# Patient Record
Sex: Female | Born: 1970 | Race: Black or African American | Hispanic: No | Marital: Married | Smoking: Never smoker
Health system: Southern US, Community
[De-identification: ages and names within clinical notes are randomized; demographics above are authoritative.]

## PROBLEM LIST (undated history)

## (undated) ENCOUNTER — Ambulatory Visit: Admission: EM | Payer: BC Managed Care – PPO | Source: Home / Self Care

## (undated) DIAGNOSIS — N7011 Chronic salpingitis: Secondary | ICD-10-CM

## (undated) DIAGNOSIS — Z8619 Personal history of other infectious and parasitic diseases: Secondary | ICD-10-CM

## (undated) DIAGNOSIS — D649 Anemia, unspecified: Secondary | ICD-10-CM

## (undated) DIAGNOSIS — R7303 Prediabetes: Secondary | ICD-10-CM

## (undated) DIAGNOSIS — Z87442 Personal history of urinary calculi: Secondary | ICD-10-CM

## (undated) DIAGNOSIS — F419 Anxiety disorder, unspecified: Secondary | ICD-10-CM

## (undated) DIAGNOSIS — N809 Endometriosis, unspecified: Secondary | ICD-10-CM

## (undated) DIAGNOSIS — E119 Type 2 diabetes mellitus without complications: Secondary | ICD-10-CM

## (undated) DIAGNOSIS — D219 Benign neoplasm of connective and other soft tissue, unspecified: Secondary | ICD-10-CM

## (undated) DIAGNOSIS — I10 Essential (primary) hypertension: Secondary | ICD-10-CM

## (undated) DIAGNOSIS — J45909 Unspecified asthma, uncomplicated: Secondary | ICD-10-CM

## (undated) DIAGNOSIS — F418 Other specified anxiety disorders: Secondary | ICD-10-CM

## (undated) DIAGNOSIS — T7840XA Allergy, unspecified, initial encounter: Secondary | ICD-10-CM

## (undated) HISTORY — DX: Anemia, unspecified: D64.9

## (undated) HISTORY — DX: Unspecified asthma, uncomplicated: J45.909

## (undated) HISTORY — PX: TONSILLECTOMY: SUR1361

## (undated) HISTORY — DX: Personal history of other infectious and parasitic diseases: Z86.19

## (undated) HISTORY — DX: Benign neoplasm of connective and other soft tissue, unspecified: D21.9

## (undated) HISTORY — DX: Essential (primary) hypertension: I10

## (undated) HISTORY — DX: Chronic salpingitis: N70.11

## (undated) HISTORY — DX: Type 2 diabetes mellitus without complications: E11.9

## (undated) HISTORY — DX: Endometriosis, unspecified: N80.9

## (undated) HISTORY — PX: POLYPECTOMY: SHX149

## (undated) HISTORY — PX: ABDOMINAL HYSTERECTOMY: SHX81

## (undated) HISTORY — DX: Anxiety disorder, unspecified: F41.9

## (undated) HISTORY — PX: TUBAL LIGATION: SHX77

## (undated) HISTORY — PX: DENTAL SURGERY: SHX609

## (undated) HISTORY — DX: Allergy, unspecified, initial encounter: T78.40XA

## (undated) HISTORY — PX: NO PAST SURGERIES: SHX2092

---

## 2006-03-17 ENCOUNTER — Ambulatory Visit: Payer: Self-pay | Admitting: Family Medicine

## 2006-04-07 ENCOUNTER — Ambulatory Visit: Payer: Self-pay | Admitting: Obstetrics and Gynecology

## 2012-11-29 ENCOUNTER — Ambulatory Visit: Payer: Self-pay | Admitting: Family Medicine

## 2012-11-30 ENCOUNTER — Emergency Department: Payer: Self-pay | Admitting: Emergency Medicine

## 2012-11-30 LAB — COMPREHENSIVE METABOLIC PANEL
Albumin: 3.4 g/dL (ref 3.4–5.0)
Alkaline Phosphatase: 100 U/L (ref 50–136)
Bilirubin,Total: 0.2 mg/dL (ref 0.2–1.0)
Calcium, Total: 8.4 mg/dL — ABNORMAL LOW (ref 8.5–10.1)
Co2: 28 mmol/L (ref 21–32)
Creatinine: 0.67 mg/dL (ref 0.60–1.30)
EGFR (Non-African Amer.): >60
Glucose: 76 mg/dL (ref 65–99)
Potassium: 3.8 mmol/L (ref 3.5–5.1)
SGOT(AST): 21 U/L (ref 15–37)
Total Protein: 7.4 g/dL (ref 6.4–8.2)

## 2012-11-30 LAB — URINALYSIS, COMPLETE
Bilirubin,UR: NEGATIVE
Blood: NEGATIVE
Nitrite: NEGATIVE
Ph: 6 (ref 4.5–8.0)
Protein: NEGATIVE
RBC,UR: 2 /HPF (ref 0–5)
Specific Gravity: 1.023 (ref 1.003–1.030)
Squamous Epithelial: 5

## 2012-11-30 LAB — CBC
HCT: 36.6 % (ref 35.0–47.0)
MCHC: 33.4 g/dL (ref 32.0–36.0)
MCV: 82 fL (ref 80–100)
Platelet: 183 10*3/uL (ref 150–440)
RDW: 14.5 % (ref 11.5–14.5)

## 2012-11-30 LAB — LIPASE, BLOOD: Lipase: 114 U/L (ref 73–393)

## 2014-01-06 ENCOUNTER — Emergency Department: Payer: Self-pay | Admitting: Emergency Medicine

## 2014-02-07 ENCOUNTER — Ambulatory Visit: Payer: Self-pay | Admitting: Family Medicine

## 2014-07-20 ENCOUNTER — Ambulatory Visit: Payer: Self-pay | Admitting: Family Medicine

## 2014-07-25 ENCOUNTER — Ambulatory Visit: Payer: Self-pay | Admitting: Family Medicine

## 2014-10-23 ENCOUNTER — Emergency Department (HOSPITAL_COMMUNITY)
Admission: EM | Admit: 2014-10-23 | Discharge: 2014-10-23 | Disposition: A | Discharge status: Home / Self Care | Payer: 59 | Source: Home / Self Care | Attending: Family Medicine | Admitting: Family Medicine

## 2014-10-23 ENCOUNTER — Encounter (HOSPITAL_COMMUNITY): Payer: Self-pay | Admitting: Emergency Medicine

## 2014-10-23 DIAGNOSIS — J301 Allergic rhinitis due to pollen: Secondary | ICD-10-CM | POA: Diagnosis not present

## 2014-10-23 LAB — POCT RAPID STREP A: Streptococcus, Group A Screen (Direct): NEGATIVE

## 2014-10-23 MED ORDER — IPRATROPIUM BROMIDE 0.06 % NA SOLN: 2.0000 | Freq: Four times a day (QID) | NASAL | Status: DC

## 2014-10-23 NOTE — Discharge Instructions (Signed)
Allergic Rhinitis Flonase nasal spray for at least 1 month Atrovent nasal spray for runny nose and sniffles Use Allegra or Zyrtec or Claritin for allergy symptoms and drainage Robitussin-DM as needed for cough Ibuprofen every 6 hours as needed for discomfort Drink plenty of fluids and stay well-hydrated Frequent use of saline nasal spray as needed. Allergic rhinitis is when the mucous membranes in the nose respond to allergens. Allergens are particles in the air that cause your body to have an allergic reaction. This causes you to release allergic antibodies. Through a chain of events, these eventually cause you to release histamine into the blood stream. Although meant to protect the body, it is this release of histamine that causes your discomfort, such as frequent sneezing, congestion, and an itchy, runny nose.  CAUSES  Seasonal allergic rhinitis (hay fever) is caused by pollen allergens that may come from grasses, trees, and weeds. Year-round allergic rhinitis (perennial allergic rhinitis) is caused by allergens such as house dust mites, pet dander, and mold spores.  SYMPTOMS   Nasal stuffiness (congestion).  Itchy, runny nose with sneezing and tearing of the eyes. DIAGNOSIS  Your health care provider can help you determine the allergen or allergens that trigger your symptoms. If you and your health care provider are unable to determine the allergen, skin or blood testing may be used. TREATMENT  Allergic rhinitis does not have a cure, but it can be controlled by:  Medicines and allergy shots (immunotherapy).  Avoiding the allergen. Hay fever may often be treated with antihistamines in pill or nasal spray forms. Antihistamines block the effects of histamine. There are over-the-counter medicines that may help with nasal congestion and swelling around the eyes. Check with your health care provider before taking or giving this medicine.  If avoiding the allergen or the medicine prescribed do  not work, there are many new medicines your health care provider can prescribe. Stronger medicine may be used if initial measures are ineffective. Desensitizing injections can be used if medicine and avoidance does not work. Desensitization is when a patient is given ongoing shots until the body becomes less sensitive to the allergen. Make sure you follow up with your health care provider if problems continue. HOME CARE INSTRUCTIONS It is not possible to completely avoid allergens, but you can reduce your symptoms by taking steps to limit your exposure to them. It helps to know exactly what you are allergic to so that you can avoid your specific triggers. SEEK MEDICAL CARE IF:   You have a fever.  You develop a cough that does not stop easily (persistent).  You have shortness of breath.  You start wheezing.  Symptoms interfere with normal daily activities. Document Released: 02/18/2001 Document Revised: 05/31/2013 Document Reviewed: 01/31/2013 [facility] Patient Information 2015 [place], [place]. This information is not intended to replace advice given to you by your health care provider. Make sure you discuss any questions you have with your health care provider.

## 2014-10-23 NOTE — ED Notes (Signed)
C/o congestion since Friday  States she has a sore throat, coughing, diarrhea x4 today, sneezing and fatigue Aleve used as tx

## 2014-10-23 NOTE — ED Provider Notes (Signed)
CSN: [id number]     Arrival date & time 10/23/14  1132 History   First MD Initiated Contact with Patient 10/23/14 1404     No chief complaint on file.  (Consider location/radiation/quality/duration/timing/severity/associated sxs/prior Treatment) HPI Comments: 44 year old female complaining of a three-day history of sore throat, PND, cough, sneezing, feeling tired and PND. She has had 4% as of diarrhea in the past 24 hours. Denies fevers.   No past medical history on file. No past surgical history on file. No family history on file. History  Substance Use Topics  . Smoking status: Not on file  . Smokeless tobacco: Not on file  . Alcohol Use: Not on file   OB History    No data available     Review of Systems  Constitutional: Positive for activity change and fatigue. Negative for fever, chills and appetite change.  HENT: Positive for congestion, postnasal drip, rhinorrhea, sneezing and sore throat. Negative for ear discharge and facial swelling.   Eyes: Negative.   Respiratory: Positive for cough. Negative for shortness of breath and wheezing.   Cardiovascular: Negative.   Gastrointestinal: Positive for diarrhea. Negative for abdominal pain.  Genitourinary: Negative.   Musculoskeletal: Negative for neck pain and neck stiffness.  Skin: Negative for rash.  Neurological: Negative.     Allergies  Review of patient's allergies indicates not on file.  Home Medications   Prior to Admission medications   Medication Sig Start Date End Date Taking? Authorizing Provider  ipratropium (ATROVENT) 0.06 % nasal spray Place 2 sprays into both nostrils 4 (four) times daily. 10/23/14   Janne Napoleon, NP   BP 128/80 mmHg  Pulse 71  Temp(Src) 98 F (36.7 C) (Oral)  Resp 16  SpO2 100% Physical Exam  Constitutional: She is oriented to person, place, and time. She appears well-developed and well-nourished. No distress.  HENT:  Bilateral TMs are normal Oropharynx with minor cobblestoning and  faint clear PND. Minimal erythema. No swelling or exudates.  Eyes: Conjunctivae and EOM are normal.  Neck: Normal range of motion. Neck supple.  Cardiovascular: Normal rate, regular rhythm and normal heart sounds.   Pulmonary/Chest: Effort normal and breath sounds normal. No respiratory distress. She has no wheezes. She has no rales.  Musculoskeletal: Normal range of motion. She exhibits no edema.  Lymphadenopathy:    She has no cervical adenopathy.  Neurological: She is alert and oriented to person, place, and time.  Skin: Skin is warm and dry. No rash noted.  Psychiatric: She has a normal mood and affect.  Nursing note and vitals reviewed.   ED Course  Procedures (including critical care time) Labs Review Labs Reviewed - No data to display  Imaging Review No results found.   MDM   1. Allergic rhinitis due to pollen    Allergic Rhinitis Flonase nasal spray for at least 1 month Atrovent nasal spray for runny nose and sniffles Use Allegra or Zyrtec or Claritin for allergy symptoms and drainage Robitussin-DM as needed for cough Ibuprofen every 6 hours as needed for discomfort Drink plenty of fluids and stay well-hydrated Frequent use of saline nasal spray as needed.      Janne Napoleon, NP 10/23/14 1428

## 2014-10-25 LAB — CULTURE, GROUP A STREP: Strep A Culture: NEGATIVE

## 2014-11-17 ENCOUNTER — Ambulatory Visit (INDEPENDENT_AMBULATORY_CARE_PROVIDER_SITE_OTHER): Payer: 59 | Admitting: Family Medicine

## 2014-11-17 ENCOUNTER — Encounter: Payer: Self-pay | Admitting: Family Medicine

## 2014-11-17 VITALS — BP 118/78 | HR 88 | Temp 98.1°F | Resp 16 | Ht 65.0 in | Wt 216.3 lb

## 2014-11-17 DIAGNOSIS — M25552 Pain in left hip: Secondary | ICD-10-CM

## 2014-11-17 DIAGNOSIS — R7309 Other abnormal glucose: Secondary | ICD-10-CM

## 2014-11-17 DIAGNOSIS — L209 Atopic dermatitis, unspecified: Secondary | ICD-10-CM | POA: Insufficient documentation

## 2014-11-17 DIAGNOSIS — F418 Other specified anxiety disorders: Secondary | ICD-10-CM | POA: Diagnosis not present

## 2014-11-17 DIAGNOSIS — R7303 Prediabetes: Secondary | ICD-10-CM | POA: Insufficient documentation

## 2014-11-17 DIAGNOSIS — M25551 Pain in right hip: Secondary | ICD-10-CM | POA: Insufficient documentation

## 2014-11-17 DIAGNOSIS — R197 Diarrhea, unspecified: Secondary | ICD-10-CM | POA: Diagnosis not present

## 2014-11-17 DIAGNOSIS — J4530 Mild persistent asthma, uncomplicated: Secondary | ICD-10-CM | POA: Diagnosis not present

## 2014-11-17 DIAGNOSIS — R7989 Other specified abnormal findings of blood chemistry: Secondary | ICD-10-CM | POA: Insufficient documentation

## 2014-11-17 MED ORDER — LOPERAMIDE HCL 2 MG PO TABS: 2.0000 mg | ORAL_TABLET | Freq: Four times a day (QID) | ORAL | Status: DC | PRN

## 2014-11-17 MED ORDER — FLUTICASONE-SALMETEROL 250-50 MCG/DOSE IN AEPB: 1.0000 | INHALATION_SPRAY | Freq: Two times a day (BID) | RESPIRATORY_TRACT | Status: DC

## 2014-11-17 MED ORDER — ALPRAZOLAM 0.5 MG PO TABS: 0.2500 mg | ORAL_TABLET | Freq: Two times a day (BID) | ORAL | Status: DC | PRN

## 2014-11-17 NOTE — Patient Instructions (Signed)

## 2014-11-17 NOTE — Progress Notes (Signed)
Name: Alisha Kim   MRN: [id number]    DOB: March 14, 1971   Date:11/17/2014       Progress Note  Subjective  Chief Complaint  Chief Complaint  Patient presents with  . Anxiety    symptoms has improved and she has doing the suggested strategies  . Diabetes    patient is pre-diabetic. patient had an episode last Thursday where her blood sugar dropped.  . Diarrhea    patient has had diarrhea for 2 weeks. Stools are really loose. Is wondering if it has something to do with her congestion/ allergies.    HPI  Gastroenteritis: Patient complains of diarrhea 3 times per day for a few weeks.  There is no report of blood in stool, dark urine, fever, melena and nausea. Patient's oral intake has been normal.  Patient's urine output has been adequate.  Other contacts with similar symptoms include no one.  Patient denies recent travel history. Patient denies recent ingestion of possible contaminated food, toxic plants, inappropriate medications/poisons.   Anxiety: Patient complains of anxiety disorder.  She has the following symptoms: none. Onset of symptoms was approximately several months ago, gradually improving since that time. She denies current suicidal and homicidal ideation. Current medications working well.  Asthma Follow-up: She has previously been evaluated here for asthma and presents for an asthma follow-up; she is not currently in exacerbation. Symptoms currently include None and occur continuously. Observed precipitants include infection and upper respiratory infection.  Current limitations in activity from asthma: none.  Number of days of school or work missed in the last month: 0. Number of Emergency Department visits in the previous month: none. Frequency of use of quick-relief meds: rarely. The patient reports adherence to this regimen    Patient Active Problem List   Diagnosis Date Noted  . Bilateral hip pain 11/17/2014  . Atopic dermatitis 11/17/2014  . Low serum vitamin D  11/17/2014    History  Substance Use Topics  . Smoking status: Never Smoker   . Smokeless tobacco: Not on file  . Alcohol Use: 0.0 oz/week    0 Standard drinks or equivalent per week     Comment: seldom use     Current outpatient prescriptions:  .  albuterol (PROAIR HFA) 108 (90 BASE) MCG/ACT inhaler, Inhale into the lungs., Disp: , Rfl:  .  escitalopram (LEXAPRO) 10 MG tablet, Take by mouth., Disp: , Rfl:  .  fluticasone-salmeterol (ADVAIR HFA) 115-21 MCG/ACT inhaler, Inhale 2 puffs into the lungs 2 (two) times daily., Disp: , Rfl:  .  HYDROcodone-homatropine (HYCODAN) 5-1.5 MG/5ML syrup, Take by mouth., Disp: , Rfl:  .  LISINOPRIL PO, Take by mouth., Disp: , Rfl:  .  lisinopril-hydrochlorothiazide (PRINZIDE,ZESTORETIC) 20-12.5 MG per tablet, Take by mouth., Disp: , Rfl:  .  Montelukast Sodium (SINGULAIR PO), Take by mouth., Disp: , Rfl:  .  fluticasone (FLONASE) 50 MCG/ACT nasal spray, INHALE 2 SPRAY BY INTRANASAL ROUTE EVERY DAY IN EACH NOSTRIL, Disp: , Rfl: 0 .  ipratropium (ATROVENT) 0.06 % nasal spray, Place 2 sprays into both nostrils 4 (four) times daily. (Patient not taking: Reported on 11/17/2014), Disp: 15 mL, Rfl: 12  History reviewed. No pertinent past surgical history.  Family History  Problem Relation Age of Onset  . Alcohol abuse Mother   . Arthritis Mother   . Asthma Mother   . Depression Mother   . Drug abuse Mother   . Hypertension Mother   . Mental illness Mother   . Alcohol abuse Father   .  Drug abuse Father   . Hypertension Brother   . Hearing loss Maternal Aunt   . Hypertension Maternal Aunt   . Stroke Maternal Aunt   . Cancer Maternal Grandmother   . Heart disease Maternal Grandmother   . Hypertension Maternal Grandmother     Allergies  Allergen Reactions  . Psudatabs [Pseudoephedrine Hcl] Swelling  . Zithromax [Azithromycin] Swelling    Tongue   . Shellfish Allergy Rash     Review of Systems  Ten systems reviewed and is negative except  as mentioned in HPI.    Objective  BP 118/78 mmHg  Pulse 88  Temp(Src) 98.1 F (36.7 C) (Oral)  Resp 16  Ht 5\' 5"  (1.651 m)  Wt 216 lb 4.8 oz (98.113 kg)  BMI 35.99 kg/m2  SpO2 97%  LMP 11/13/2014 (Approximate)  Physical Exam  Constitutional: Patient appears obese and well-nourished. In no distress.  HEENT:  - Head: Normocephalic and atraumatic.  - Ears: Bilateral TMs gray, no erythema or effusion - Nose: Nasal mucosa moist - Mouth/Throat: Oropharynx is clear and moist. No tonsillar hypertrophy or erythema. Yes post nasal drainage.  - Eyes: Conjunctivae clear, EOM movements normal. PERRLA. No scleral icterus.  Neck: Normal range of motion. Neck supple. No JVD present. No thyromegaly present.  Cardiovascular: Normal rate, regular rhythm and normal heart sounds.  No murmur heard.  Pulmonary/Chest: Effort normal and breath sounds normal. No respiratory distress. Musculoskeletal: Normal range of motion bilateral UE and LE, no joint effusions. Peripheral vascular: Bilateral LE no edema. Abdomen: Soft, NT/ND, Positive increased BS general. Skin: Skin is warm and dry. No rash noted. No erythema.  Psychiatric: Patient has a normal mood and affect. Behavior is normal in office today. Judgment and thought content normal in office today.   Recent Results (from the past 2160 hour(s))  POCT rapid strep A [facility])     Status: None   Collection Time: 10/23/14  2:21 PM  Result Value Ref Range   Streptococcus, Group A Screen (Direct) NEGATIVE NEGATIVE  Culture, Group A Strep     Status: None   Collection Time: 10/23/14  2:22 PM  Result Value Ref Range   Strep A Culture Negative     Comment: (NOTE) Performed At: [facility] [address] [place], [place] [id number] Lindon Romp MD MP:[id number]      Assessment & Plan  1. Depression with anxiety Continue current medications. Did not establish with psychologist but did get to seek counseling through  church.  - ALPRAZolam (XANAX) 0.5 MG tablet; Take 0.5 tablets (0.25 mg total) by mouth 2 (two) times daily as needed for anxiety.  Dispense: 60 tablet; Refill: 2  2. Diarrhea Likely viral in etiology. If persists will get stool studies.  - loperamide (IMODIUM A-D) 2 MG tablet; Take 1 tablet (2 mg total) by mouth 4 (four) times daily as needed for diarrhea or loose stools.  Dispense: 30 tablet; Refill: 0  3. Pre-diabetes Eat at regular intervals.  4.) Asthma, Mild Persistent Refilled Advair

## 2015-01-21 ENCOUNTER — Other Ambulatory Visit: Payer: Self-pay | Admitting: Family Medicine

## 2015-01-21 DIAGNOSIS — J309 Allergic rhinitis, unspecified: Secondary | ICD-10-CM

## 2015-01-21 DIAGNOSIS — R0982 Postnasal drip: Principal | ICD-10-CM

## 2015-02-16 ENCOUNTER — Other Ambulatory Visit: Payer: Self-pay | Admitting: Family Medicine

## 2015-02-16 DIAGNOSIS — R0982 Postnasal drip: Principal | ICD-10-CM

## 2015-02-16 DIAGNOSIS — J309 Allergic rhinitis, unspecified: Secondary | ICD-10-CM

## 2015-02-16 DIAGNOSIS — I10 Essential (primary) hypertension: Secondary | ICD-10-CM

## 2015-02-16 NOTE — Telephone Encounter (Signed)
Pt needs refill on Singulair and Lisinopril. Pt also wants to know if we can get her prescriptions transferred from [address] To [facility]. She states her [facility] insurance denied her meds due to her not using our pharmacy.Pt is going to try and get them transferred on her own but was not sure if she will be able to.

## 2015-02-16 NOTE — Telephone Encounter (Signed)
Refill request was sent to Dr. Ashany Sundaram for approval and submission.  

## 2015-02-17 MED ORDER — MONTELUKAST SODIUM 10 MG PO TABS
10.0000 mg | ORAL_TABLET | Freq: Every day | ORAL | Status: DC
Start: 2015-02-17 — End: 2015-07-24

## 2015-02-17 MED ORDER — LISINOPRIL-HYDROCHLOROTHIAZIDE 20-12.5 MG PO TABS: 1.0000 | ORAL_TABLET | Freq: Every day | ORAL | Status: DC

## 2015-03-15 ENCOUNTER — Encounter: Payer: Self-pay | Admitting: Physician Assistant

## 2015-03-15 ENCOUNTER — Ambulatory Visit: Payer: Self-pay | Admitting: Physician Assistant

## 2015-03-15 VITALS — BP 120/76 | HR 96 | Temp 98.6°F | Wt 226.0 lb

## 2015-03-15 DIAGNOSIS — J209 Acute bronchitis, unspecified: Secondary | ICD-10-CM

## 2015-03-15 MED ORDER — IPRATROPIUM-ALBUTEROL 0.5-2.5 (3) MG/3ML IN SOLN: 3.0000 mL | Freq: Once | RESPIRATORY_TRACT | Status: DC

## 2015-03-15 MED ORDER — CEFDINIR 300 MG PO CAPS: 300.0000 mg | ORAL_CAPSULE | Freq: Two times a day (BID) | ORAL | Status: DC

## 2015-03-15 MED ORDER — METHYLPREDNISOLONE 4 MG PO TBPK: ORAL_TABLET | ORAL | Status: DC

## 2015-03-15 MED ORDER — FLUCONAZOLE 150 MG PO TABS: 150.0000 mg | ORAL_TABLET | Freq: Once | ORAL | Status: DC

## 2015-03-15 NOTE — Progress Notes (Signed)
S: C/o cough and congestion with wheezing and chest pain/soreness;  chest is sore from coughing, had low grade fever, chills, and bodyaches, cough is dry and hacking; keeping pt awake at night;  denies cardiac type chest pain or sob, v/d, abd pain Remainder ros neg  O: vitals wnl, nad, tms clear, throat injected, neck supple no lymph, lungs with decreased bs b/l, cv rrr, neuro intact; svn duoneb , lungs c t a, cough is gone, pt states she feels much better  A:  Acute bronchitis   P:  rx medication: omnicef 300mg  bid x 10d, medrol dose pack, diflucan 150mg ; tussionex 143ml 63ml q 12h prn cough, nr ; use otc meds, tylenol or motrin as needed for fever/chills, return if not better in 3 -5 days, return earlier if worsening or go to acute care/ER; work note given for patient to work from home until has not had a fever for 24-48h and cough has calmed

## 2015-03-21 ENCOUNTER — Encounter: Payer: Self-pay | Admitting: Physician Assistant

## 2015-05-07 ENCOUNTER — Ambulatory Visit: Payer: Self-pay | Admitting: Physician Assistant

## 2015-05-07 ENCOUNTER — Encounter: Payer: Self-pay | Admitting: Physician Assistant

## 2015-05-07 VITALS — BP 120/72 | HR 76 | Temp 98.4°F

## 2015-05-07 DIAGNOSIS — B349 Viral infection, unspecified: Secondary | ICD-10-CM

## 2015-05-07 DIAGNOSIS — R197 Diarrhea, unspecified: Secondary | ICD-10-CM

## 2015-05-07 NOTE — Progress Notes (Signed)
S:  Pt c/o coughing and diarrhea, some bodyaches and sneezing; cold sx for 4 day, diarrhea last night and today, approx 5 times today;  no fever/chills, no abd pain except for cramping with diarrhea; denies cp/sob, denies camping, bad food, recent antibiotics, or exposure to bad water Remainder ros neg  O:  Vitals wnl, nad, ENT wnl, neck supple no lymph, lungs c t a, cv rrr, abd soft nontender bs normal all 4 quads, neuro intact  A:  Viral gastroenteritis  P:  Reassurance, fluids, brat diet, immodium ad for diarrhea if needed, return if not better in 3 days, return earlier if worsening

## 2015-06-11 DIAGNOSIS — H5213 Myopia, bilateral: Secondary | ICD-10-CM | POA: Diagnosis not present

## 2015-07-09 ENCOUNTER — Other Ambulatory Visit: Payer: Self-pay | Admitting: Family Medicine

## 2015-07-11 MED FILL — ALPRAZolam 0.5 MG TABS: 0.5 | 60 days supply | Qty: 60 | Fill #0 | Status: TO

## 2015-07-24 ENCOUNTER — Encounter: Payer: Self-pay | Admitting: Family Medicine

## 2015-07-24 ENCOUNTER — Ambulatory Visit (INDEPENDENT_AMBULATORY_CARE_PROVIDER_SITE_OTHER): Payer: 59 | Admitting: Family Medicine

## 2015-07-24 VITALS — BP 118/64 | HR 108 | Temp 98.0°F | Resp 14 | Wt 236.7 lb

## 2015-07-24 DIAGNOSIS — Z136 Encounter for screening for cardiovascular disorders: Secondary | ICD-10-CM | POA: Diagnosis not present

## 2015-07-24 DIAGNOSIS — I10 Essential (primary) hypertension: Secondary | ICD-10-CM | POA: Insufficient documentation

## 2015-07-24 DIAGNOSIS — J4530 Mild persistent asthma, uncomplicated: Secondary | ICD-10-CM

## 2015-07-24 DIAGNOSIS — R05 Cough: Secondary | ICD-10-CM

## 2015-07-24 DIAGNOSIS — R7303 Prediabetes: Secondary | ICD-10-CM | POA: Diagnosis not present

## 2015-07-24 DIAGNOSIS — Z0000 Encounter for general adult medical examination without abnormal findings: Secondary | ICD-10-CM | POA: Diagnosis not present

## 2015-07-24 DIAGNOSIS — IMO0001 Reserved for inherently not codable concepts without codable children: Secondary | ICD-10-CM | POA: Insufficient documentation

## 2015-07-24 DIAGNOSIS — Z1231 Encounter for screening mammogram for malignant neoplasm of breast: Secondary | ICD-10-CM | POA: Diagnosis not present

## 2015-07-24 DIAGNOSIS — Z13220 Encounter for screening for lipoid disorders: Secondary | ICD-10-CM

## 2015-07-24 DIAGNOSIS — Z124 Encounter for screening for malignant neoplasm of cervix: Secondary | ICD-10-CM | POA: Diagnosis not present

## 2015-07-24 DIAGNOSIS — Z113 Encounter for screening for infections with a predominantly sexual mode of transmission: Secondary | ICD-10-CM | POA: Insufficient documentation

## 2015-07-24 DIAGNOSIS — R059 Cough, unspecified: Secondary | ICD-10-CM | POA: Insufficient documentation

## 2015-07-24 MED ORDER — FLUTICASONE-SALMETEROL 250-50 MCG/DOSE IN AEPB: 1.0000 | INHALATION_SPRAY | Freq: Two times a day (BID) | RESPIRATORY_TRACT | Status: DC

## 2015-07-24 MED ORDER — ALBUTEROL SULFATE HFA 108 (90 BASE) MCG/ACT IN AERS: 1.0000 | INHALATION_SPRAY | Freq: Four times a day (QID) | RESPIRATORY_TRACT | Status: DC | PRN

## 2015-07-24 MED ORDER — MONTELUKAST SODIUM 10 MG PO TABS: 10.0000 mg | ORAL_TABLET | Freq: Every day | ORAL | Status: DC

## 2015-07-24 MED ORDER — HYDROCOD POLST-CPM POLST ER 10-8 MG/5ML PO SUER: 5.0000 mL | Freq: Two times a day (BID) | ORAL | Status: DC | PRN

## 2015-07-24 NOTE — Progress Notes (Signed)
Name: Alisha Kim   MRN: [id number]    DOB: [birth year]   Date:07/24/2015       Progress Note  Subjective  Chief Complaint  Chief Complaint  Patient presents with  . Annual Exam  . Asthma    Cough, wheezing, SOB  . Hypertension    HPI  Patient is here today for a Complete Female Physical Exam:  The patient reports having some on/off swelling in bilateral lower extremities and right hand making her ring tight (not today). Overall feels health needs are stable. Diet is well balanced, large portions, not enough water. In general does not exercise regularly. Sees dentist regularly and addresses vision concerns with ophthalmologist if applicable. In regards to sexual activity the patient is not currently sexually active. Currently is not concerned about exposure to any STDs.   Asthma Follow-up: She has previously been evaluated here for asthma and presents for an asthma follow-up; she is not currently in exacerbation but does report a nagging cough that keeps her up at night. Associated with scant white sputum production. Has run out of her Advair recently.. Observed precipitants include infection and upper respiratory infection. Current limitations in activity from asthma: none. Number of days of school or work missed in the last month: 0. Number of Emergency Department visits in the previous month: none. Frequency of use of quick-relief meds: rarely. The patient reports adherence to this regimen.   Patient is here for routine follow up of Hypertension. First diagnosed with hypertension several years ago. Current anti-hypertension medication regimen includes dietary modification, weight management and Lisinopril-HCTZ 20-12.5 mg one a day.  Patient is following physician recommended management. Not checking blood pressure outside of physician office. Associated symptoms do not include headache, dizziness, nausea, lower extremity swelling, worsening shortness of breath, chest pain,  numbness.  Past Medical History  Diagnosis Date  . Allergy   . Anemia     history of  . Anxiety   . Asthma   . Diabetes mellitus without complication ([place])     was told she was pre-diabetic  . Hypertension     No past surgical history on file.  Family History  Problem Relation Age of Onset  . Alcohol abuse Mother   . Arthritis Mother   . Asthma Mother   . Depression Mother   . Drug abuse Mother   . Hypertension Mother   . Mental illness Mother   . Alcohol abuse Father   . Drug abuse Father   . Hypertension Brother   . Hearing loss Maternal Aunt   . Hypertension Maternal Aunt   . Stroke Maternal Aunt   . Cancer Maternal Grandmother   . Heart disease Maternal Grandmother   . Hypertension Maternal Grandmother     Social History   Social History  . Marital Status: Married    Spouse Name: N/A  . Number of Children: N/A  . Years of Education: N/A   Occupational History  . Not on file.   Social History Main Topics  . Smoking status: Never Smoker   . Smokeless tobacco: Not on file  . Alcohol Use: 0.0 oz/week    0 Standard drinks or equivalent per week     Comment: seldom use  . Drug Use: No  . Sexual Activity:    Partners: Male   Other Topics Concern  . Not on file   Social History Narrative     Current outpatient prescriptions:  .  albuterol (PROAIR HFA) 108 (90 BASE) MCG/ACT inhaler,  Inhale into the lungs., Disp: , Rfl:  .  ALPRAZolam (XANAX) 0.5 MG tablet, TAKE 1/2 TABLET BY MOUTH TWICE DAILY AS NEEDED FOR ANXIETY, Disp: 60 tablet, Rfl: 3 .  Fluticasone-Salmeterol (ADVAIR DISKUS) 250-50 MCG/DOSE AEPB, Inhale 1 puff into the lungs 2 (two) times daily., Disp: 60 each, Rfl: 2 .  lisinopril-hydrochlorothiazide (PRINZIDE,ZESTORETIC) 20-12.5 MG per tablet, Take 1 tablet by mouth daily., Disp: 90 tablet, Rfl: 3 .  montelukast (SINGULAIR) 10 MG tablet, Take 1 tablet (10 mg total) by mouth daily., Disp: 90 tablet, Rfl: 3  Current facility-administered  medications:  .  ipratropium-albuterol (DUONEB) 0.5-2.5 (3) MG/3ML nebulizer solution 3 mL, 3 mL, Nebulization, Once, Versie Starks, PA-C  Allergies  Allergen Reactions  . Zithromax [Azithromycin] Swelling    Tongue   . Pseudoephedrine-Guaifenesin Swelling    Of tongue  . Shellfish Allergy Rash    ROS  CONSTITUTIONAL: No significant weight changes, fever, chills, weakness or fatigue.  HEENT:  - Eyes: No visual changes.  - Ears: No auditory changes. No pain.  - Nose: No sneezing, congestion, runny nose. - Throat: No sore throat. No changes in swallowing. SKIN: No rash or itching.  CARDIOVASCULAR: No chest pain, chest pressure or chest discomfort. No palpitations or edema.  RESPIRATORY: No shortness of breath. Yes cough GASTROINTESTINAL: No anorexia, nausea, vomiting. No changes in bowel habits. No abdominal pain or blood.  GENITOURINARY: No dysuria. No frequency. No discharge.  NEUROLOGICAL: No headache, dizziness, syncope, paralysis, ataxia, numbness or tingling in the extremities. No memory changes. No change in bowel or bladder control.  MUSCULOSKELETAL: No joint pain. No muscle pain. HEMATOLOGIC: No anemia, bleeding or bruising.  LYMPHATICS: No enlarged lymph nodes.  PSYCHIATRIC: No change in mood. No change in sleep pattern.  ENDOCRINOLOGIC: No reports of sweating, cold or heat intolerance. No polyuria or polydipsia.   Objective  Filed Vitals:   07/24/15 1402  BP: 118/64  Pulse: 108  Temp: 98 F (36.7 C)  TempSrc: Oral  Resp: 14  Weight: 236 lb 11.2 oz (107.366 kg)  SpO2: 95%   Body mass index is 39.39 kg/(m^2).  No exam data present  No results found for this or any previous visit (from the past 2160 hour(s)).  Physical Exam  Constitutional: Patient is obese and well-nourished. In no distress.  HEENT:  - Head: Normocephalic and atraumatic.  - Ears: Bilateral TMs gray, no erythema or effusion - Nose: Nasal mucosa moist - Mouth/Throat: Oropharynx is clear  and moist. No tonsillar hypertrophy or erythema. No post nasal drainage.  - Eyes: Conjunctivae clear, EOM movements normal. PERRLA. No scleral icterus.  Neck: Normal range of motion. Neck supple. No JVD present. No thyromegaly present.  Cardiovascular: Normal rate, regular rhythm and normal heart sounds.  No murmur heard.  Pulmonary/Chest: Effort normal and breath sounds normal. No respiratory distress. Abdominal: Soft. Bowel sounds are normal, no distension. There is no tenderness. no masses BREAST: Bilateral breast exam normal with no masses, skin changes or nipple discharge FEMALE GENITALIA:  External genitalia normal External urethra normal Vaginal vault normal without discharge or lesions Cervix normal without discharge or lesions Bimanual exam normal without masses RECTAL: no rectal masses or hemorrhoids Musculoskeletal: Normal range of motion bilateral UE and LE, no joint effusions. Peripheral vascular: Bilateral LE no edema. Neurological: CN II-XII grossly intact with no focal deficits. Alert and oriented to person, place, and time. Coordination, balance, strength, speech and gait are normal.  Skin: Skin is warm and dry. No rash noted. No  erythema.  Psychiatric: Patient has a normal mood and affect. Behavior is normal in office today. Judgment and thought content normal in office today.   Assessment & Plan  1. Annual physical exam Discussed in detail all recommended preventative measures appropriate for age and gender now and in the future.  2. Hypertension, goal below 140/90 Well controled  - CBC with Differential/Platelet - Comprehensive metabolic panel - Lipid panel - TSH  3. Pre-diabetes  - Hemoglobin A1c - Lipid panel - TSH  4. Encounter for screening mammogram for malignant neoplasm of breast  - MM Digital Screening; Future  5. Encounter for screening for malignant neoplasm of cervix Difficult obtaining specimen as speculum's we have did not open longer  vaginal vault enough to reach cervix adequately. Near blind specimen obtained.  - Pap IG w/ reflex to HPV when ASC-U  6. Mild persistent asthma with allergic rhinitis without complication No exacerbation today, needs to get back on Advair.  - Fluticasone-Salmeterol (ADVAIR DISKUS) 250-50 MCG/DOSE AEPB; Inhale 1 puff into the lungs 2 (two) times daily.  Dispense: 60 each; Refill: 5 - albuterol (PROAIR HFA) 108 (90 Base) MCG/ACT inhaler; Inhale 1-2 puffs into the lungs every 6 (six) hours as needed for wheezing or shortness of breath.  Dispense: 18 g; Refill: 5 - montelukast (SINGULAIR) 10 MG tablet; Take 1 tablet (10 mg total) by mouth daily.  Dispense: 90 tablet; Refill: 2  7. Cough Likely due to URI, treat symptomatically.  - chlorpheniramine-HYDROcodone (TUSSIONEX PENNKINETIC ER) 10-8 MG/5ML SUER; Take 5 mLs by mouth every 12 (twelve) hours as needed.  Dispense: 115 mL; Refill: 0  8. Screening for STD (sexually transmitted disease)  - HIV antibody  9. Encounter for cholesteral screening for cardiovascular disease  - Lipid panel

## 2015-07-25 DIAGNOSIS — Z124 Encounter for screening for malignant neoplasm of cervix: Secondary | ICD-10-CM | POA: Diagnosis not present

## 2015-07-28 LAB — PAP IG W/ RFLX HPV ASCU: PAP Smear Comment: 0

## 2015-09-08 DIAGNOSIS — N800 Endometriosis of uterus: Secondary | ICD-10-CM

## 2015-09-08 DIAGNOSIS — N8003 Adenomyosis of the uterus: Secondary | ICD-10-CM

## 2015-09-08 HISTORY — DX: Endometriosis of uterus: N80.0

## 2015-09-08 HISTORY — DX: Adenomyosis of the uterus: N80.03

## 2015-10-21 ENCOUNTER — Emergency Department
Admission: EM | Admit: 2015-10-21 | Discharge: 2015-10-21 | Disposition: A | Discharge status: Home / Self Care | Payer: 59 | Attending: Emergency Medicine | Admitting: Emergency Medicine

## 2015-10-21 ENCOUNTER — Emergency Department: Payer: 59

## 2015-10-21 ENCOUNTER — Encounter: Payer: Self-pay | Admitting: Urgent Care

## 2015-10-21 DIAGNOSIS — F329 Major depressive disorder, single episode, unspecified: Secondary | ICD-10-CM | POA: Insufficient documentation

## 2015-10-21 DIAGNOSIS — M5431 Sciatica, right side: Secondary | ICD-10-CM

## 2015-10-21 DIAGNOSIS — M545 Low back pain: Secondary | ICD-10-CM | POA: Diagnosis present

## 2015-10-21 DIAGNOSIS — M79604 Pain in right leg: Secondary | ICD-10-CM | POA: Diagnosis not present

## 2015-10-21 DIAGNOSIS — J45909 Unspecified asthma, uncomplicated: Secondary | ICD-10-CM | POA: Diagnosis not present

## 2015-10-21 DIAGNOSIS — M5441 Lumbago with sciatica, right side: Secondary | ICD-10-CM | POA: Diagnosis not present

## 2015-10-21 DIAGNOSIS — I10 Essential (primary) hypertension: Secondary | ICD-10-CM | POA: Diagnosis not present

## 2015-10-21 DIAGNOSIS — E119 Type 2 diabetes mellitus without complications: Secondary | ICD-10-CM | POA: Diagnosis not present

## 2015-10-21 DIAGNOSIS — Z79899 Other long term (current) drug therapy: Secondary | ICD-10-CM | POA: Diagnosis not present

## 2015-10-21 DIAGNOSIS — Z91013 Allergy to seafood: Secondary | ICD-10-CM | POA: Insufficient documentation

## 2015-10-21 MED ORDER — TRAMADOL HCL 50 MG PO TABS: 50.0000 mg | ORAL_TABLET | Freq: Four times a day (QID) | ORAL | Status: DC | PRN

## 2015-10-21 MED ORDER — HYDROCODONE-ACETAMINOPHEN 5-325 MG PO TABS
2.0000 | ORAL_TABLET | Freq: Once | ORAL | Status: AC
  Administered 2015-10-21: 2 via ORAL
  Filled 2015-10-21: qty 2

## 2015-10-21 MED ORDER — PREDNISONE 20 MG PO TABS
40.0000 mg | ORAL_TABLET | Freq: Once | ORAL | Status: AC
  Administered 2015-10-21: 40 mg via ORAL

## 2015-10-21 MED ORDER — TRAMADOL HCL 50 MG PO TABS
100.0000 mg | ORAL_TABLET | Freq: Once | ORAL | Status: AC
  Administered 2015-10-21: 100 mg via ORAL
  Filled 2015-10-21: qty 2

## 2015-10-21 MED ORDER — PREDNISONE 20 MG PO TABS
ORAL_TABLET | ORAL | Status: AC
Start: 2015-10-21 — End: 2015-10-21
  Administered 2015-10-21: 40 mg via ORAL
  Filled 2015-10-21: qty 2

## 2015-10-21 MED ORDER — PREDNISONE 20 MG PO TABS: 40.0000 mg | ORAL_TABLET | Freq: Every day | ORAL | Status: DC

## 2015-10-21 NOTE — ED Notes (Signed)
Patient transported to Ultrasound 

## 2015-10-21 NOTE — ED Notes (Signed)
MD Paduchowski at bedside  

## 2015-10-21 NOTE — ED Provider Notes (Signed)
[facility] Emergency Department Provider Note  Time seen: 1:18 AM  I have reviewed the triage vital signs and the nursing notes.   HISTORY  Chief Complaint Back Pain    HPI Alisha Kim is a 45 y.o. female with a past medical history of anxiety, asthma, diabetes, hypertension presents the emergency department with lower back pain. According to the patient she was wearing high heels several days ago when she miss stepped and felt the pain go down her right leg. Since that time she states progressively worsening pain in the right lower back going down the right leg. Currently describes her pain as an 8/10 and describes as a sharp shooting sensation. With a dull pain in the lower back. Patient states a tingling sensation at times in the leg, denies any numbness or tingling currently. Denies any weakness. She states she feels like the right leg is swollen.     Past Medical History  Diagnosis Date  . Allergy   . Anemia     history of  . Anxiety   . Asthma   . Diabetes mellitus without complication ([place])     was told she was pre-diabetic  . Hypertension     Patient Active Problem List   Diagnosis Date Noted  . Annual physical exam 07/24/2015  . Hypertension, goal below 140/90 07/24/2015  . Encounter for screening mammogram for malignant neoplasm of breast 07/24/2015  . Encounter for screening for malignant neoplasm of cervix 07/24/2015  . Cough 07/24/2015  . Screening for STD (sexually transmitted disease) 07/24/2015  . Encounter for cholesteral screening for cardiovascular disease 07/24/2015  . Bilateral hip pain 11/17/2014  . Atopic dermatitis 11/17/2014  . Low serum vitamin D 11/17/2014  . Pre-diabetes 11/17/2014  . Depression with anxiety 11/17/2014  . Mild persistent asthma with allergic rhinitis without complication [id number]    History reviewed. No pertinent past surgical history.  Current Outpatient Rx  Name  Route  Sig  Dispense   Refill  . albuterol (PROAIR HFA) 108 (90 Base) MCG/ACT inhaler   Inhalation   Inhale 1-2 puffs into the lungs every 6 (six) hours as needed for wheezing or shortness of breath.   18 g   5   . ALPRAZolam (XANAX) 0.5 MG tablet      TAKE 1/2 TABLET BY MOUTH TWICE DAILY AS NEEDED FOR ANXIETY   60 tablet   3     Printed 07/11/15   . chlorpheniramine-HYDROcodone (TUSSIONEX PENNKINETIC ER) 10-8 MG/5ML SUER   Oral   Take 5 mLs by mouth every 12 (twelve) hours as needed.   115 mL   0   . Fluticasone-Salmeterol (ADVAIR DISKUS) 250-50 MCG/DOSE AEPB   Inhalation   Inhale 1 puff into the lungs 2 (two) times daily.   60 each   5   . lisinopril-hydrochlorothiazide (PRINZIDE,ZESTORETIC) 20-12.5 MG per tablet   Oral   Take 1 tablet by mouth daily.   90 tablet   3   . montelukast (SINGULAIR) 10 MG tablet   Oral   Take 1 tablet (10 mg total) by mouth daily.   90 tablet   2     Allergies Zithromax; Pseudoephedrine-guaifenesin; and Shellfish allergy  Family History  Problem Relation Age of Onset  . Alcohol abuse Mother   . Arthritis Mother   . Asthma Mother   . Depression Mother   . Drug abuse Mother   . Hypertension Mother   . Mental illness Mother   . Alcohol  abuse Father   . Drug abuse Father   . Hypertension Brother   . Hearing loss Maternal Aunt   . Hypertension Maternal Aunt   . Stroke Maternal Aunt   . Cancer Maternal Grandmother   . Heart disease Maternal Grandmother   . Hypertension Maternal Grandmother     Social History Social History  Substance Use Topics  . Smoking status: Never Smoker   . Smokeless tobacco: None  . Alcohol Use: 0.0 oz/week    0 Standard drinks or equivalent per week     Comment: seldom use    Review of Systems Constitutional: Negative for fever Cardiovascular: Negative for chest pain. Respiratory: Negative for shortness of breath. Gastrointestinal: Negative for abdominal pain Musculoskeletal: Right lower extremity discomfort and  swelling. Positive for right lower back pain. Neurological: Negative for headache 10-point ROS otherwise negative.  ____________________________________________   PHYSICAL EXAM:  VITAL SIGNS: ED Triage Vitals  Enc Vitals Group     BP 10/21/15 0100 141/86 mmHg     Pulse Rate 10/21/15 0100 83     Resp 10/21/15 0100 20     Temp 10/21/15 0100 97.9 F (36.6 C)     Temp Source 10/21/15 0100 Oral     SpO2 10/21/15 0100 100 %     Weight 10/21/15 0100 230 lb (104.327 kg)     Height 10/21/15 0100 5\' 6"  (1.676 m)     Head Cir --      Peak Flow --      Pain Score 10/21/15 0101 8     Pain Loc --      Pain Edu? --      Excl. in [place]? --     Constitutional: Alert and oriented. Well appearing and in no distress. Eyes: Normal exam ENT   Head: Normocephalic and atraumatic.   Mouth/Throat: Mucous membranes are moist. Cardiovascular: Normal rate, regular rhythm. No murmur Respiratory: Normal respiratory effort without tachypnea nor retractions. Breath sounds are clear  Gastrointestinal: Soft and nontender. No distention.   Musculoskeletal: Positive for lower back tenderness to palpation especially in the right paraspinal area. No appreciable right lower extremity swelling. Neurologic:  Normal speech and language. No gross focal neurologic deficits. Neurovascularly intact. 2+ DP pulse in right lower extremity. Sensation is equal in bilateral lower extremities. Skin:  Skin is warm, dry and intact.  Psychiatric: Mood and affect are normal.   ____________________________________________     RADIOLOGY  Ultrasound negative for DVT.  ____________________________________________    INITIAL IMPRESSION / ASSESSMENT AND PLAN / ED COURSE  Pertinent labs & imaging results that were available during my care of the patient were reviewed by me and considered in my medical decision making (see chart for details).  Patient presents the emergency department with symptoms most consistent with  sciatica affecting her right side. We will dose Ultram. Patient states her main concern is she believes the right lower extremity is swollen and wants to make sure there is no blood clot. There is no appreciable swelling on exam although the patient does have mild Tenderness to palpation on the right. We will proceed with an ultrasound of the right lower extremity to exclude DVT. We will dose Ultram for pain control. If DVT is normal patient will be discharged with pain medication as well as a burst course of steroids.  Ultrasound negative for DVT. Patient states her pain is decreased to a 4/10, does not wish for anything stronger. We'll discharge with Ultram and steroids for likely sciatica. We  will provide orthopedic follow-up information if the pain is not improved in 5-7 days. I discussed return precautions with the patient which she is agreeable.  ____________________________________________   FINAL CLINICAL IMPRESSION(S) / ED DIAGNOSES  Lower back pain Right-sided sciatica   Harvest Dark, MD 10/21/15 [phone]

## 2015-10-21 NOTE — Discharge Instructions (Signed)

## 2015-10-21 NOTE — ED Notes (Signed)
  Reviewed d/c instructions, follow-up care, and prescriptions with pt. Pt verbalized understanding 

## 2015-10-21 NOTE — ED Notes (Signed)
Pt c/o of lower right back pain radiating down leg to knee. Pt reports wearing high heels on Wednesday, stepping down off the porch step and feeling something pop in her knee. Pt reports pain has been getting progressively worse since. Pt rates pain at 8 out of 10 described as shooting. Pt reports numbness in right foot earlier today, denies any current numbness/tingling.

## 2016-01-07 DIAGNOSIS — H8309 Labyrinthitis, unspecified ear: Secondary | ICD-10-CM | POA: Diagnosis not present

## 2016-03-20 DIAGNOSIS — J101 Influenza due to other identified influenza virus with other respiratory manifestations: Secondary | ICD-10-CM | POA: Diagnosis not present

## 2016-03-20 DIAGNOSIS — J069 Acute upper respiratory infection, unspecified: Secondary | ICD-10-CM | POA: Diagnosis not present

## 2016-03-20 DIAGNOSIS — R197 Diarrhea, unspecified: Secondary | ICD-10-CM | POA: Diagnosis not present

## 2016-03-24 ENCOUNTER — Other Ambulatory Visit: Payer: Self-pay

## 2016-03-24 DIAGNOSIS — I10 Essential (primary) hypertension: Secondary | ICD-10-CM

## 2016-03-24 MED ORDER — AMLODIPINE BESYLATE 5 MG PO TABS: 5.0000 mg | ORAL_TABLET | Freq: Every day | ORAL | 0 refills | Status: DC

## 2016-03-24 NOTE — Telephone Encounter (Signed)
Xanax is denied Patient needs an appt to discuss any controlled substances In regards to the BP medicine, I don't see that she had the labs done that were ordered in February 2017; there are no lab results for electrolytes or kidney function going back to November 08, 2014 Her electrolyte panel done June 2014 was abnormal I'm not comfortable prescribing the ACE-I/thiazide I'll send in Rx for a medicine that doesn't request lab testing until she can be seen for an appointment and lab work Please have her use the new amlodipine for blood pressure instead of her old BP medicine and we will see her soon Thank you

## 2016-03-25 NOTE — Telephone Encounter (Signed)
Left detailed voicemail

## 2016-04-04 ENCOUNTER — Encounter: Payer: Self-pay | Admitting: Family Medicine

## 2016-04-04 ENCOUNTER — Ambulatory Visit (INDEPENDENT_AMBULATORY_CARE_PROVIDER_SITE_OTHER): Payer: 59 | Admitting: Family Medicine

## 2016-04-04 ENCOUNTER — Ambulatory Visit
Admission: RE | Admit: 2016-04-04 | Discharge: 2016-04-04 | Disposition: A | Discharge status: Home / Self Care | Payer: 59 | Source: Ambulatory Visit | Attending: Family Medicine | Admitting: Family Medicine

## 2016-04-04 DIAGNOSIS — I10 Essential (primary) hypertension: Secondary | ICD-10-CM | POA: Diagnosis not present

## 2016-04-04 DIAGNOSIS — M79672 Pain in left foot: Secondary | ICD-10-CM | POA: Insufficient documentation

## 2016-04-04 DIAGNOSIS — Z6836 Body mass index (BMI) 36.0-36.9, adult: Secondary | ICD-10-CM | POA: Diagnosis not present

## 2016-04-04 DIAGNOSIS — M79671 Pain in right foot: Secondary | ICD-10-CM | POA: Diagnosis not present

## 2016-04-04 DIAGNOSIS — R7303 Prediabetes: Secondary | ICD-10-CM | POA: Diagnosis not present

## 2016-04-04 DIAGNOSIS — M19071 Primary osteoarthritis, right ankle and foot: Secondary | ICD-10-CM | POA: Diagnosis not present

## 2016-04-04 DIAGNOSIS — E6609 Other obesity due to excess calories: Secondary | ICD-10-CM | POA: Diagnosis not present

## 2016-04-04 DIAGNOSIS — M7732 Calcaneal spur, left foot: Secondary | ICD-10-CM | POA: Diagnosis not present

## 2016-04-04 DIAGNOSIS — F419 Anxiety disorder, unspecified: Secondary | ICD-10-CM | POA: Diagnosis not present

## 2016-04-04 DIAGNOSIS — E669 Obesity, unspecified: Secondary | ICD-10-CM | POA: Insufficient documentation

## 2016-04-04 LAB — COMPLETE METABOLIC PANEL WITH GFR
ALT: 8 U/L (ref 6–29)
AST: 11 U/L (ref 10–30)
Albumin: 3.7 g/dL (ref 3.6–5.1)
Alkaline Phosphatase: 104 U/L (ref 33–115)
BUN: 8 mg/dL (ref 7–25)
CALCIUM: 8.8 mg/dL (ref 8.6–10.2)
CHLORIDE: 103 mmol/L (ref 98–110)
CO2: 27 mmol/L (ref 20–31)
CREATININE: 0.62 mg/dL (ref 0.50–1.10)
GFR, Est African American: >89 mL/min (ref >=60)
GFR, Est Non African American: >89 mL/min (ref >=60)
GLUCOSE: 96 mg/dL (ref 65–99)
Potassium: 4.1 mmol/L (ref 3.5–5.3)
SODIUM: 138 mmol/L (ref 135–146)
Total Bilirubin: 0.4 mg/dL (ref 0.2–1.2)
Total Protein: 7.3 g/dL (ref 6.1–8.1)

## 2016-04-04 LAB — LIPID PANEL
CHOL/HDL RATIO: 4.7 ratio (ref <=5.0)
Cholesterol: 168 mg/dL (ref 125–200)
HDL: 36 mg/dL — ABNORMAL LOW (ref >=46)
LDL CALC: 125 mg/dL (ref <130)
Triglycerides: 37 mg/dL (ref <150)
VLDL: 7 mg/dL (ref <30)

## 2016-04-04 LAB — HEMOGLOBIN A1C
HEMOGLOBIN A1C: 5.7 % — AB (ref <5.7)
Mean Plasma Glucose: 117 mg/dL

## 2016-04-04 LAB — TSH: TSH: 0.53 mIU/L

## 2016-04-04 MED ORDER — AMLODIPINE BESYLATE 5 MG PO TABS: 5.0000 mg | ORAL_TABLET | Freq: Every day | ORAL | 5 refills | Status: DC

## 2016-04-04 MED ORDER — TRIAMTERENE-HCTZ 37.5-25 MG PO TABS: 0.5000 | ORAL_TABLET | Freq: Every day | ORAL | 0 refills | Status: DC

## 2016-04-04 NOTE — Patient Instructions (Addendum)
Your goal blood pressure is less than 140 mmHg on top. Try to follow the DASH guidelines (DASH stands for Dietary Approaches to Stop Hypertension) Try to limit the sodium in your diet.  Ideally, consume less than 1.5 grams (less than 1,500mg ) per day. Do not add salt when cooking or at the table.  Check the sodium amount on labels when shopping, and choose items lower in sodium when given a choice. Avoid or limit foods that already contain a lot of sodium. Eat a diet rich in fruits and vegetables and whole grains.  We'll get labs and xrays today Return in 4 weeks for recheck of your weight and blood pressure  Check out the information at familydoctor.org entitled "Nutrition for Weight Loss: What You Need to Know about Fad Diets" Try to lose between 1-2 pounds per week by taking in fewer calories and burning off more calories You can succeed by limiting portions, limiting foods dense in calories and fat, becoming more active, and drinking 8 glasses of water a day (64 ounces) Don't skip meals, especially breakfast, as skipping meals may alter your metabolism Do not use over-the-counter weight loss pills or gimmicks that claim rapid weight loss A healthy BMI (or body mass index) is between 18.5 and 24.9 You can calculate your ideal BMI at the [employer] website [employer]fr

## 2016-04-04 NOTE — Assessment & Plan Note (Signed)
Cautioned about risk about benzo; will not prescribe any more; recommend CBT, list of providers; mindfulness, meditation

## 2016-04-04 NOTE — Progress Notes (Signed)
BP 118/78   Pulse 78   Temp 97.9 F (36.6 C) (Oral)   Resp 14   Wt 230 lb (104.3 kg)   LMP 04/02/2016   SpO2 97%   BMI 37.12 kg/m    Subjective:    Patient ID: Alisha Kim, female    DOB: July 03, 1970, 45 y.o.   MRN: [id number]  HPI: Alisha Kim is a 45 y.o. female  Chief Complaint  Patient presents with  . Follow-up  . Foot Pain    bilateral   Patient is new to me; last seen by Dr. Chauncey Cruel who has moved out of state  Patient says she needs alprazolam and lisinopril and has pain in her feet She says she forgot to get those renewed; two deaths in the family and just didn't get around to getting refills She says she is taking the lisinopril; having periods  Came fasting; has prediabetes; no one in the family has diabetes, but doesn't know many of her father's side of the family  She has been getting prescriptions for Xanax from previous doctor; she has anxiety and has anxiety attacks; she does not get them often; this year has been rough with family deaths; she has worked with a Social worker  She has been having pain in her feet; since last bought of bronchitis she was on prednisone and weight spiked up to 230 pounds; she is walking 2 to 5 miles every 2 days; stretching and trying to monitor her feet; holding at 230 pounds; she got her to 180 pounds by doing walking, cut out sodas and sugary drinks, drank unsweetened tea She has been having pain in her heels; hard for her to walk; swelling in her left 1st MTP, swollen and red and really sore to the touch; heel pain, going on for months  Shellfish allergy after being lisinopril-hctz; wonders if there is a link  Depression screen [facility] 2/9 04/04/2016 07/24/2015 11/17/2014  Decreased Interest 0 0 0  Down, Depressed, Hopeless 0 0 0  PHQ - 2 Score 0 0 0   Relevant past medical, surgical, family and social history reviewed Past Medical History:  Diagnosis Date  . Allergy   . Anemia    history of  . Anxiety   . Asthma   .  Diabetes mellitus without complication ([place])    was told she was pre-diabetic  . Hypertension    History reviewed. No pertinent surgical history.   Family History  Problem Relation Age of Onset  . Alcohol abuse Mother   . Arthritis Mother   . Asthma Mother   . Depression Mother   . Drug abuse Mother   . Hypertension Mother   . Mental illness Mother   . Alcohol abuse Father   . Drug abuse Father   . Hypertension Brother   . Hearing loss Maternal Aunt   . Hypertension Maternal Aunt   . Stroke Maternal Aunt   . Cancer Maternal Grandmother   . Heart disease Maternal Grandmother   . Hypertension Maternal Grandmother    Social History  Substance Use Topics  . Smoking status: Never Smoker  . Smokeless tobacco: Never Used  . Alcohol use 0.0 oz/week     Comment: seldom use   Interim medical history since last visit reviewed. Allergies and medications reviewed  Review of Systems Per HPI unless specifically indicated above     Objective:    BP 118/78   Pulse 78   Temp 97.9 F (36.6 C) (Oral)  Resp 14   Wt 230 lb (104.3 kg)   LMP 04/02/2016   SpO2 97%   BMI 37.12 kg/m   Wt Readings from Last 3 Encounters:  04/04/16 230 lb (104.3 kg)  10/21/15 230 lb (104.3 kg)  07/24/15 236 lb 11.2 oz (107.4 kg)    Physical Exam  Constitutional: She appears well-developed and well-nourished. No distress.  Weight stable over last 5+ months  HENT:  Head: Normocephalic and atraumatic.  Eyes: EOM are normal. No scleral icterus.  Neck: No thyromegaly present.  Cardiovascular: Normal rate, regular rhythm and normal heart sounds.   No murmur heard. Pulmonary/Chest: Effort normal and breath sounds normal. She has no wheezes.  Abdominal: Soft. Bowel sounds are normal. She exhibits no distension.  Musculoskeletal: Normal range of motion. She exhibits no edema.       Right foot: There is tenderness (heel).       Left foot: There is tenderness (heel).  No tenderness along plantar  fascia; no nodules along achilles tendon  Neurological: She is alert. She exhibits normal muscle tone.  Skin: Skin is warm and dry. She is not diaphoretic. No pallor.  Psychiatric: She has a normal mood and affect. Her behavior is normal. Judgment and thought content normal.   Results for orders placed or performed in visit on 04/04/16  TSH  Result Value Ref Range   TSH 0.53 mIU/L  Hemoglobin A1c  Result Value Ref Range   Hgb A1c MFr Bld 5.7 (H) <5.7 %   Mean Plasma Glucose 117 mg/dL  COMPLETE METABOLIC PANEL WITH GFR  Result Value Ref Range   Sodium 138 135 - 146 mmol/L   Potassium 4.1 3.5 - 5.3 mmol/L   Chloride 103 98 - 110 mmol/L   CO2 27 20 - 31 mmol/L   Glucose, Bld 96 65 - 99 mg/dL   BUN 8 7 - 25 mg/dL   Creat 0.62 0.50 - 1.10 mg/dL   Total Bilirubin 0.4 0.2 - 1.2 mg/dL   Alkaline Phosphatase 104 33 - 115 U/L   AST 11 10 - 30 U/L   ALT 8 6 - 29 U/L   Total Protein 7.3 6.1 - 8.1 g/dL   Albumin 3.7 3.6 - 5.1 g/dL   Calcium 8.8 8.6 - 10.2 mg/dL   GFR, Est African American >89 >=60 mL/min   GFR, Est Non African American >89 >=60 mL/min  Lipid panel  Result Value Ref Range   Cholesterol 168 125 - 200 mg/dL   Triglycerides 37 <150 mg/dL   HDL 36 (L) >=46 mg/dL   Total CHOL/HDL Ratio 4.7 <=5.0 Ratio   VLDL 7 <30 mg/dL   LDL Cholesterol 125 <130 mg/dL  Uric acid  Result Value Ref Range   Uric Acid, Serum 5.1 2.5 - 7.0 mg/dL      Assessment & Plan:   Problem List Items Addressed This Visit      Cardiovascular and Mediastinum   Hypertension, goal below 140/90 (Chronic)    Well-controlled; I am stopping the ACE-I because of child-bearing age; switch agents; return in 2 weeks for recheck BP and BMP; weight loss and DASH guidelines      Relevant Medications   amLODipine (NORVASC) 5 MG tablet     Other   Pre-diabetes (Chronic)    Check A1c and fasting glucose today      Relevant Orders   Hemoglobin A1c (Completed)   COMPLETE METABOLIC PANEL WITH GFR (Completed)    Lipid panel (Completed)   Obesity (Chronic)  Work on weight loss, see AVS      Relevant Orders   TSH (Completed)   Bilateral foot pain    xrays to r/o heel spurs, to podiatrist if needed; turmeric, tylenol, ice, weight loss      Relevant Orders   XR Foot 2 Views Left (Completed)   XR Foot 2 Views Right (Completed)   Uric acid (Completed)   Anxiety    Cautioned about risk about benzo; will not prescribe any more; recommend CBT, list of providers; mindfulness, meditation      Relevant Medications   hydrOXYzine (ATARAX/VISTARIL) 25 MG tablet      Follow up plan: Return in about 4 weeks (around 05/02/2016) for blood pressure and weight.  An after-visit summary was printed and given to the patient at [place].  Please see the patient instructions which may contain other information and recommendations beyond what is mentioned above in the assessment and plan.  Meds ordered this encounter  Medications  . hydrOXYzine (ATARAX/VISTARIL) 25 MG tablet    Sig: Take 25 mg by mouth 2 (two) times daily as needed.  Marland Kitchen DISCONTD: triamterene-hydrochlorothiazide (MAXZIDE-25) 37.5-25 MG tablet    Sig: Take 0.5 tablets by mouth daily.    Dispense:  15 tablet    Refill:  0  . amLODipine (NORVASC) 5 MG tablet    Sig: Take 1 tablet (5 mg total) by mouth daily.    Dispense:  30 tablet    Refill:  5    DISREGARD the triam/hctz; cancel that RX please and thank you    Orders Placed This Encounter  Procedures  . XR Foot 2 Views Left  . XR Foot 2 Views Right  . TSH  . Hemoglobin A1c  . COMPLETE METABOLIC PANEL WITH GFR  . Lipid panel  . Uric acid

## 2016-04-04 NOTE — Assessment & Plan Note (Signed)
xrays to r/o heel spurs, to podiatrist if needed; turmeric, tylenol, ice, weight loss

## 2016-04-04 NOTE — Assessment & Plan Note (Signed)
Check A1c and fasting glucose today

## 2016-04-04 NOTE — Assessment & Plan Note (Signed)
Work on weight loss, see AVS

## 2016-04-04 NOTE — Assessment & Plan Note (Signed)
Well-controlled; I am stopping the ACE-I because of child-bearing age; switch agents; return in 2 weeks for recheck BP and BMP; weight loss and DASH guidelines

## 2016-04-05 LAB — URIC ACID: URIC ACID, SERUM: 5.1 mg/dL (ref 2.5–7.0)

## 2016-04-07 ENCOUNTER — Other Ambulatory Visit: Payer: Self-pay | Admitting: Family Medicine

## 2016-04-07 ENCOUNTER — Encounter: Payer: Self-pay | Admitting: Family Medicine

## 2016-04-07 DIAGNOSIS — M7732 Calcaneal spur, left foot: Secondary | ICD-10-CM

## 2016-04-07 DIAGNOSIS — M79671 Pain in right foot: Secondary | ICD-10-CM

## 2016-04-07 DIAGNOSIS — M79672 Pain in left foot: Secondary | ICD-10-CM

## 2016-04-07 NOTE — Assessment & Plan Note (Signed)
Refer to podiatrist 

## 2016-04-07 NOTE — Progress Notes (Signed)
Referral to podiatrist entered

## 2016-04-21 ENCOUNTER — Ambulatory Visit (INDEPENDENT_AMBULATORY_CARE_PROVIDER_SITE_OTHER): Payer: 59 | Admitting: Podiatry

## 2016-04-21 ENCOUNTER — Ambulatory Visit (INDEPENDENT_AMBULATORY_CARE_PROVIDER_SITE_OTHER): Payer: 59

## 2016-04-21 VITALS — BP 112/73 | HR 79 | Temp 96.8°F | Resp 16 | Ht 66.0 in | Wt 230.0 lb

## 2016-04-21 DIAGNOSIS — M722 Plantar fascial fibromatosis: Secondary | ICD-10-CM

## 2016-04-21 DIAGNOSIS — M79673 Pain in unspecified foot: Secondary | ICD-10-CM | POA: Diagnosis not present

## 2016-04-21 DIAGNOSIS — R52 Pain, unspecified: Secondary | ICD-10-CM

## 2016-04-21 DIAGNOSIS — M7751 Other enthesopathy of right foot: Secondary | ICD-10-CM

## 2016-04-21 DIAGNOSIS — M778 Other enthesopathies, not elsewhere classified: Secondary | ICD-10-CM

## 2016-04-21 DIAGNOSIS — M779 Enthesopathy, unspecified: Principal | ICD-10-CM

## 2016-04-21 MED ORDER — METHYLPREDNISOLONE 4 MG PO TBPK: ORAL_TABLET | ORAL | 0 refills | Status: DC

## 2016-04-21 MED ORDER — MELOXICAM 15 MG PO TABS: 15.0000 mg | ORAL_TABLET | Freq: Every day | ORAL | 3 refills | Status: DC

## 2016-04-21 NOTE — Patient Instructions (Signed)

## 2016-04-21 NOTE — Progress Notes (Signed)
   Subjective:    Patient ID: Alisha Kim, female    DOB: [birth year], 45 y.o.   MRN: [id number]  HPI: She presents today with a chief complaint of pain to the first metatarsophalangeal joint times the past several months. Seems to be coming go but is painful at times. She is also having pain to the left heel.    Review of Systems  Musculoskeletal: Positive for myalgias.  Psychiatric/Behavioral: The patient is nervous/anxious.        Objective:   Physical Exam: Vital signs are stable alert and oriented 3. Pulses are palpable. Neurologic sensorium is intact. The anterior flexor intact. Muscle strength is normal bilateral. Orthopedic evaluation demonstrates hallux abductovalgus deformity bilateral multiple hammertoe deformities bilateral. She has pain on end range of motion of the first metatarsophalangeal joint of the right foot. She also has soft tissue increase in density of her fascial insertion site of the left hip. Radiographs reviewed today which were taken just recently from her primary care demonstrate plantar distally oriented calcaneal heel spur right and left with left demonstrate a soft tissue increase in density report for any insertion. She also has some early dislocation of the first metatarsophalangeal joint and probable joint changes.         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 Plantar fasciitis left foot.  Plan: I injected the left heel today with Kenalog and local anesthetic placed. Myofascial brace to be followed by a night splint. Start her on a Medrol Dosepak to be followed by meloxicam. He was given both oral home-going instructions for care and soaking of her foot. I also provided her with stretching exercises. The other foot was injected overlying the first metatarsophalangeal joint dexamethasone and local anesthetic after sterile Betadine skin prep. I will follow-up with her 1 month.

## 2016-05-07 ENCOUNTER — Ambulatory Visit: Payer: 59 | Admitting: Family Medicine

## 2016-05-09 ENCOUNTER — Other Ambulatory Visit: Payer: Self-pay | Admitting: Family Medicine

## 2016-05-09 DIAGNOSIS — I10 Essential (primary) hypertension: Secondary | ICD-10-CM

## 2016-05-09 NOTE — Telephone Encounter (Signed)
Last note reviewed She is supposed to be taking amlodipine 5 mg daily She was going to f/u with [place] earlier this week and canceled her appt Please make sure she is taking amlodine and ask her to schedule BP f/u appt with me soon please; thank you

## 2016-05-09 NOTE — Telephone Encounter (Signed)
Called patient was confused she did not know that you had stopped the Maxzide.  I told her it was discontinued at last visit and she should stop and start the amlodipine 5mg  daily and schedule an appt for bp follow-up.

## 2016-05-14 ENCOUNTER — Encounter: Payer: Self-pay | Admitting: Podiatry

## 2016-05-18 ENCOUNTER — Encounter: Payer: Self-pay | Admitting: Family Medicine

## 2016-05-19 DIAGNOSIS — R509 Fever, unspecified: Secondary | ICD-10-CM | POA: Diagnosis not present

## 2016-05-24 ENCOUNTER — Other Ambulatory Visit: Payer: Self-pay | Admitting: Family Medicine

## 2016-05-26 ENCOUNTER — Ambulatory Visit (INDEPENDENT_AMBULATORY_CARE_PROVIDER_SITE_OTHER): Payer: 59 | Admitting: Podiatry

## 2016-05-26 DIAGNOSIS — M779 Enthesopathy, unspecified: Secondary | ICD-10-CM

## 2016-05-26 DIAGNOSIS — M7751 Other enthesopathy of right foot: Secondary | ICD-10-CM | POA: Diagnosis not present

## 2016-05-26 DIAGNOSIS — M722 Plantar fascial fibromatosis: Secondary | ICD-10-CM

## 2016-05-26 DIAGNOSIS — M778 Other enthesopathies, not elsewhere classified: Secondary | ICD-10-CM

## 2016-05-26 NOTE — Progress Notes (Signed)
She presents today for her one-month follow-up of capsulitis to the right foot which is doing great. The left foot plantar fasciitis is still painful. She states that the left foot still swells and is exquisitely painful overlying the dorsolateral aspect of the foot.  Objective: Vital signs are stable alert and oriented 3. Pulses are palpable. No erythema edema cellulitis drainage or odor. She has pain on direct palpation of the fourth and fifth metatarsocuboid articulation and on palpation in the continued tubercle of the left heel.  Assessment: Pain in limb secondary to plantar fasciitis and lateral compensatory syndrome left foot right foot is resolved.  Plan: Reinjected the left heel today with Kenalog and local anesthetic encouraged all range of motion exercises and stretching exercises as well as conservative therapies to be utilized. Follow-up with me in 1 month. We may need to inject the dorsolateral aspect of that time.

## 2016-06-06 ENCOUNTER — Encounter: Payer: Self-pay | Admitting: Podiatry

## 2016-06-06 ENCOUNTER — Encounter: Payer: Self-pay | Admitting: Family Medicine

## 2016-06-06 ENCOUNTER — Ambulatory Visit (INDEPENDENT_AMBULATORY_CARE_PROVIDER_SITE_OTHER): Payer: 59 | Admitting: Family Medicine

## 2016-06-06 VITALS — BP 120/80 | HR 83 | Temp 98.7°F | Resp 14 | Wt 230.2 lb

## 2016-06-06 DIAGNOSIS — E6609 Other obesity due to excess calories: Secondary | ICD-10-CM

## 2016-06-06 DIAGNOSIS — I10 Essential (primary) hypertension: Secondary | ICD-10-CM

## 2016-06-06 DIAGNOSIS — M79672 Pain in left foot: Secondary | ICD-10-CM | POA: Diagnosis not present

## 2016-06-06 DIAGNOSIS — Z6836 Body mass index (BMI) 36.0-36.9, adult: Secondary | ICD-10-CM

## 2016-06-06 DIAGNOSIS — M79671 Pain in right foot: Secondary | ICD-10-CM

## 2016-06-06 MED ORDER — AMLODIPINE BESYLATE 5 MG PO TABS: 5.0000 mg | ORAL_TABLET | Freq: Every day | ORAL | 1 refills | Status: DC

## 2016-06-06 NOTE — Progress Notes (Signed)
BP 120/80 (BP Location: Left Arm, Patient Position: Sitting, Cuff Size: Normal)   Pulse 83   Temp 98.7 F (37.1 C) (Oral)   Resp 14   Wt 230 lb 4 oz (104.4 kg)   LMP 06/06/2016   SpO2 97%   BMI 37.16 kg/m    Subjective:    Patient ID: Alisha Kim, female    DOB: January 29, 1971, 45 y.o.   MRN: [id number]  HPI: Alisha Kim is a 45 y.o. female  Chief Complaint  Patient presents with  . Hypertension    Patient check bp due to new medication  . Medication Reaction    Poss reaction to Meloxicam; patient had reaction with the medicaine but when she was off no reaction; mood swings serve bloating and headach and shortness of breath, not with the BP new med   She thinks she had a reaction to the meloxicam; she had inflammation in her feet; the medicine is wreaking havoc on her and she won't have any more; headache, bloating, shortness of breath, using inhaler more than usual  Changed her blood pressure medicine; she figures the reaction is from the meloxicam; went two days without the meloxicam and felt fine; then started back on the meloxicam and then felt horrible for two more days and was in the bed horrible; stopped and not going take it any more  She has e-mailed her foot doctor about the meloxicam and is waiting to hear back  BP medicine was changed; gets scared and doesn't want to check  She would like to lose 30 pounds; she can drink unsweetened tea or black tea all day, but not a big water drinker  Depression screen [facility] 2/9 06/06/2016 04/04/2016 07/24/2015 11/17/2014  Decreased Interest 0 0 0 0  Down, Depressed, Hopeless 0 0 0 0  PHQ - 2 Score 0 0 0 0   Relevant past medical, surgical, family and social history reviewed Past Medical History:  Diagnosis Date  . Allergy   . Anemia    history of  . Anxiety   . Asthma   . Diabetes mellitus without complication ([place])    was told she was pre-diabetic  . Hypertension    History reviewed. No pertinent surgical history.     Family History  Problem Relation Age of Onset  . Alcohol abuse Mother   . Arthritis Mother   . Asthma Mother   . Depression Mother   . Drug abuse Mother   . Hypertension Mother   . Mental illness Mother   . Alcohol abuse Father   . Drug abuse Father   . Hypertension Brother   . Hearing loss Maternal Aunt   . Hypertension Maternal Aunt   . Stroke Maternal Aunt   . Cancer Maternal Grandmother   . Heart disease Maternal Grandmother   . Hypertension Maternal Grandmother    Social History  Substance Use Topics  . Smoking status: Never Smoker  . Smokeless tobacco: Never Used  . Alcohol use 0.0 oz/week     Comment: seldom use   Interim medical history since last visit reviewed. Allergies and medications reviewed  Review of Systems Per HPI unless specifically indicated above     Objective:    BP 120/80 (BP Location: Left Arm, Patient Position: Sitting, Cuff Size: Normal)   Pulse 83   Temp 98.7 F (37.1 C) (Oral)   Resp 14   Wt 230 lb 4 oz (104.4 kg)   LMP 06/06/2016   SpO2 97%  BMI 37.16 kg/m   Wt Readings from Last 3 Encounters:  06/06/16 230 lb 4 oz (104.4 kg)  04/21/16 230 lb (104.3 kg)  04/04/16 230 lb (104.3 kg)    Physical Exam  Constitutional: She appears well-developed and well-nourished.  Obese, weight stable  HENT:  Mouth/Throat: Mucous membranes are normal.  Eyes: EOM are normal. No scleral icterus.  Cardiovascular: Normal rate and regular rhythm.   Pulmonary/Chest: Effort normal and breath sounds normal.  Psychiatric: She has a normal mood and affect. Her behavior is normal.    Results for orders placed or performed in visit on 04/04/16  TSH  Result Value Ref Range   TSH 0.53 mIU/L  Hemoglobin A1c  Result Value Ref Range   Hgb A1c MFr Bld 5.7 (H) <5.7 %   Mean Plasma Glucose 117 mg/dL  COMPLETE METABOLIC PANEL WITH GFR  Result Value Ref Range   Sodium 138 135 - 146 mmol/L   Potassium 4.1 3.5 - 5.3 mmol/L   Chloride 103 98 - 110 mmol/L    CO2 27 20 - 31 mmol/L   Glucose, Bld 96 65 - 99 mg/dL   BUN 8 7 - 25 mg/dL   Creat 0.62 0.50 - 1.10 mg/dL   Total Bilirubin 0.4 0.2 - 1.2 mg/dL   Alkaline Phosphatase 104 33 - 115 U/L   AST 11 10 - 30 U/L   ALT 8 6 - 29 U/L   Total Protein 7.3 6.1 - 8.1 g/dL   Albumin 3.7 3.6 - 5.1 g/dL   Calcium 8.8 8.6 - 10.2 mg/dL   GFR, Est African American >89 >=60 mL/min   GFR, Est Non African American >89 >=60 mL/min  Lipid panel  Result Value Ref Range   Cholesterol 168 125 - 200 mg/dL   Triglycerides 37 <150 mg/dL   HDL 36 (L) >=46 mg/dL   Total CHOL/HDL Ratio 4.7 <=5.0 Ratio   VLDL 7 <30 mg/dL   LDL Cholesterol 125 <130 mg/dL  Uric acid  Result Value Ref Range   Uric Acid, Serum 5.1 2.5 - 7.0 mg/dL      Assessment & Plan:   Problem List Items Addressed This Visit      Cardiovascular and Mediastinum   Hypertension, goal below 140/90 - Primary (Chronic)    Try weight loss, DASH guidelines; use amlodipine; monitor at home or here or pharmacy; discussed target BP; if not controlled, call me; avoid decongestants      Relevant Medications   amLODipine (NORVASC) 5 MG tablet     Other   Obesity (Chronic)    Encouragement given for weight loss; healthy eating, adequate H2O intake      Bilateral foot pain    Under the care of podiatrist; patient reacted to the meloxicam; added to adverse reactions here; she will talk with him about next step for foot care and treatment          Follow up plan: No Follow-up on file.  An after-visit summary was printed and given to the patient at [place] [facility].  Please see the patient instructions which may contain other information and recommendations beyond what is mentioned above in the assessment and plan.  Meds ordered this encounter  Medications  . diphenhydrAMINE (BENADRYL) 25 mg capsule    Sig: Take by mouth.  Marland Kitchen amLODipine (NORVASC) 5 MG tablet    Sig: Take 1 tablet (5 mg total) by mouth daily.    Dispense:  90 tablet    Refill:  1  DISREGARD the triam/hctz; cancel that RX please and thank you    No orders of the defined types were placed in this encounter.

## 2016-06-06 NOTE — Patient Instructions (Addendum)
Check out the information at familydoctor.org entitled "Nutrition for Weight Loss: What You Need to Know about Fad Diets" Try to lose between 1-2 pounds per week by taking in fewer calories and burning off more calories You can succeed by limiting portions, limiting foods dense in calories and fat, becoming more active, and drinking 8 glasses of water a day (64 ounces) Don't skip meals, especially breakfast, as skipping meals may alter your metabolism Do not use over-the-counter weight loss pills or gimmicks that claim rapid weight loss A healthy BMI (or body mass index) is between 18.5 and 24.9 You can calculate your ideal BMI at the [place] website [employer]fr  Your goal blood pressure is less than 140 mmHg on top and under 90 on the bottom Try to follow the DASH guidelines (DASH stands for Dietary Approaches to Stop Hypertension) Try to limit the sodium in your diet.  Ideally, consume less than 1.5 grams (less than 1,500mg ) per day. Do not add salt when cooking or at the table.  Check the sodium amount on labels when shopping, and choose items lower in sodium when given a choice. Avoid or limit foods that already contain a lot of sodium. Eat a diet rich in fruits and vegetables and whole grains.  Just call with an update in 3 months

## 2016-06-15 NOTE — Assessment & Plan Note (Signed)
Encouragement given for weight loss; healthy eating, adequate H2O intake

## 2016-06-15 NOTE — Assessment & Plan Note (Signed)
Try weight loss, DASH guidelines; use amlodipine; monitor at home or here or pharmacy; discussed target BP; if not controlled, call me; avoid decongestants

## 2016-06-15 NOTE — Assessment & Plan Note (Signed)
Under the care of podiatrist; patient reacted to the meloxicam; added to adverse reactions here; she will talk with him about next step for foot care and treatment

## 2016-06-30 ENCOUNTER — Ambulatory Visit: Payer: 59 | Admitting: Podiatry

## 2016-07-07 ENCOUNTER — Other Ambulatory Visit: Payer: Self-pay | Admitting: Family Medicine

## 2016-07-07 ENCOUNTER — Encounter: Payer: Self-pay | Admitting: Emergency Medicine

## 2016-07-07 ENCOUNTER — Emergency Department
Admission: EM | Admit: 2016-07-07 | Discharge: 2016-07-07 | Disposition: A | Discharge status: Home / Self Care | Payer: 59 | Attending: Emergency Medicine | Admitting: Emergency Medicine

## 2016-07-07 DIAGNOSIS — J45909 Unspecified asthma, uncomplicated: Secondary | ICD-10-CM | POA: Diagnosis not present

## 2016-07-07 DIAGNOSIS — R1031 Right lower quadrant pain: Secondary | ICD-10-CM | POA: Insufficient documentation

## 2016-07-07 DIAGNOSIS — I10 Essential (primary) hypertension: Secondary | ICD-10-CM | POA: Insufficient documentation

## 2016-07-07 DIAGNOSIS — E119 Type 2 diabetes mellitus without complications: Secondary | ICD-10-CM | POA: Insufficient documentation

## 2016-07-07 DIAGNOSIS — Z79899 Other long term (current) drug therapy: Secondary | ICD-10-CM | POA: Diagnosis not present

## 2016-07-07 DIAGNOSIS — Z5321 Procedure and treatment not carried out due to patient leaving prior to being seen by health care provider: Secondary | ICD-10-CM | POA: Diagnosis not present

## 2016-07-07 LAB — COMPREHENSIVE METABOLIC PANEL
ALBUMIN: 3.9 g/dL (ref 3.5–5.0)
ALT: 13 U/L — AB (ref 14–54)
AST: 15 U/L (ref 15–41)
Alkaline Phosphatase: 109 U/L (ref 38–126)
Anion gap: 3 — ABNORMAL LOW (ref 5–15)
BILIRUBIN TOTAL: 0.6 mg/dL (ref 0.3–1.2)
BUN: 10 mg/dL (ref 6–20)
CHLORIDE: 104 mmol/L (ref 101–111)
CO2: 29 mmol/L (ref 22–32)
CREATININE: 0.75 mg/dL (ref 0.44–1.00)
Calcium: 8.8 mg/dL — ABNORMAL LOW (ref 8.9–10.3)
GFR calc Af Amer: >60 mL/min (ref >60)
GFR calc non Af Amer: >60 mL/min (ref >60)
Glucose, Bld: 97 mg/dL (ref 65–99)
POTASSIUM: 3.3 mmol/L — AB (ref 3.5–5.1)
Sodium: 136 mmol/L (ref 135–145)
Total Protein: 8.2 g/dL — ABNORMAL HIGH (ref 6.5–8.1)

## 2016-07-07 LAB — URINALYSIS, COMPLETE (UACMP) WITH MICROSCOPIC
BILIRUBIN URINE: NEGATIVE
Glucose, UA: NEGATIVE mg/dL
Ketones, ur: NEGATIVE mg/dL
Nitrite: NEGATIVE
PH: 6 (ref 5.0–8.0)
Protein, ur: NEGATIVE mg/dL
SPECIFIC GRAVITY, URINE: 1.005 (ref 1.005–1.030)

## 2016-07-07 LAB — CBC
HEMATOCRIT: 37.5 % (ref 35.0–47.0)
Hemoglobin: 12.7 g/dL (ref 12.0–16.0)
MCH: 26.6 pg (ref 26.0–34.0)
MCHC: 33.7 g/dL (ref 32.0–36.0)
MCV: 78.7 fL — ABNORMAL LOW (ref 80.0–100.0)
PLATELETS: 248 10*3/uL (ref 150–440)
RBC: 4.76 MIL/uL (ref 3.80–5.20)
RDW: 15.4 % — AB (ref 11.5–14.5)
WBC: 6 10*3/uL (ref 3.6–11.0)

## 2016-07-07 LAB — POCT PREGNANCY, URINE: PREG TEST UR: NEGATIVE

## 2016-07-07 LAB — LIPASE, BLOOD: LIPASE: 22 U/L (ref 11–51)

## 2016-07-07 MED ORDER — AMLODIPINE BESYLATE 5 MG PO TABS: 5.0000 mg | ORAL_TABLET | Freq: Every day | ORAL | 1 refills | Status: DC

## 2016-07-07 NOTE — ED Notes (Signed)
Pt called in lobby, no answer.

## 2016-07-07 NOTE — ED Notes (Signed)
Pt requesting results to lab information; pt informed she would have to wait to be seen by the MD before she can get results.

## 2016-07-07 NOTE — ED Triage Notes (Signed)
Pt c/o right flank pain and RLQ pain since the end of December. Reports stopped her meloxicam then but still have pain. Denies NVD. Has had some constipation. Last BM today. Denies fevers.

## 2016-07-07 NOTE — Progress Notes (Signed)
Rx printed and given to pt 

## 2016-07-08 ENCOUNTER — Ambulatory Visit: Payer: 59 | Admitting: Family Medicine

## 2016-07-14 ENCOUNTER — Encounter: Payer: Self-pay | Admitting: Family Medicine

## 2016-07-21 ENCOUNTER — Ambulatory Visit: Payer: 59 | Admitting: Podiatry

## 2016-08-01 ENCOUNTER — Encounter: Payer: Self-pay | Admitting: Family Medicine

## 2016-08-01 ENCOUNTER — Ambulatory Visit (INDEPENDENT_AMBULATORY_CARE_PROVIDER_SITE_OTHER): Payer: 59 | Admitting: Family Medicine

## 2016-08-01 VITALS — BP 122/84 | HR 93 | Temp 97.9°F | Resp 14 | Wt 222.9 lb

## 2016-08-01 DIAGNOSIS — R718 Other abnormality of red blood cells: Secondary | ICD-10-CM

## 2016-08-01 DIAGNOSIS — H5213 Myopia, bilateral: Secondary | ICD-10-CM | POA: Diagnosis not present

## 2016-08-01 DIAGNOSIS — R102 Pelvic and perineal pain unspecified side: Secondary | ICD-10-CM

## 2016-08-01 DIAGNOSIS — N926 Irregular menstruation, unspecified: Secondary | ICD-10-CM

## 2016-08-01 DIAGNOSIS — Z8619 Personal history of other infectious and parasitic diseases: Secondary | ICD-10-CM

## 2016-08-01 DIAGNOSIS — E876 Hypokalemia: Secondary | ICD-10-CM | POA: Diagnosis not present

## 2016-08-01 DIAGNOSIS — N921 Excessive and frequent menstruation with irregular cycle: Secondary | ICD-10-CM | POA: Diagnosis not present

## 2016-08-01 LAB — COMPLETE METABOLIC PANEL WITH GFR
ALK PHOS: 109 U/L (ref 33–115)
ALT: 10 U/L (ref 6–29)
AST: 11 U/L (ref 10–35)
Albumin: 3.8 g/dL (ref 3.6–5.1)
BUN: 8 mg/dL (ref 7–25)
CHLORIDE: 104 mmol/L (ref 98–110)
CO2: 28 mmol/L (ref 20–31)
CREATININE: 0.7 mg/dL (ref 0.50–1.10)
Calcium: 9 mg/dL (ref 8.6–10.2)
GFR, Est Non African American: >89 mL/min (ref >=60)
Glucose, Bld: 85 mg/dL (ref 65–99)
POTASSIUM: 4.1 mmol/L (ref 3.5–5.3)
Sodium: 137 mmol/L (ref 135–146)
Total Bilirubin: 0.4 mg/dL (ref 0.2–1.2)
Total Protein: 7.4 g/dL (ref 6.1–8.1)

## 2016-08-01 LAB — TSH: TSH: 0.98 m[IU]/L

## 2016-08-01 NOTE — Assessment & Plan Note (Signed)
Check ferritin level; start iron

## 2016-08-01 NOTE — Progress Notes (Signed)
BP 122/84   Pulse 93   Temp 97.9 F (36.6 C) (Oral)   Resp 14   Wt 222 lb 14.4 oz (101.1 kg)   LMP 07/21/2016   SpO2 98%   BMI 37.09 kg/m    Subjective:    Patient ID: Alisha Kim, female    DOB: Jul 06, 1970, 46 y.o.   MRN: [id number]  HPI: Alisha Kim is a 46 y.o. female  Chief Complaint  Patient presents with  . Menorrhagia    with hot flashes   Heavy menstrual period This last one was 7 days and heavy periods Worse menstrual cramping; no nausea or sweating Side and back hurt really bad Right side was really hurting; still has pain there, hurting so bad going down the leg; limps a little bit Jan was only 3 days and not the heavy clots RLQ pain; today just "uncomfortable", sometimes keeps her up at night Mother early menopause Hot flashes are an 8 out of 10 She is drinking more water, weight loss has been intentional; no known thyroid disease in the family Not much known about her paternal side  She was in the ER on January 29th; we reviewed her labs together Pregnancy test negative Urine reviewed; 0-2 RBC; no burning with urination MCV low at 78.7, H/H 12.7/37.5 Hx of iron deficiency Lipase normal K+ was low at 3.3; calcium was low, protein low, ALT low  Depression screen [facility] 2/9 08/01/2016 06/06/2016 04/04/2016 07/24/2015 11/17/2014  Decreased Interest 0 0 0 0 0  Down, Depressed, Hopeless 0 0 0 0 0  PHQ - 2 Score 0 0 0 0 0   Relevant past medical, surgical, family and social history reviewed Past Medical History:  Diagnosis Date  . Allergy   . Anemia    history of  . Anxiety   . Asthma   . Diabetes mellitus without complication ([place])    was told she was pre-diabetic  . History of Helicobacter pylori infection 08/08/2016   2004  . Hypertension    History reviewed. No pertinent surgical history.   Family History  Problem Relation Age of Onset  . Alcohol abuse Mother   . Arthritis Mother   . Asthma Mother   . Depression Mother   . Drug abuse  Mother   . Hypertension Mother   . Mental illness Mother   . Cancer Mother     breast and lung  . Alcohol abuse Father   . Drug abuse Father   . Cancer Father     lung  . Hypertension Brother   . Hearing loss Maternal Aunt   . Hypertension Maternal Aunt   . Stroke Maternal Aunt   . Alzheimer's disease Maternal Aunt   . Cancer Maternal Grandmother     cervical  . Heart disease Maternal Grandmother   . Hypertension Maternal Grandmother    Social History  Substance Use Topics  . Smoking status: Never Smoker  . Smokeless tobacco: Never Used  . Alcohol use 0.0 oz/week     Comment: seldom use    Interim medical history since last visit reviewed. Allergies and medications reviewed  Review of Systems Per HPI unless specifically indicated above     Objective:    BP 122/84   Pulse 93   Temp 97.9 F (36.6 C) (Oral)   Resp 14   Wt 222 lb 14.4 oz (101.1 kg)   LMP 07/21/2016   SpO2 98%   BMI 37.09 kg/m   Wt Readings from  Last 3 Encounters:  08/01/16 222 lb 14.4 oz (101.1 kg)  07/07/16 222 lb (100.7 kg)  06/06/16 230 lb 4 oz (104.4 kg)    Physical Exam  Constitutional: She appears well-developed and well-nourished. No distress.  Eyes: EOM are normal. No scleral icterus.  Neck: No thyromegaly present.  Cardiovascular: Normal rate and regular rhythm.   Pulmonary/Chest: Effort normal and breath sounds normal.  Abdominal: She exhibits no distension.  Skin: No pallor.  Psychiatric: She has a normal mood and affect. Her behavior is normal. Judgment and thought content normal.      Assessment & Plan:   Problem List Items Addressed This Visit      Other   Pelvic pain in female   Relevant Orders   US Transvaginal Non-OB   Luteinizing hormone (Completed)   Follicle stimulating hormone (Completed)   US Pelvis Complete   Microcytosis    Check ferritin level; start iron      Relevant Orders   Ferritin (Completed)    Other Visit Diagnoses    Irregular periods    -   Primary   Relevant Orders   Luteinizing hormone (Completed)   Follicle stimulating hormone (Completed)   TSH (Completed)   US Pelvis Complete   Menometrorrhagia       Relevant Orders   Luteinizing hormone (Completed)   Follicle stimulating hormone (Completed)   US Pelvis Complete   Hypokalemia       Relevant Orders   COMPLETE METABOLIC PANEL WITH GFR (Completed)   Low calcium levels       Relevant Orders   COMPLETE METABOLIC PANEL WITH GFR (Completed)       Follow up plan: No Follow-up on file.  An after-visit summary was printed and given to the patient at [place].  Please see the patient instructions which may contain other information and recommendations beyond what is mentioned above in the assessment and plan.  Meds ordered this encounter  Medications  . Probiotic Product (PROBIOTIC-10 PO)    Sig: Take 1 tablet by mouth daily.  . Multiple Vitamin (MULTIVITAMIN) tablet    Sig: Take 1 tablet by mouth daily.    Orders Placed This Encounter  Procedures  . US Transvaginal Non-OB  . US Pelvis Complete  . Ferritin  . COMPLETE METABOLIC PANEL WITH GFR  . Luteinizing hormone  . Follicle stimulating hormone  . TSH

## 2016-08-01 NOTE — Patient Instructions (Addendum)
Start back on ferrous sulfate 324 or 325 mg or 65 mg of "elemental iron", once a day We'll get labs and the [place] If your pain gets worse, then go to the ER

## 2016-08-02 LAB — LUTEINIZING HORMONE: LH: 38.6 m[IU]/mL

## 2016-08-02 LAB — FERRITIN: Ferritin: 37 ng/mL (ref 10–232)

## 2016-08-02 LAB — FOLLICLE STIMULATING HORMONE: FSH: 15.6 m[IU]/mL

## 2016-08-04 MED FILL — AMLODIPINE BESYLATE 5 MG TA: 5 | 90 days supply | Qty: 90 | Fill #0 | Status: TO

## 2016-08-05 ENCOUNTER — Telehealth: Payer: Self-pay

## 2016-08-05 NOTE — Telephone Encounter (Signed)
I called this patient to inform her that she has been scheduled to have her ultrasounds on 08/13/16 @ 4:30pm at [facility], but there was no answer. A message was left with that information and the number to centralized scheduling [phone]) was given in case she need to reschedule.

## 2016-08-08 ENCOUNTER — Encounter: Payer: Self-pay | Admitting: Family Medicine

## 2016-08-08 DIAGNOSIS — Z8619 Personal history of other infectious and parasitic diseases: Secondary | ICD-10-CM

## 2016-08-08 HISTORY — DX: Personal history of other infectious and parasitic diseases: Z86.19

## 2016-08-08 MED ORDER — OMEPRAZOLE 40 MG PO CPDR: 40.0000 mg | DELAYED_RELEASE_CAPSULE | Freq: Every day | ORAL | 0 refills | Status: DC

## 2016-08-08 NOTE — Telephone Encounter (Signed)
I called patient; apologized that it's been a week since she sent the message She used to take meloxicam for pains in her foot; messed up her stomach for a while Started probiotics and that got better Had pains in her stomach, bloated, couldn't hardly eat Still has pains on her right side No blood in the stool Having regular BMs She had H pylori 14 years ago We'll retest; start PPI

## 2016-08-08 NOTE — Assessment & Plan Note (Signed)
Check for H pylori; start PPI

## 2016-08-13 ENCOUNTER — Ambulatory Visit
Admission: RE | Admit: 2016-08-13 | Discharge: 2016-08-13 | Disposition: A | Discharge status: Home / Self Care | Payer: 59 | Source: Ambulatory Visit | Attending: Family Medicine | Admitting: Family Medicine

## 2016-08-13 DIAGNOSIS — N926 Irregular menstruation, unspecified: Secondary | ICD-10-CM | POA: Diagnosis present

## 2016-08-13 DIAGNOSIS — R935 Abnormal findings on diagnostic imaging of other abdominal regions, including retroperitoneum: Secondary | ICD-10-CM | POA: Diagnosis not present

## 2016-08-13 DIAGNOSIS — R102 Pelvic and perineal pain: Secondary | ICD-10-CM

## 2016-08-13 DIAGNOSIS — N921 Excessive and frequent menstruation with irregular cycle: Secondary | ICD-10-CM

## 2016-08-13 DIAGNOSIS — D259 Leiomyoma of uterus, unspecified: Secondary | ICD-10-CM | POA: Insufficient documentation

## 2016-08-14 ENCOUNTER — Encounter: Payer: Self-pay | Admitting: Family Medicine

## 2016-08-14 ENCOUNTER — Telehealth: Payer: Self-pay | Admitting: Family Medicine

## 2016-08-14 DIAGNOSIS — N7011 Chronic salpingitis: Secondary | ICD-10-CM

## 2016-08-14 DIAGNOSIS — D259 Leiomyoma of uterus, unspecified: Secondary | ICD-10-CM

## 2016-08-14 DIAGNOSIS — D219 Benign neoplasm of connective and other soft tissue, unspecified: Secondary | ICD-10-CM

## 2016-08-14 DIAGNOSIS — R102 Pelvic and perineal pain: Secondary | ICD-10-CM

## 2016-08-14 HISTORY — DX: Chronic salpingitis: N70.11

## 2016-08-14 HISTORY — DX: Benign neoplasm of connective and other soft tissue, unspecified: D21.9

## 2016-08-14 NOTE — Assessment & Plan Note (Signed)
refer

## 2016-08-14 NOTE — Telephone Encounter (Signed)
Discussed with p atient, refer to gyn

## 2016-08-14 NOTE — Assessment & Plan Note (Signed)
Refer to gyn 

## 2016-08-22 ENCOUNTER — Encounter: Payer: Self-pay | Admitting: Family Medicine

## 2016-09-04 ENCOUNTER — Ambulatory Visit (INDEPENDENT_AMBULATORY_CARE_PROVIDER_SITE_OTHER): Payer: 59 | Admitting: Obstetrics and Gynecology

## 2016-09-04 ENCOUNTER — Other Ambulatory Visit: Payer: Self-pay | Admitting: Family Medicine

## 2016-09-04 ENCOUNTER — Encounter: Payer: Self-pay | Admitting: Family Medicine

## 2016-09-04 ENCOUNTER — Encounter: Payer: Self-pay | Admitting: Obstetrics and Gynecology

## 2016-09-04 ENCOUNTER — Other Ambulatory Visit: Payer: Self-pay | Admitting: Obstetrics and Gynecology

## 2016-09-04 VITALS — BP 120/75 | HR 80 | Ht 66.0 in | Wt 221.4 lb

## 2016-09-04 DIAGNOSIS — R102 Pelvic and perineal pain: Secondary | ICD-10-CM | POA: Diagnosis not present

## 2016-09-04 DIAGNOSIS — N840 Polyp of corpus uteri: Secondary | ICD-10-CM | POA: Diagnosis not present

## 2016-09-04 DIAGNOSIS — D259 Leiomyoma of uterus, unspecified: Secondary | ICD-10-CM

## 2016-09-04 DIAGNOSIS — N7011 Chronic salpingitis: Secondary | ICD-10-CM | POA: Diagnosis not present

## 2016-09-04 DIAGNOSIS — N939 Abnormal uterine and vaginal bleeding, unspecified: Secondary | ICD-10-CM | POA: Diagnosis not present

## 2016-09-04 MED ORDER — OMEPRAZOLE 20 MG PO CPDR: 20.0000 mg | DELAYED_RELEASE_CAPSULE | Freq: Every day | ORAL | 1 refills | Status: DC

## 2016-09-04 MED ORDER — DOXYCYCLINE HYCLATE 100 MG PO CAPS: 100.0000 mg | ORAL_CAPSULE | Freq: Two times a day (BID) | ORAL | 0 refills | Status: DC

## 2016-09-04 NOTE — Progress Notes (Signed)
GYNECOLOGY CLINIC PROGRESS NOTE  Subjective:    Alisha Kim is a 46 y.o. G42P1001 female who presents as a referral from Dr. Enid Derry with uterine fibroids. Periods are irregular, lasting 7 days.  Periods are occurring twice per month. This has been ongoing since December 2017. Dysmenorrhea: moderate, occurring throughout menses. Takes Aleve (which helps).  No intermenstrual bleeding, spotting, or discharge.  Patient also c/o right sided dull, achy, intermittent, non-radiating pain over the past 2-3 months as well.  The pain is what prompted a workup including a pelvic ultrasound, and led to the diagnosis of her fibroids.   Current contraception: none History of abnormal Pap smear: no.  Last pap smear 2017. Family history of uterine or ovarian cancer: no Regular self breast exam: yes History of abnormal mammogram: no.  Last mammogram 2017.  Family history of breast cancer: yes - mom (diagnosed age 77), also with lung cancer (diagnosed age 54)  Menstrual History: OB History    Gravida Para Term Preterm AB Living   1 1 1     1    SAB TAB Ectopic Multiple Live Births           1      Menarche age: 65 Patient's last menstrual period was 09/03/2016. Period Cycle (Days): 14 Period Duration (Days): 5-7 Period Pattern: Regular Menstrual Flow: Heavy Dysmenorrhea: (!) Severe Dysmenorrhea Symptoms: Cramping     Past Medical History:  Diagnosis Date  . Allergy   . Anemia    history of  . Anxiety   . Asthma   . Diabetes mellitus without complication ([place])    was told she was pre-diabetic  . Fibroids 08/14/2016   [place] March 2018  . History of Helicobacter pylori infection 08/08/2016   2004  . 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 Hypertension     Family History  Problem Relation Age of Onset  . Alcohol abuse Mother   . Arthritis Mother   . Asthma Mother   . Depression Mother   . Drug abuse Mother   . Hypertension Mother   . Mental illness Mother   . Cancer Mother    breast and lung  . Alcohol abuse Father   . Drug abuse Father   . Cancer Father     lung  . Hypertension Brother   . Hearing loss Maternal Aunt   . Hypertension Maternal Aunt   . Stroke Maternal Aunt   . Alzheimer's disease Maternal Aunt   . Cancer Maternal Grandmother     cervical  . Heart disease Maternal Grandmother   . Hypertension Maternal Grandmother     History reviewed. No pertinent surgical history.  Social History   Social History  . Marital status: Married    Spouse name: N/A  . Number of children: N/A  . Years of education: N/A   Occupational History  . Not on file.   Social History Main Topics  . Smoking status: Never Smoker  . Smokeless tobacco: Never Used  . Alcohol use 0.0 oz/week     Comment: seldom use  . Drug use: No  . Sexual activity: Yes    Partners: Male    Birth control/ protection: None, Condom   Other Topics Concern  . Not on file   Social History Narrative  . No narrative on file    Allergies  Allergen Reactions  . Meloxicam Other (See Comments)    Felt horrible, headache, bloating, back pain, SHOB; no rash  .  Zithromax [Azithromycin] Swelling    Tongue   . Pseudoephedrine-Guaifenesin Swelling    Of tongue  . Shellfish Allergy Rash    Review of Systems Constitutional: negative for chills, fatigue, fevers and sweats Eyes: negative for irritation, redness and visual disturbance Ears, nose, mouth, throat, and face: negative for hearing loss, nasal congestion, snoring and tinnitus Respiratory: negative for asthma, cough, sputum Cardiovascular: negative for chest pain, dyspnea, exertional chest pressure/discomfort, irregular heart beat, palpitations and syncope Gastrointestinal: negative for abdominal pain, change in bowel habits, nausea and vomiting Genitourinary: positive for abnormal menstrual periods. Negative for genital lesions, sexual problems and vaginal discharge, dysuria and urinary incontinence Integument/breast:  negative for breast lump, breast tenderness and nipple discharge Hematologic/lymphatic: negative for bleeding and easy bruising Musculoskeletal:negative for back pain and muscle weakness Neurological: negative for dizziness, headaches, vertigo and weakness Endocrine: negative for diabetic symptoms including polydipsia, polyuria and skin dryness Allergic/Immunologic: negative for hay fever and urticaria      Objective:     BP 120/75 (BP Location: Left Arm, Patient Position: Sitting, Cuff Size: Large)   Pulse 80   Ht 5\' 6"  (1.676 m)   Wt 221 lb 6.4 oz (100.4 kg)   LMP 09/03/2016   BMI 35.73 kg/m   General appearance: alert and no distress Neck: no adenopathy, no carotid bruit, no JVD, supple, symmetrical, trachea midline and thyroid not enlarged, symmetric, no tenderness/mass/nodules Lungs: clear to auscultation bilaterally Heart: regular rate and rhythm, S1, S2 normal, no murmur, click, rub or gallop Abdomen: normal findings: bowel sounds normal, no masses palpable and soft and abnormal findings:  mild tenderness in the RLQ Pelvic: external genitalia normal, rectovaginal septum normal.  Vagina with small amount of thin white discharge, no odor.  Cervix normal appearing, no lesions and no motion tenderness. Nabothian cysts present.  Uterus mobile, nontender, normal shape and size.  Adnexae non-palpable, nontender bilaterally.  Extremities: extremities normal, atraumatic, no cyanosis or edema   Neurological ROS: negative for - dizziness, headaches or weakness   Labs:  Pap smear negative 07/2015.   Lab Results  Component Value Date   WBC 6.0 07/07/2016   HGB 12.7 07/07/2016   HCT 37.5 07/07/2016   MCV 78.7 (L) 07/07/2016   PLT 248 07/07/2016   Lab Results  Component Value Date   TSH 0.98 08/01/2016     Ultrasound (08/13/2016):  CLINICAL DATA:  Right-sided pelvic pain with abnormal uterine bleeding for 2-3 months.  EXAM: TRANSABDOMINAL AND TRANSVAGINAL ULTRASOUND OF  PELVIS  TECHNIQUE: Both transabdominal and transvaginal ultrasound examinations of the pelvis were performed. Transabdominal technique was performed for global imaging of the pelvis including uterus, ovaries, adnexal regions, and pelvic cul-de-sac. It was necessary to proceed with endovaginal exam following the transabdominal exam to visualize the 03/17/2006.  COMPARISON:  03/17/2006  FINDINGS: Uterus  Measurements: 9.3 x 5.1 x 6.4 cm. Multiple fibroids are identified, measuring up to 2.6 cm.  Endometrium  Thickness: 4 mm.  No focal abnormality visualized.  Right ovary  Measurements: 5.1 x 2.2 x 3.2 cm. Multiple follicles evident.  Left ovary  Measurements: 4.0 x 2.7 x 3.3 cm. Multiple follicles.  Other findings  In the right adnexal space, sonographer identifies an elongated cystic area measuring 4.3 x 2.9 x 2.1 cm. This is in the region of the uterine fundus tracking towards the right ovary and while sonographic appearance is not classic for a dilated fallopian tube, short segment hydrosalpinx is a consideration.  Moderate intraperitoneal free fluid is identified in the cul-de-sac  and right adnexal space.  IMPRESSION: 1. Multiple uterine fibroids. 2. No endometrial abnormality. 3. Elongated cystic structure right adnexal space, question short segment hydrosalpinx on the right. Follow-up ultrasound in 6-12 weeks could be used to ensure stability. 4. Moderate intraperitoneal free fluid.   Assessment:   Symptomatic uterine fibroids.   Abnormal uterine bleeding Pelvic pain Right side hydrosalpinx  Plan:   - Reviewed abnormal uterine bleeding etiologies, including her likely perimenopausal state, as well as uterine fibroids. I also discussed further evaluation needed, with an endometrial biopsy.  Offered for patient to have it performed today, or return for a separate visit.  Desires to have it performed today. See procedure note below.   - Management  options for abnormal uterine bleeding with fibroids werebriefly reviewed, including tranexamic acid (Lysteda), oral progesterone, Depo Provera, Mirena IUD (depending on location of the fibroids), endometrial ablation (Novasure, depending on location of the fibroids) or hysterectomy as definitive surgical management.   Patient considering hysterectomy currently, however has not tried any other previously mentioned methods.  Printed patient education handouts were given to the patient to review at home.  Hgb stable currently, bleeding precautions reviewed.  - Pelvic pain usually controlled with Aleve. Can continue use of medication.  - Right hydrosalpinx present.  Will attempt to treat with antibiotics (Doxycyline x 1 week).  If no resolution, and still symptomatic, can consider surgical removal if surgical intervention is desired for abnormal bleeding and fibroids.   RTC in 2 weeks for discussion of results and further discussion of management options.     Endometrial Biopsy Procedure Note  The patient is positioned on the exam table in the dorsal lithotomy position. Bimanual exam confirms uterine position and size. A Graves speculum is placed into the vagina. A single toothed tenaculum is placed onto the anterior lip of the cervix. The pipette is placed into the endocervical canal and is advanced to the uterine fundus. Using a piston like technique, with vacuum created by withdrawing the stylus, the endometrial specimen is obtained and transferred to the biopsy container. Minimal bleeding is encountered. The procedure is well tolerated.   Uterine Position: anterior    Uterine Length:  9 cm   Uterine Specimen: Average   Post procedure instructions are given. The patient is scheduled for follow up appointment.   [name], MD [facility] 09/04/2016 11:28 AM

## 2016-09-04 NOTE — Telephone Encounter (Signed)
Please ask patient if she is willing to step down now from the 40 mg to the 20 mg omeprazole; we don't like to keep people on this long term If she'll accept 20 mg, I've sent that in Avoid trigger foods/drinks, don't eat for 3 hours before bed (I sent the 40 mg strength, then canceled it)

## 2016-09-04 NOTE — Telephone Encounter (Signed)
Called patient she states isn't even taking anymore the pharmacy must of sent automatically?

## 2016-09-08 LAB — PATHOLOGY

## 2016-09-10 ENCOUNTER — Encounter: Payer: Self-pay | Admitting: Obstetrics and Gynecology

## 2016-09-12 ENCOUNTER — Ambulatory Visit (INDEPENDENT_AMBULATORY_CARE_PROVIDER_SITE_OTHER): Payer: 59 | Admitting: Obstetrics and Gynecology

## 2016-09-12 ENCOUNTER — Encounter: Payer: Self-pay | Admitting: Obstetrics and Gynecology

## 2016-09-12 VITALS — BP 120/75 | HR 84 | Ht 66.0 in | Wt 220.1 lb

## 2016-09-12 DIAGNOSIS — R102 Pelvic and perineal pain: Secondary | ICD-10-CM

## 2016-09-12 DIAGNOSIS — D259 Leiomyoma of uterus, unspecified: Secondary | ICD-10-CM

## 2016-09-12 DIAGNOSIS — N939 Abnormal uterine and vaginal bleeding, unspecified: Secondary | ICD-10-CM

## 2016-09-12 DIAGNOSIS — N946 Dysmenorrhea, unspecified: Secondary | ICD-10-CM

## 2016-09-12 DIAGNOSIS — N840 Polyp of corpus uteri: Secondary | ICD-10-CM

## 2016-09-12 DIAGNOSIS — N7011 Chronic salpingitis: Secondary | ICD-10-CM

## 2016-09-12 NOTE — Patient Instructions (Signed)
You are scheduled for surgery on 09/29/2016.  Nothing to eat after midnight on day prior to surgery.  Do not take any medications unless recommended by your provider on day prior to surgery.  Do not take NSAIDs (Motrin, Aleve) or aspirin 7 days prior to surgery.  You may take Tylenol products for minor aches and pains.  You will receive a prescription for pain medications post-operatively.  You will be contacted by phone approximately 1 week prior to surgery to schedule pre-operative appointment.  Please call the office if you have any questions regarding your upcoming surgery.        Hysterectomy Information A hysterectomy is a surgery in which your uterus is removed. This surgery may be done to treat various medical problems. After the surgery, you will no longer have menstrual periods. The surgery will also make you unable to become pregnant (sterile). The fallopian tubes and ovaries can be removed (bilateral salpingo-oophorectomy) during this surgery as well. Reasons for a hysterectomy  Persistent, abnormal bleeding.  Lasting (chronic) pelvic pain or infection.  The lining of the uterus (endometrium) starts growing outside the uterus (endometriosis).  The endometrium starts growing in the muscle of the uterus (adenomyosis).  The uterus falls down into the vagina (pelvic organ prolapse).  Noncancerous growths in the uterus (uterine fibroids) that cause symptoms.  Precancerous cells.  Cervical cancer or uterine cancer. Types of hysterectomies  Supracervical hysterectomy-In this type, the top part of the uterus is removed, but not the cervix.  Total hysterectomy-The uterus and cervix are removed.  Radical hysterectomy-The uterus, the cervix, and the fibrous tissue that holds the uterus in place in the pelvis (parametrium) are removed. Ways a hysterectomy can be performed  Abdominal hysterectomy-A large surgical cut (incision) is made in the abdomen. The uterus is removed  through this incision.  Vaginal hysterectomy-An incision is made in the vagina. The uterus is removed through this incision. There are no abdominal incisions.  Conventional laparoscopic hysterectomy-Three or four small incisions are made in the abdomen. A thin, lighted tube with a camera (laparoscope) is inserted into one of the incisions. Other tools are put through the other incisions. The uterus is cut into small pieces. The small pieces are removed through the incisions, or they are removed through the vagina.  Laparoscopically assisted vaginal hysterectomy (LAVH)-Three or four small incisions are made in the abdomen. Part of the surgery is performed laparoscopically and part vaginally. The uterus is removed through the vagina.  Robot-assisted laparoscopic hysterectomy-A laparoscope and other tools are inserted into 3 or 4 small incisions in the abdomen. A computer-controlled device is used to give the surgeon a 3D image and to help control the surgical instruments. This allows for more precise movements of surgical instruments. The uterus is cut into small pieces and removed through the incisions or removed through the vagina. What are the risks? Possible complications associated with this procedure include:  Bleeding and risk of blood transfusion. Tell your health care provider if you do not want to receive any blood products.  Blood clots in the legs or lung.  Infection.  Injury to surrounding organs.  Problems or side effects related to anesthesia.  Conversion to an abdominal hysterectomy from one of the other techniques. What to expect after a hysterectomy  You will be given pain medicine.  You will need to have someone with you for the first 3-5 days after you go home.  You will need to follow up with your surgeon in 2-4 weeks  after surgery to evaluate your progress.  You may have early menopause symptoms such as hot flashes, night sweats, and insomnia.  If you had a  hysterectomy for a problem that was not cancer or not a condition that could lead to cancer, then you no longer need Pap tests. However, even if you no longer need a Pap test, a regular exam is a good idea to make sure no other problems are starting. This information is not intended to replace advice given to you by your health care provider. Make sure you discuss any questions you have with your health care provider. Document Released: 11/19/2000 Document Revised: 11/01/2015 Document Reviewed: 01/31/2013 Elsevier Interactive Patient Education  2017 [employer].

## 2016-09-12 NOTE — Progress Notes (Signed)
GYNECOLOGY PROGRESS NOTE  Subjective:    Patient ID: Alisha Kim, female    DOB: 1971-01-16, 46 y.o.   MRN: [id number]  HPI  Patient is a 46 y.o. G42P1001 female who presents for discussion of endometrial biopsy results for uterine fibroids, dysmenorrhea, pelvic pain, and abnormal uterine bleeding.  Patient also with right hydrosalpinx. Patient notes that she would like to discuss definitive management with hysterectomy as she may be losing her insurance soon due to changes with her job.   The following portions of the patient's history were reviewed and updated as appropriate: allergies, current medications, past family history, past medical history, past social history, past surgical history and problem list.  Review of Systems Pertinent items noted in HPI and remainder of comprehensive ROS otherwise negative.   Objective:   Blood pressure 120/75, pulse 84, height 5\' 6"  (1.676 m), weight 220 lb 1.6 oz (99.8 kg), last menstrual period 09/03/2016. General appearance: alert and no distress Remainder of exam deferred.     Pathology:  Orders Only on 09/04/2016  Component Date Value Ref Range Status  . PATH REPORT.SITE OF ORIGIN [facility] 09/04/2016 Comment   Final   Comment: Material submitted:                                        Marland Kitchen ENDOMETRIUM, BIOPSY   . . 09/04/2016 Comment   Final   Comment: Clinician provided ICD-10: N93.9 R10.2   . PATH REPORT.FINAL DX [facility] 09/04/2016 Comment   Final   Comment:  Diagnosis: ENDOMETRIUM, BIOPSY: PROLIFERATIVE PHASE ENDOMETRIUM, NEGATIVE FOR HYPERPLASIA AND/OR MALIGNANCY. BENIGN ENDOMETRIAL POLYP. SOL/09/08/2016    . SIGNED OUT BY: 09/04/2016 Comment   Final   Comment: Electronically signed:                                     Marland Kitchen Galvin Proffer, MD, Pathologist   . GROSS DESCRIPTION: 09/04/2016 Comment   Final   Comment: Johney Maine description:                                         . 1 Container, formalin-filled, labeled with  patient identification. ENDOMETRIUM, BIOPSY: Received in formalin is 2 X 2 X .2 cm in aggregate of mucinous, tan and brown material.  Tissue is submitted in toto in 1 cassette(s). /SBB /SBB   . . 09/04/2016 Comment   Final   Comment: Pathologist provided ICD-10: N84.0   . PAYMENT PROCEDURE 09/04/2016 Comment   Final   Comment: CPT                                                        . [id number]     Assessment:   Abnormal uterine bleeding Fibroid uterus Dysmenorrhea Pelvic pain (right-sided) Right hydrosalpinx Endometrial polyp.   Plan:   - Discussed biopsy results with patient. Newly diagnosed endometrial polyp noted.  - Discussed management options again for abnormal uterine bleeding including tranexamic acid (Lysteda), oral progesterone (Megace), Depo Provera, Mirena IUD, endometrial ablation (Novasure/Hydrothermal Ablation) or hysterectomy as  definitive surgical management.  Discussed risks and benefits of each method.   Patient desires definitive management with hysterectomy.  Patient desires definitive management with hysterectomy.  I proposed doing a total vaginal hysterectomy (TVH) with possible laparoscopic assistance and prophylactic bilateral salpingectomy.  No indication for oophorectomy at this time.  Patient agrees with this proposed surgery.  The risks of surgery were discussed in detail with the patient including but not limited to: bleeding which may require transfusion or reoperation; infection which may require antibiotics; injury to bowel, bladder, ureters or other surrounding organs; need for additional procedures including laparotomy; thromboembolic phenomenon, incisional problems and other postoperative/anesthesia complications.  Patient was also advised that she will remain in house for 1-2 nights; and expected recovery time after a hysterectomy is 6-8 weeks.  Likelihood of success in alleviating the patient's symptoms was discussed.   She was told that she will  be contacted by our surgical scheduler regarding the time and date of her surgery; routine preoperative instructions of having nothing to eat or drink after midnight on the day prior to surgery and also coming to the hospital 1.5 hours prior to her time of surgery were also emphasized.  She was told she may be called for a preoperative appointment about a week prior to surgery and will be given further preoperative instructions at that visit.  Routine postoperative instructions will be reviewed with the patient and her family in detail after surgery. Bleeding precautions were reviewed. Patient's surgery to be scheduled for 09/29/2016.  Printed patient education handouts about the procedure was given to the patient to review at home.   A total of 25 minutes were spent face-to-face with the patient during this encounter and over half of that time involved counseling and coordination of care.    [name], MD [facility]

## 2016-09-14 ENCOUNTER — Encounter: Payer: Self-pay | Admitting: Obstetrics and Gynecology

## 2016-09-14 NOTE — H&P (Addendum)
GYNECOLOGY PRE-OPERATIVE HISTORY AND PHYSICAL Subjective:    Patient is a 46 y.o. G61P1001 female scheduled for TVH with bilateral salpingectomy. Indications for procedure are fibroid uterus, abnormal uterine bleeding, endometrial polyp, dysmenorrhea, pelvic pain and right hydrosalpinx.   Pertinent Gynecological History: Menses: irregular occurring approximately every 12-16 days days without intermenstrual spotting.  Flow is heavy.  Associated with moderate to severe dsmenorrhea.  Contraception: none Last mammogram: normal Date: 07/2014 Last pap: normal Date: 07/2015  Discussed Blood/Blood Products: yes   Menstrual History: OB History    Gravida Para Term Preterm AB Living   1 1 1     1    SAB TAB Ectopic Multiple Live Births           1      Menarche age: 61 Patient's last menstrual period was 09/03/2016.    Past Medical History:  Diagnosis Date  . Allergy   . Anemia    history of  . Anxiety   . Asthma   . Diabetes mellitus without complication ([place])    was told she was pre-diabetic  . Fibroids 08/14/2016   [place] March 2018  . History of Helicobacter pylori infection 08/08/2016   2004  . 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 Hypertension     Past Surgical History:  Procedure Laterality Date  . NO PAST SURGERIES      OB History  Gravida Para Term Preterm AB Living  1 1 1     1   SAB TAB Ectopic Multiple Live Births          1    # Outcome Date GA Lbr Len/2nd Weight Sex Delivery Anes PTL Lv  1 Term 04/18/02 [redacted]w[redacted]d   F Vag-Spont   LIV      Social History   Social History  . Marital status: Married    Spouse name: N/A  . Number of children: N/A  . Years of education: N/A   Social History Main Topics  . Smoking status: Never Smoker  . Smokeless tobacco: Never Used  . Alcohol use 0.0 oz/week     Comment: seldom use  . Drug use: No  . Sexual activity: Yes    Partners: Male    Birth control/ protection: None, Condom   Other Topics Concern  . Not on file    Social History Narrative  . No narrative on file    Family History  Problem Relation Age of Onset  . Alcohol abuse Mother   . Arthritis Mother   . Asthma Mother   . Depression Mother   . Drug abuse Mother   . Hypertension Mother   . Mental illness Mother   . Cancer Mother     breast and lung  . Alcohol abuse Father   . Drug abuse Father   . Cancer Father     lung  . Hypertension Brother   . Hearing loss Maternal Aunt   . Hypertension Maternal Aunt   . Stroke Maternal Aunt   . Alzheimer's disease Maternal Aunt   . Cancer Maternal Grandmother     cervical  . Heart disease Maternal Grandmother   . Hypertension Maternal Grandmother     Current Outpatient Prescriptions on File Prior to Visit  Medication Sig Dispense Refill  . albuterol (PROAIR HFA) 108 (90 Base) MCG/ACT inhaler Inhale 1-2 puffs into the lungs every 6 (six) hours as needed for wheezing or shortness of breath. 18 g 5  . amLODipine (NORVASC) 5 MG tablet Take  1 tablet (5 mg total) by mouth daily. 90 tablet 1  . diphenhydrAMINE (BENADRYL) 25 mg capsule Take by mouth as needed.     . doxycycline (VIBRAMYCIN) 100 MG capsule Take 1 capsule (100 mg total) by mouth 2 (two) times daily. 14 capsule 0  . Fluticasone-Salmeterol (ADVAIR DISKUS) 250-50 MCG/DOSE AEPB Inhale 1 puff into the lungs 2 (two) times daily. 60 each 5  . hydrOXYzine (ATARAX/VISTARIL) 25 MG tablet Take 25 mg by mouth 2 (two) times daily as needed.    . Multiple Vitamin (MULTIVITAMIN) tablet Take 1 tablet by mouth daily.     Current Facility-Administered Medications on File Prior to Visit  Medication Dose Route Frequency Provider Last Rate Last Dose  . ipratropium-albuterol (DUONEB) 0.5-2.5 (3) MG/3ML nebulizer solution 3 mL  3 mL Nebulization Once Versie Starks, PA-C         Allergies  Allergen Reactions  . Meloxicam Other (See Comments)    Felt horrible, headache, bloating, back pain, SHOB; no rash  . Zithromax [Azithromycin] Swelling     Tongue   . Pseudoephedrine-Guaifenesin Swelling    Of tongue  . Shellfish Allergy Rash    Review of Systems Constitutional: No recent fever/chills/sweats Respiratory: No recent cough/bronchitis Cardiovascular: No chest pain Gastrointestinal: No recent nausea/vomiting/diarrhea Genitourinary: No UTI symptoms Hematologic/lymphatic:No history of coagulopathy or recent blood thinner use    Objective:    BP 120/75 (BP Location: Left Arm, Patient Position: Sitting, Cuff Size: Large)   Pulse 84   Ht 5\' 6"  (1.676 m)   Wt 220 lb 1.6 oz (99.8 kg)   LMP 09/03/2016   BMI 35.53 kg/m   General:   Normal  Skin:   normal  HEENT:  Normal  Neck:  Supple without Adenopathy or Thyromegaly  Lungs:   Heart:              Breasts:   Abdomen:  Pelvis:  M/S   Extremeties:  Neuro:    clear to auscultation bilaterally   Normal without murmur   Not Examined   soft, non-tender; bowel sounds normal; no masses,  no organomegaly   Exam deferred to OR  No CVAT  Warm/Dry  Normal         Labs:  Lab Results  Component Value Date   WBC 6.0 07/07/2016   HGB 12.7 07/07/2016   HCT 37.5 07/07/2016   MCV 78.7 (L) 07/07/2016   PLT 248 07/07/2016    Lab Results  Component Value Date   TSH 0.98 08/01/2016    Pathology:  Orders Only on 09/04/2016  Component Date Value Ref Range Status  . PATH REPORT.SITE OF ORIGIN [facility] 09/04/2016 Comment   Final   Comment: Material submitted:                                        Marland Kitchen ENDOMETRIUM, BIOPSY   . . 09/04/2016 Comment   Final   Comment: Clinician provided ICD-10: N93.9 R10.2   . PATH REPORT.FINAL DX [facility] 09/04/2016 Comment   Final   Comment:  Diagnosis: ENDOMETRIUM, BIOPSY: PROLIFERATIVE PHASE ENDOMETRIUM, NEGATIVE FOR HYPERPLASIA AND/OR MALIGNANCY. BENIGN ENDOMETRIAL POLYP. SOL/09/08/2016    . SIGNED OUT BY: 09/04/2016 Comment   Final   Comment: Electronically signed:                                     .  Galvin Proffer, MD,  Pathologist   . GROSS DESCRIPTION: 09/04/2016 Comment   Final   Comment: Johney Maine description:                                         . 1 Container, formalin-filled, labeled with patient identification. ENDOMETRIUM, BIOPSY: Received in formalin is 2 X 2 X .2 cm in aggregate of mucinous, tan and brown material.  Tissue is submitted in toto in 1 cassette(s). /SBB /SBB   . . 09/04/2016 Comment   Final   Comment: Pathologist provided ICD-10: N84.0   . PAYMENT PROCEDURE 09/04/2016 Comment   Final   Comment: CPT                                                        . [id number]      Imaging (08/13/2016 Pelvic Ultrasound):   CLINICAL DATA:  Right-sided pelvic pain with abnormal uterine bleeding for 2-3 months.  EXAM: TRANSABDOMINAL AND TRANSVAGINAL ULTRASOUND OF PELVIS  TECHNIQUE: Both transabdominal and transvaginal ultrasound examinations of the pelvis were performed. Transabdominal technique was performed for global imaging of the pelvis including uterus, ovaries, adnexal regions, and pelvic cul-de-sac. It was necessary to proceed with endovaginal exam following the transabdominal exam to visualize the 03/17/2006.  COMPARISON:  03/17/2006  FINDINGS: Uterus  Measurements: 9.3 x 5.1 x 6.4 cm. Multiple fibroids are identified, measuring up to 2.6 cm.  Endometrium  Thickness: 4 mm.  No focal abnormality visualized.  Right ovary  Measurements: 5.1 x 2.2 x 3.2 cm. Multiple follicles evident.  Left ovary  Measurements: 4.0 x 2.7 x 3.3 cm. Multiple follicles.  Other findings  In the right adnexal space, sonographer identifies an elongated cystic area measuring 4.3 x 2.9 x 2.1 cm. This is in the region of the uterine fundus tracking towards the right ovary and while sonographic appearance is not classic for a dilated fallopian tube, short segment hydrosalpinx is a consideration.  Moderate intraperitoneal free fluid is identified in the cul-de-sac and  right adnexal space.  IMPRESSION: 1. Multiple uterine fibroids. 2. No endometrial abnormality. 3. Elongated cystic structure right adnexal space, question short segment hydrosalpinx on the right. Follow-up ultrasound in 6-12 weeks could be used to ensure stability. 4. Moderate intraperitoneal free fluid.   Assessment:   Abnormal uterine bleeding  Fibroid uterus Dysmenorrhea Pelvic pain Right hydrosalpinx Endometrial polyp  Plan:    Counseling: Procedure, risks, reasons, benefits and complications (including injury to bowel, bladder, major blood vessel, ureter, bleeding, possibility of transfusion, infection, or fistula formation) reviewed in detail. Patient scheduled for [facility] with bilateral salpingectomy on 09/29/2016.  Preop testing ordered. Instructions reviewed, including NPO after midnight.      [name], MD [facility]

## 2016-09-16 ENCOUNTER — Ambulatory Visit: Payer: Self-pay | Admitting: Family Medicine

## 2016-09-17 ENCOUNTER — Encounter: Payer: Self-pay | Admitting: Obstetrics and Gynecology

## 2016-09-18 ENCOUNTER — Encounter: Payer: 59 | Admitting: Obstetrics and Gynecology

## 2016-09-23 ENCOUNTER — Encounter
Admission: RE | Admit: 2016-09-23 | Discharge: 2016-09-23 | Disposition: A | Discharge status: Home / Self Care | Payer: 59 | Source: Ambulatory Visit | Attending: Obstetrics and Gynecology | Admitting: Obstetrics and Gynecology

## 2016-09-23 DIAGNOSIS — Z01812 Encounter for preprocedural laboratory examination: Secondary | ICD-10-CM | POA: Insufficient documentation

## 2016-09-23 DIAGNOSIS — Z01810 Encounter for preprocedural cardiovascular examination: Secondary | ICD-10-CM | POA: Insufficient documentation

## 2016-09-23 DIAGNOSIS — I10 Essential (primary) hypertension: Secondary | ICD-10-CM | POA: Insufficient documentation

## 2016-09-23 NOTE — Patient Instructions (Signed)
  Your procedure is scheduled on:09/29/16 Report to Day Surgery. [facility] SECOND FLOOR To find out your arrival time please call [phone] between 1PM - 3PM on 09/26/16  Remember: Instructions that are not followed completely may result in serious medical risk, up to and including death, or upon the discretion of your surgeon and anesthesiologist your surgery may need to be rescheduled.    _X___ 1. Do not eat food or drink liquids after midnight. No gum chewing or hard candies.     _X___ 2. No Alcohol for 24 hours before or after surgery.   _X___ 3. Do Not Smoke For 24 Hours Prior to Your Surgery.   ____ 4. Bring all medications with you on the day of surgery if instructed.    _X___ 5. Notify your doctor if there is any change in your medical condition     (cold, fever, infections).       Do not wear jewelry, make-up, hairpins, clips or nail polish.  Do not wear lotions, powders, or perfumes. You may wear deodorant.  Do not shave 48 hours prior to surgery. Men may shave face and neck.  Do not bring valuables to the hospital.    [facility] is not responsible for any belongings or valuables.               Contacts, dentures or bridgework may not be worn into surgery.  Leave your suitcase in the car. After surgery it may be brought to your room.  For patients admitted to the hospital, discharge time is determined by your                treatment team.   Patients discharged the day of surgery will not be allowed to drive home.   Please read over the following fact sheets that you were given:   Surgical Site Infection Prevention   ____ Take these medicines the morning of surgery with A SIP OF WATER:    1. NONE  2.   3.   4.  5.  6.  ____ Fleet Enema (as directed)   _X___ Use CHG Soap as directed  __X__ Use inhalers on the day of surgery  ____ Stop metformin 2 days prior to surgery    ____ Take 1/2 of usual insulin dose the night before surgery and none on the  morning of surgery.   ____ Stop Coumadin/Plavix/aspirin on   __X__ Stop Anti-inflammatories on      09/23/16 UNTIL AFTER SURGERY   ____ Stop supplements until after surgery.    ____ Bring C-Pap to the hospital.

## 2016-09-24 ENCOUNTER — Encounter: Payer: Self-pay | Admitting: Obstetrics and Gynecology

## 2016-09-26 ENCOUNTER — Encounter
Admission: RE | Admit: 2016-09-26 | Discharge: 2016-09-26 | Disposition: A | Discharge status: Home / Self Care | Payer: 59 | Source: Ambulatory Visit | Attending: Obstetrics and Gynecology | Admitting: Obstetrics and Gynecology

## 2016-09-26 DIAGNOSIS — Z01812 Encounter for preprocedural laboratory examination: Secondary | ICD-10-CM | POA: Diagnosis not present

## 2016-09-26 DIAGNOSIS — Z01818 Encounter for other preprocedural examination: Secondary | ICD-10-CM | POA: Diagnosis present

## 2016-09-26 DIAGNOSIS — Z01810 Encounter for preprocedural cardiovascular examination: Secondary | ICD-10-CM | POA: Diagnosis not present

## 2016-09-26 DIAGNOSIS — I10 Essential (primary) hypertension: Secondary | ICD-10-CM | POA: Diagnosis not present

## 2016-09-26 LAB — CBC
HEMATOCRIT: 38.7 % (ref 35.0–47.0)
Hemoglobin: 12.5 g/dL (ref 12.0–16.0)
MCH: 25.2 pg — ABNORMAL LOW (ref 26.0–34.0)
MCHC: 32.2 g/dL (ref 32.0–36.0)
MCV: 78.2 fL — AB (ref 80.0–100.0)
PLATELETS: 232 10*3/uL (ref 150–440)
RBC: 4.95 MIL/uL (ref 3.80–5.20)
RDW: 16.3 % — ABNORMAL HIGH (ref 11.5–14.5)
WBC: 4.7 10*3/uL (ref 3.6–11.0)

## 2016-09-26 LAB — BASIC METABOLIC PANEL
Anion gap: 6 (ref 5–15)
BUN: 9 mg/dL (ref 6–20)
CHLORIDE: 103 mmol/L (ref 101–111)
CO2: 27 mmol/L (ref 22–32)
Calcium: 8.5 mg/dL — ABNORMAL LOW (ref 8.9–10.3)
Creatinine, Ser: 0.59 mg/dL (ref 0.44–1.00)
GFR calc non Af Amer: >60 mL/min (ref >60)
Glucose, Bld: 98 mg/dL (ref 65–99)
Potassium: 3.3 mmol/L — ABNORMAL LOW (ref 3.5–5.1)
SODIUM: 136 mmol/L (ref 135–145)

## 2016-09-26 LAB — RAPID HIV SCREEN (HIV 1/2 AB+AG)
HIV 1/2 ANTIBODIES: NONREACTIVE
HIV-1 P24 Antigen - HIV24: NONREACTIVE

## 2016-09-26 LAB — TYPE AND SCREEN
ABO/RH(D): O POS
ANTIBODY SCREEN: NEGATIVE

## 2016-09-26 NOTE — Pre-Procedure Instructions (Signed)
Potassium results sent to Dr. Marcelline Mates and Anesthesia for review.

## 2016-09-27 LAB — RPR: RPR Ser Ql: NONREACTIVE

## 2016-09-27 LAB — HEPATITIS C ANTIBODY: HCV Ab: <0.1 s/co ratio (ref 0.0–0.9)

## 2016-09-27 LAB — HEPATITIS B SURFACE ANTIGEN: Hepatitis B Surface Ag: NEGATIVE

## 2016-09-28 MED ORDER — CEFAZOLIN SODIUM-DEXTROSE 2-4 GM/100ML-% IV SOLN
2.0000 g | INTRAVENOUS | Status: AC
  Administered 2016-09-29: 2 g via INTRAVENOUS

## 2016-09-29 ENCOUNTER — Inpatient Hospital Stay
Admission: AD | Admit: 2016-09-29 | Discharge: 2016-09-30 | DRG: 743 | Disposition: A | Discharge status: Home / Self Care | Payer: 59 | Source: Ambulatory Visit | Attending: Obstetrics and Gynecology | Admitting: Obstetrics and Gynecology

## 2016-09-29 ENCOUNTER — Ambulatory Visit: Payer: 59 | Admitting: Anesthesiology

## 2016-09-29 ENCOUNTER — Encounter
Admission: AD | Disposition: A | Discharge status: Home / Self Care | Payer: Self-pay | Source: Ambulatory Visit | Attending: Obstetrics and Gynecology

## 2016-09-29 ENCOUNTER — Encounter: Payer: Self-pay | Admitting: Anesthesiology

## 2016-09-29 DIAGNOSIS — Q504 Embryonic cyst of fallopian tube: Secondary | ICD-10-CM | POA: Diagnosis not present

## 2016-09-29 DIAGNOSIS — N946 Dysmenorrhea, unspecified: Secondary | ICD-10-CM | POA: Diagnosis not present

## 2016-09-29 DIAGNOSIS — N83202 Unspecified ovarian cyst, left side: Secondary | ICD-10-CM | POA: Diagnosis present

## 2016-09-29 DIAGNOSIS — N840 Polyp of corpus uteri: Secondary | ICD-10-CM | POA: Diagnosis present

## 2016-09-29 DIAGNOSIS — N83201 Unspecified ovarian cyst, right side: Secondary | ICD-10-CM | POA: Diagnosis present

## 2016-09-29 DIAGNOSIS — D259 Leiomyoma of uterus, unspecified: Secondary | ICD-10-CM | POA: Diagnosis not present

## 2016-09-29 DIAGNOSIS — N800 Endometriosis of uterus: Secondary | ICD-10-CM | POA: Diagnosis not present

## 2016-09-29 DIAGNOSIS — Z90710 Acquired absence of both cervix and uterus: Secondary | ICD-10-CM | POA: Diagnosis present

## 2016-09-29 DIAGNOSIS — R102 Pelvic and perineal pain: Secondary | ICD-10-CM | POA: Diagnosis present

## 2016-09-29 DIAGNOSIS — N939 Abnormal uterine and vaginal bleeding, unspecified: Secondary | ICD-10-CM | POA: Diagnosis present

## 2016-09-29 HISTORY — PX: VAGINAL HYSTERECTOMY: SHX2639

## 2016-09-29 LAB — POCT PREGNANCY, URINE: Preg Test, Ur: NEGATIVE

## 2016-09-29 LAB — POCT I-STAT 4, (NA,K, GLUC, HGB,HCT)
Glucose, Bld: 118 mg/dL — ABNORMAL HIGH (ref 65–99)
HCT: 36 % (ref 36.0–46.0)
Hemoglobin: 12.2 g/dL (ref 12.0–15.0)
POTASSIUM: 3.1 mmol/L — AB (ref 3.5–5.1)
SODIUM: 140 mmol/L (ref 135–145)

## 2016-09-29 LAB — ABO/RH: ABO/RH(D): O POS

## 2016-09-29 LAB — GLUCOSE, CAPILLARY: Glucose-Capillary: 111 mg/dL — ABNORMAL HIGH (ref 65–99)

## 2016-09-29 SURGERY — HYSTERECTOMY, VAGINAL
Anesthesia: General | Laterality: Bilateral | Wound class: Clean Contaminated

## 2016-09-29 MED ORDER — ROCURONIUM BROMIDE 100 MG/10ML IV SOLN
INTRAVENOUS | Status: DC | PRN
  Administered 2016-09-29: 30 mg via INTRAVENOUS
  Administered 2016-09-29: 20 mg via INTRAVENOUS

## 2016-09-29 MED ORDER — MENTHOL 3 MG MT LOZG
1.0000 | LOZENGE | OROMUCOSAL | Status: DC | PRN
  Filled 2016-09-29: qty 9

## 2016-09-29 MED ORDER — ACETAMINOPHEN 10 MG/ML IV SOLN
INTRAVENOUS | Status: DC | PRN
  Administered 2016-09-29: 1000 mg via INTRAVENOUS

## 2016-09-29 MED ORDER — LACTATED RINGERS IV SOLN: INTRAVENOUS | Status: DC

## 2016-09-29 MED ORDER — ACETAMINOPHEN 10 MG/ML IV SOLN
INTRAVENOUS | Status: AC
  Filled 2016-09-29: qty 100

## 2016-09-29 MED ORDER — VASOPRESSIN 20 UNIT/ML IV SOLN
INTRAVENOUS | Status: AC
  Filled 2016-09-29: qty 1

## 2016-09-29 MED ORDER — ONDANSETRON HCL 4 MG/2ML IJ SOLN: 4.0000 mg | Freq: Four times a day (QID) | INTRAMUSCULAR | Status: DC | PRN

## 2016-09-29 MED ORDER — LACTATED RINGERS IV SOLN
INTRAVENOUS | Status: DC
  Administered 2016-09-29: 14:00:00 via INTRAVENOUS
  Administered 2016-09-29: 1000 mL via INTRAVENOUS

## 2016-09-29 MED ORDER — POTASSIUM CHLORIDE CRYS ER 20 MEQ PO TBCR
40.0000 meq | EXTENDED_RELEASE_TABLET | Freq: Two times a day (BID) | ORAL | Status: DC
  Administered 2016-09-29 – 2016-09-30 (×3): 40 meq via ORAL
  Filled 2016-09-29 (×3): qty 2

## 2016-09-29 MED ORDER — ALUM & MAG HYDROXIDE-SIMETH 200-200-20 MG/5ML PO SUSP: 30.0000 mL | ORAL | Status: DC | PRN

## 2016-09-29 MED ORDER — PROPOFOL 10 MG/ML IV BOLUS
INTRAVENOUS | Status: DC | PRN
Start: 2016-09-29 — End: 2016-09-29
  Administered 2016-09-29: 120 mg via INTRAVENOUS

## 2016-09-29 MED ORDER — MAGNESIUM CITRATE PO SOLN
1.0000 | Freq: Once | ORAL | Status: DC | PRN
  Filled 2016-09-29: qty 296

## 2016-09-29 MED ORDER — AMLODIPINE BESYLATE 5 MG PO TABS
5.0000 mg | ORAL_TABLET | Freq: Every day | ORAL | Status: DC
  Administered 2016-09-29: 5 mg via ORAL
  Filled 2016-09-29 (×2): qty 1

## 2016-09-29 MED ORDER — KETOROLAC TROMETHAMINE 30 MG/ML IJ SOLN
INTRAMUSCULAR | Status: DC | PRN
Start: 2016-09-29 — End: 2016-09-29
  Administered 2016-09-29: 30 mg via INTRAVENOUS

## 2016-09-29 MED ORDER — AMLODIPINE BESYLATE 5 MG PO TABS: 5.0000 mg | ORAL_TABLET | Freq: Every day | ORAL | Status: DC

## 2016-09-29 MED ORDER — FAMOTIDINE 20 MG PO TABS
ORAL_TABLET | ORAL | Status: AC
  Administered 2016-09-29: 20 mg via ORAL
  Filled 2016-09-29: qty 1

## 2016-09-29 MED ORDER — HYDROMORPHONE HCL 1 MG/ML IJ SOLN
0.2000 mg | INTRAMUSCULAR | Status: DC | PRN
  Administered 2016-09-29 – 2016-09-30 (×3): 0.2 mg via INTRAVENOUS
  Filled 2016-09-29 (×3): qty 1

## 2016-09-29 MED ORDER — ONDANSETRON HCL 4 MG PO TABS: 4.0000 mg | ORAL_TABLET | Freq: Four times a day (QID) | ORAL | Status: DC | PRN

## 2016-09-29 MED ORDER — OXYCODONE-ACETAMINOPHEN 5-325 MG PO TABS
1.0000 | ORAL_TABLET | ORAL | Status: DC | PRN
  Administered 2016-09-29 – 2016-09-30 (×2): 1 via ORAL
  Filled 2016-09-29 (×2): qty 1

## 2016-09-29 MED ORDER — SOD CITRATE-CITRIC ACID 500-334 MG/5ML PO SOLN
30.0000 mL | ORAL | Status: DC
Start: 2016-09-29 — End: 2016-09-29

## 2016-09-29 MED ORDER — SUGAMMADEX SODIUM 200 MG/2ML IV SOLN
INTRAVENOUS | Status: AC
  Filled 2016-09-29: qty 2

## 2016-09-29 MED ORDER — ONDANSETRON HCL 4 MG/2ML IJ SOLN
INTRAMUSCULAR | Status: DC | PRN
  Administered 2016-09-29: 4 mg via INTRAVENOUS

## 2016-09-29 MED ORDER — IBUPROFEN 600 MG PO TABS
600.0000 mg | ORAL_TABLET | Freq: Four times a day (QID) | ORAL | Status: DC | PRN
  Administered 2016-09-30: 600 mg via ORAL
  Filled 2016-09-29: qty 1

## 2016-09-29 MED ORDER — MIDAZOLAM HCL 2 MG/2ML IJ SOLN
INTRAMUSCULAR | Status: DC | PRN
  Administered 2016-09-29: 2 mg via INTRAVENOUS

## 2016-09-29 MED ORDER — PROPOFOL 10 MG/ML IV BOLUS
INTRAVENOUS | Status: AC
  Filled 2016-09-29: qty 20

## 2016-09-29 MED ORDER — FAMOTIDINE 20 MG PO TABS
20.0000 mg | ORAL_TABLET | Freq: Once | ORAL | Status: AC
  Administered 2016-09-29: 20 mg via ORAL

## 2016-09-29 MED ORDER — ZOLPIDEM TARTRATE 5 MG PO TABS: 5.0000 mg | ORAL_TABLET | Freq: Every evening | ORAL | Status: DC | PRN

## 2016-09-29 MED ORDER — FENTANYL CITRATE (PF) 100 MCG/2ML IJ SOLN
INTRAMUSCULAR | Status: AC
  Administered 2016-09-29: 25 ug via INTRAVENOUS
  Filled 2016-09-29: qty 2

## 2016-09-29 MED ORDER — SENNOSIDES-DOCUSATE SODIUM 8.6-50 MG PO TABS
1.0000 | ORAL_TABLET | Freq: Every evening | ORAL | Status: DC | PRN
Start: 2016-09-29 — End: 2016-09-30

## 2016-09-29 MED ORDER — SODIUM CHLORIDE 0.9 % IJ SOLN
INTRAMUSCULAR | Status: AC
  Filled 2016-09-29: qty 50

## 2016-09-29 MED ORDER — ESTROGENS, CONJUGATED 0.625 MG/GM VA CREA
TOPICAL_CREAM | VAGINAL | Status: AC
  Filled 2016-09-29: qty 30

## 2016-09-29 MED ORDER — PROMETHAZINE HCL 25 MG/ML IJ SOLN: 6.2500 mg | INTRAMUSCULAR | Status: DC | PRN

## 2016-09-29 MED ORDER — FENTANYL CITRATE (PF) 100 MCG/2ML IJ SOLN
INTRAMUSCULAR | Status: DC | PRN
  Administered 2016-09-29 (×2): 50 ug via INTRAVENOUS
  Administered 2016-09-29: 100 ug via INTRAVENOUS

## 2016-09-29 MED ORDER — LIDOCAINE HCL (CARDIAC) 20 MG/ML IV SOLN
INTRAVENOUS | Status: DC | PRN
  Administered 2016-09-29: 50 mg via INTRAVENOUS

## 2016-09-29 MED ORDER — FENTANYL CITRATE (PF) 100 MCG/2ML IJ SOLN
INTRAMUSCULAR | Status: AC
  Filled 2016-09-29: qty 2

## 2016-09-29 MED ORDER — DEXAMETHASONE SODIUM PHOSPHATE 10 MG/ML IJ SOLN
INTRAMUSCULAR | Status: AC
  Filled 2016-09-29: qty 1

## 2016-09-29 MED ORDER — KETOROLAC TROMETHAMINE 30 MG/ML IJ SOLN: 30.0000 mg | Freq: Once | INTRAMUSCULAR | Status: DC

## 2016-09-29 MED ORDER — BISACODYL 10 MG RE SUPP: 10.0000 mg | Freq: Every day | RECTAL | Status: DC | PRN

## 2016-09-29 MED ORDER — PANTOPRAZOLE SODIUM 40 MG PO TBEC
40.0000 mg | DELAYED_RELEASE_TABLET | Freq: Every day | ORAL | Status: DC
  Administered 2016-09-29 – 2016-09-30 (×2): 40 mg via ORAL
  Filled 2016-09-29 (×2): qty 1

## 2016-09-29 MED ORDER — SIMETHICONE 80 MG PO CHEW
80.0000 mg | CHEWABLE_TABLET | Freq: Four times a day (QID) | ORAL | Status: DC | PRN
  Administered 2016-09-30: 80 mg via ORAL
  Filled 2016-09-29: qty 1

## 2016-09-29 MED ORDER — ONDANSETRON HCL 4 MG/2ML IJ SOLN
INTRAMUSCULAR | Status: AC
  Filled 2016-09-29: qty 2

## 2016-09-29 MED ORDER — CEFAZOLIN SODIUM-DEXTROSE 2-4 GM/100ML-% IV SOLN
INTRAVENOUS | Status: AC
  Filled 2016-09-29: qty 100

## 2016-09-29 MED ORDER — DEXAMETHASONE SODIUM PHOSPHATE 10 MG/ML IJ SOLN
INTRAMUSCULAR | Status: DC | PRN
  Administered 2016-09-29: 10 mg via INTRAVENOUS

## 2016-09-29 MED ORDER — LIDOCAINE HCL (PF) 2 % IJ SOLN
INTRAMUSCULAR | Status: AC
  Filled 2016-09-29: qty 2

## 2016-09-29 MED ORDER — SODIUM CHLORIDE 0.9 % IV SOLN
INTRAVENOUS | Status: DC
  Administered 2016-09-29 (×2): via INTRAVENOUS

## 2016-09-29 MED ORDER — ROCURONIUM BROMIDE 50 MG/5ML IV SOLN
INTRAVENOUS | Status: AC
  Filled 2016-09-29: qty 1

## 2016-09-29 MED ORDER — KETOROLAC TROMETHAMINE 30 MG/ML IJ SOLN
INTRAMUSCULAR | Status: AC
  Filled 2016-09-29: qty 1

## 2016-09-29 MED ORDER — SUGAMMADEX SODIUM 200 MG/2ML IV SOLN
INTRAVENOUS | Status: DC | PRN
  Administered 2016-09-29: 200 mg via INTRAVENOUS

## 2016-09-29 MED ORDER — FENTANYL CITRATE (PF) 100 MCG/2ML IJ SOLN
25.0000 ug | INTRAMUSCULAR | Status: DC | PRN
  Administered 2016-09-29 (×4): 25 ug via INTRAVENOUS

## 2016-09-29 MED ORDER — MIDAZOLAM HCL 2 MG/2ML IJ SOLN
INTRAMUSCULAR | Status: AC
  Filled 2016-09-29: qty 2

## 2016-09-29 MED ORDER — DOCUSATE SODIUM 100 MG PO CAPS
100.0000 mg | ORAL_CAPSULE | Freq: Two times a day (BID) | ORAL | Status: DC
  Administered 2016-09-29 – 2016-09-30 (×3): 100 mg via ORAL
  Filled 2016-09-29 (×3): qty 1

## 2016-09-29 SURGICAL SUPPLY — 33 items
BAG COUNTER SPONGE EZ (MISCELLANEOUS) ×2 IMPLANT
BAG URO DRAIN 2000ML W/SPOUT (MISCELLANEOUS) ×2 IMPLANT
CANISTER SUCT 1200ML W/VALVE (MISCELLANEOUS) ×2 IMPLANT
CATH FOLEY 2WAY  5CC 16FR (CATHETERS) ×1
CATH URTH 16FR FL 2W BLN LF (CATHETERS) ×1 IMPLANT
DRAPE PERI LITHO V/GYN (MISCELLANEOUS) ×2 IMPLANT
DRAPE SHEET LG 3/4 BI-LAMINATE (DRAPES) ×2 IMPLANT
ELECT CAUTERY BLADE 6.4 (BLADE) ×2 IMPLANT
ELECT REM PT RETURN 9FT ADLT (ELECTROSURGICAL) ×2
ELECTRODE REM PT RTRN 9FT ADLT (ELECTROSURGICAL) ×1 IMPLANT
GAUZE PACK 2X3YD (MISCELLANEOUS) ×2 IMPLANT
GLOVE BIO SURGEON STRL SZ 6.5 (GLOVE) ×2 IMPLANT
GLOVE INDICATOR 7.0 STRL GRN (GLOVE) ×2 IMPLANT
GOWN STRL REUS W/ TWL LRG LVL3 (GOWN DISPOSABLE) ×2 IMPLANT
GOWN STRL REUS W/TWL LRG LVL3 (GOWN DISPOSABLE) ×2
KIT RM TURNOVER CYSTO AR (KITS) ×2 IMPLANT
LABEL OR SOLS (LABEL) ×2 IMPLANT
NEEDLE HYPO 22GX1.5 SAFETY (NEEDLE) ×2 IMPLANT
NEEDLE HYPO 25GX1 SAFETY (NEEDLE) ×2 IMPLANT
NS IRRIG 500ML POUR BTL (IV SOLUTION) ×2 IMPLANT
PACK BASIN MINOR ARMC (MISCELLANEOUS) ×2 IMPLANT
PAD OB MATERNITY 4.3X12.25 (PERSONAL CARE ITEMS) ×2 IMPLANT
PAD PREP 24X41 OB/GYN DISP (PERSONAL CARE ITEMS) ×2 IMPLANT
SOL PREP PVP 2OZ (MISCELLANEOUS) ×2
SOLUTION PREP PVP 2OZ (MISCELLANEOUS) ×1 IMPLANT
SUT CHROMIC 0 CT 1 (SUTURE) ×2 IMPLANT
SUT CHROMIC 1-0 (SUTURE) ×2 IMPLANT
SUT CHROMIC 2 0 CT 1 (SUTURE) ×2 IMPLANT
SUT VIC AB 0 CT1 27 (SUTURE) ×4
SUT VIC AB 0 CT1 27XCR 8 STRN (SUTURE) ×4 IMPLANT
SUT VIC AB 0 CT1 36 (SUTURE) ×2 IMPLANT
SYR CONTROL 10ML (SYRINGE) ×2 IMPLANT
SYRINGE 10CC LL (SYRINGE) ×2 IMPLANT

## 2016-09-29 NOTE — Anesthesia Procedure Notes (Signed)
Procedure Name: Intubation Date/Time: 09/29/2016 9:40 AM Performed by: Jonna Clark Pre-anesthesia Checklist: Patient identified, Patient being monitored, Timeout performed, Emergency Drugs available and Suction available Patient Re-evaluated:Patient Re-evaluated prior to inductionOxygen Delivery Method: Circle system utilized Preoxygenation: Pre-oxygenation with 100% oxygen Intubation Type: IV induction Ventilation: Mask ventilation without difficulty Laryngoscope Size: Mac and 3 Grade View: Grade II Tube type: Oral Tube size: 7.0 mm Number of attempts: 1 Airway Equipment and Method: Stylet Placement Confirmation: ETT inserted through vocal cords under direct vision,  positive ETCO2 and breath sounds checked- equal and bilateral Secured at: 22 cm Tube secured with: Tape Dental Injury: Teeth and Oropharynx as per pre-operative assessment

## 2016-09-29 NOTE — Op Note (Signed)
Total Vaginal Hysterectomy with Bilateral Salpingectomy Procedure Note  Indications: 46 y.o. female with fibroid uterus, abnormal uterine bleeding, endometrial polyp, dysmenorrhea, pelvic pain and right hydrosalpinx  Pre-operative Diagnosis:  fibroid uterus, abnormal uterine bleeding, endometrial polyp, dysmenorrhea, pelvic pain and right hydrosalpinx  Post-operative Diagnosis: Same, except no hydrosalpinx noted.  Bilateral simple ovarian cysts present (~3-4 cm each).   Operation: Total vaginal hysterectomy with bilateral salpingectomy  Surgeon: [name], MD  Assistants: Jeannie Fend, MD  Anesthesia: General endotracheal anesthesia   Procedure Details  The patient was seen in the Holding Room. The risks, benefits, complications, treatment options, and expected outcomes were discussed with the patient.  The patient concurred with the proposed plan, giving informed consent.  The site of surgery properly noted/marked. The patient was taken to the operating room, identified as Alisha Kim and the procedure verified as total vaginal hysterectomy with bilateral salpingectomy. A Time Out was held and the above information confirmed.  After induction of anesthesia, the patient was draped and prepped in the usual sterile manner. Pt was placed in supine position after anesthesia and draped and prepped in the usual sterile manner. Foley catheter was placed.  A weighted speculum was placed into the vagina and the cervix was grasped with a double-toothed tenaculum.  The cervix was then circumferentially injected with 10 cc of Vasopressin.  The cervix was then circumferentially incised with the bovie.  The posterior cul-de sac was then entered sharply with the Mayo scissors without difficulty.  The same procedure was performed anteriorly, and the bladder dissected of the pubovesical cervical fascia.    At this point, a Heaney clamp was placed over the uterosacaral ligaments on either side.  These  were then transected and suture ligated with 0-Vicryl.  Hemostasis was assured.  The cardinal ligaments were then clamped on both sides, transected and suture ligated in a similar fashion.    The uterine arteries and the broad ligament were then serially clamped with Heaney clamps, transected and suture ligated on both sides.  Excellent hemostasis was visualized.  Both cornua were clamped with Heaney clamps, transected and the uterus was delivered.  These pedicles were then suture ligated with excellent hemostasis.   Babcock clamps were used to grasp the left and right fallopian tubes, and they were clamped with Heaney clamps, transected and ligated with 0-Vicryl. Hemostasis noted to be achieved. The bilateral ovarian cysts were incised with the bovie and allowed to drain for better visualization of the pelvis.  Clear fluid was noted from each cyst. The left vaginal sidewall had a pedicle which was noted to have an area of bleeding.  A figure-of-eight suture was placed to achieve hemostasis.  No other abnormalities were noted in the pelvic cavity.After this, a modified Richardson stitch was performed, incorporating the uterosacral ligaments.   The vaginal cuff angles were closed with figure-of-eight stitches of 0-Vicryl on both sides and transfixed to the ipsilateral cardinal and uterosacral ligaments.  The remainder of the vaginal cuff was closed with figure-of-eight stitches of 0-Vicryl.   All instruments were then removed from the vagina.  The patient was then taken out of dorsal lithotomy position and awakened from general anesthesia.  The patient was taken to the PACU in stable condition.  Sponge, lap, needle, and instrument count was correct times two.     Findings: Uterus with multiple small fibroids, ~ 10-12 week sized.  Bilateral simple ovarian cysts (~ 3-4 cm each) with clear fluid on incision. Normal appearing fallopian tubes bilaterally.  Estimated Blood Loss:  150 ml          Drains: Foley catheter with 400 cc of clear urine at end of the procedure.         Total IV Fluids: 600 ml of Lactated Ringers         Specimens: Uterus with cervix, bilateral fallopian tubes         Implants: None         Complications:  None; patient tolerated the procedure well.         Disposition: PACU - hemodynamically stable.         Condition: stable   [name], MD [facility]

## 2016-09-29 NOTE — Anesthesia Post-op Follow-up Note (Cosign Needed)
Anesthesia QCDR form completed.        

## 2016-09-29 NOTE — Anesthesia Postprocedure Evaluation (Signed)
Anesthesia Post Note  Patient: Alisha Kim  Procedure(s) Performed: Procedure(s) (LRB): HYSTERECTOMY VAGINAL WITH BILATERAL SALPINGECTOMY (Bilateral)  Patient location during evaluation: PACU Anesthesia Type: General Level of consciousness: awake and alert Pain management: pain level controlled Vital Signs Assessment: post-procedure vital signs reviewed and stable Respiratory status: spontaneous breathing, nonlabored ventilation, respiratory function stable and patient connected to nasal cannula oxygen Cardiovascular status: blood pressure returned to baseline and stable Postop Assessment: no signs of nausea or vomiting Anesthetic complications: no     Last Vitals:  Vitals:   09/29/16 1245 09/29/16 1320  BP: (!) 143/89 126/78  Pulse: 72 70  Resp: 11 16  Temp:  36.4 C    Last Pain:  Vitals:   09/29/16 1320  TempSrc: Oral  PainSc:                  Martha Clan

## 2016-09-29 NOTE — Anesthesia Preprocedure Evaluation (Signed)
Anesthesia Evaluation  Patient identified by MRN, date of birth, ID band Patient awake    Reviewed: Allergy & Precautions, H&P , NPO status , Patient's Chart, lab work & pertinent test results, reviewed documented beta blocker date and time   History of Anesthesia Complications Negative for: history of anesthetic complications  Airway Mallampati: III  TM Distance: >3 FB Neck ROM: full    Dental  (+) Missing, Teeth Intact   Pulmonary neg shortness of breath, asthma , neg sleep apnea, neg COPD, neg recent URI,           Cardiovascular Exercise Tolerance: Good hypertension, (-) angina(-) CAD, (-) Past MI, (-) Cardiac Stents and (-) CABG (-) dysrhythmias (-) Valvular Problems/Murmurs     Neuro/Psych PSYCHIATRIC DISORDERS (Depression and anxiety) negative neurological ROS     GI/Hepatic negative GI ROS, Neg liver ROS,   Endo/Other  diabetes (borderline)  Renal/GU negative Renal ROS  negative genitourinary   Musculoskeletal   Abdominal   Peds  Hematology  (+) Blood dyscrasia, anemia ,   Anesthesia Other Findings Past Medical History: No date: Allergy No date: Anemia     Comment: history of No date: Anxiety No date: Asthma No date: Diabetes mellitus without complication [facility])     Comment: was told she was pre-diabetic 08/14/2016: Fibroids     Comment: [place] March 2018 08/08/2016: History of Helicobacter pylori infection     Comment: 2004 08/14/2016: Hydrosalpinx     Comment: right No date: Hypertension   Reproductive/Obstetrics negative OB ROS                             Anesthesia Physical Anesthesia Plan  ASA: II  Anesthesia Plan: General   Post-op Pain Management:    Induction:   Airway Management Planned:   Additional Equipment:   Intra-op Plan:   Post-operative Plan:   Informed Consent: I have reviewed the patients History and Physical, chart, labs and discussed the procedure  including the risks, benefits and alternatives for the proposed anesthesia with the patient or authorized representative who has indicated his/her understanding and acceptance.   Dental Advisory Given  Plan Discussed with: Anesthesiologist, CRNA and Surgeon  Anesthesia Plan Comments:         Anesthesia Quick Evaluation

## 2016-09-29 NOTE — Transfer of Care (Signed)
Immediate Anesthesia Transfer of Care Note  Patient: Alisha Kim  Procedure(s) Performed: Procedure(s): HYSTERECTOMY VAGINAL WITH BILATERAL SALPINGECTOMY (Bilateral)  Patient Location: PACU  Anesthesia Type:General  Level of Consciousness: sedated and responds to stimulation  Airway & Oxygen Therapy: Patient Spontanous Breathing and Patient connected to face mask oxygen  Post-op Assessment: Report given to RN and Post -op Vital signs reviewed and stable  Post vital signs: Reviewed and stable  Last Vitals:  Vitals:   09/29/16 0924 09/29/16 1146  BP:  132/79  Pulse:  81  Resp:  18  Temp: 36.7 C     Last Pain:  Vitals:   09/29/16 0924  TempSrc: Tympanic         Complications: No apparent anesthesia complications

## 2016-09-29 NOTE — OR Nursing (Signed)
K+ results called to Dr Rosey Bath, no new orders at this time.

## 2016-09-29 NOTE — H&P (Signed)
UPDATE TO PREVIOUS HISTORY AND PHYSICAL  The patient has been seen and examined.  H&P is up to date, no changes noted. Alisha Kim is a 46 y.o. G58P1001 female scheduled for [facility] with bilateral salpingectomy. Indications for procedure are fibroid uterus, abnormal uterine bleeding, endometrial polyp, dysmenorrhea, pelvic pain and right hydrosalpinx.  Patient can proceed to the OR for scheduled procedure.   [name], MD 09/29/2016 9:09 AM

## 2016-09-30 ENCOUNTER — Encounter: Payer: Self-pay | Admitting: Obstetrics and Gynecology

## 2016-09-30 DIAGNOSIS — D259 Leiomyoma of uterus, unspecified: Principal | ICD-10-CM

## 2016-09-30 DIAGNOSIS — N840 Polyp of corpus uteri: Secondary | ICD-10-CM

## 2016-09-30 LAB — CBC
HCT: 33.4 % — ABNORMAL LOW (ref 35.0–47.0)
HEMOGLOBIN: 11.1 g/dL — AB (ref 12.0–16.0)
MCH: 25.9 pg — ABNORMAL LOW (ref 26.0–34.0)
MCHC: 33.2 g/dL (ref 32.0–36.0)
MCV: 78 fL — ABNORMAL LOW (ref 80.0–100.0)
PLATELETS: 212 10*3/uL (ref 150–440)
RBC: 4.28 MIL/uL (ref 3.80–5.20)
RDW: 15.9 % — ABNORMAL HIGH (ref 11.5–14.5)
WBC: 8.3 10*3/uL (ref 3.6–11.0)

## 2016-09-30 LAB — SURGICAL PATHOLOGY

## 2016-09-30 MED ORDER — OXYCODONE-ACETAMINOPHEN 5-325 MG PO TABS: 1.0000 | ORAL_TABLET | Freq: Four times a day (QID) | ORAL | 0 refills | Status: DC | PRN

## 2016-09-30 MED ORDER — IBUPROFEN 600 MG PO TABS: 600.0000 mg | ORAL_TABLET | Freq: Four times a day (QID) | ORAL | 1 refills | Status: DC | PRN

## 2016-09-30 MED ORDER — DOCUSATE SODIUM 100 MG PO CAPS: 100.0000 mg | ORAL_CAPSULE | Freq: Two times a day (BID) | ORAL | 2 refills | Status: DC | PRN

## 2016-09-30 NOTE — Progress Notes (Signed)
Post-Operative Day # 1, s/p TVH with bilateral salpingectomy.  Subjective: no complaints, up ad lib, tolerating PO and + flatus.  Has not yet voided without catheter.   Objective: Temp:  [97.5 F (36.4 C)-98.7 F (37.1 C)] 98.2 F (36.8 C) (04/24 0315) Pulse Rate:  [70-103] 85 (04/24 0315) Resp:  [11-18] 18 (04/24 0315) BP: (112-148)/(61-89) 112/61 (04/24 0315) SpO2:  [98 %-100 %] 99 % (04/24 0315) Weight:  [220 lb (99.8 kg)-222 lb (100.7 kg)] 222 lb (100.7 kg) (04/23 1542)  Physical Exam:  General: alert and no distress  Lungs: clear to auscultation bilaterally Heart: regular rate and rhythm, S1, S2 normal, no murmur, click, rub or gallop Abdomen: soft, non-tender.  Bowel sounds present Pelvis: Lochia: appropriate, Extremities: DVT Evaluation: No evidence of DVT seen on physical exam. Negative Homan's sign.  No cords or calf ten  Recent Labs (last 2 labs)    Recent Labs  09/29/16 0834 09/30/16 0623  HGB 12.2 11.1*  HCT 36.0 33.4*       Recent Labs       Lab Results  Component Value Date   CREATININE 0.59 09/26/2016       Assessment/Plan: Doing well post-operatively.  Continue routine post-op care Encourage ambulation Regular diet as tolerated.     [name], MD [facility]

## 2016-09-30 NOTE — Discharge Instructions (Signed)

## 2016-09-30 NOTE — Discharge Summary (Signed)
Gynecology Physician Postoperative Discharge Summary  Patient ID: Alisha Kim MRN: [id number] DOB/AGE: [birth year] 46 y.o.  Admit Date: 09/29/2016 Discharge Date: 09/30/2016  Preoperative Diagnoses: AUB, UTERINE LEIOMYOMA, DYSMENORRHEA, PELVIC PAIN  Procedures: Procedure(s) (LRB): HYSTERECTOMY VAGINAL WITH BILATERAL SALPINGECTOMY (Bilateral)  Hospital Course:  Alisha Kim is a 46 y.o. G1P1001  admitted for scheduled surgery.  She underwent the procedures as mentioned above, her operation was uncomplicated. For further details about surgery, please refer to the operative report. Patient had an uncomplicated postoperative course. By time of discharge on POD#1, her pain was controlled on oral pain medications; she was ambulating, voiding without difficulty, tolerating regular diet and passing flatus. She was deemed stable for discharge to home.   Significant Labs: CBC Latest Ref Rng & Units 09/30/2016 09/29/2016 09/26/2016  WBC 3.6 - 11.0 K/uL 8.3 - 4.7  Hemoglobin 12.0 - 16.0 g/dL 11.1(L) 12.2 12.5  Hematocrit 35.0 - 47.0 % 33.4(L) 36.0 38.7  Platelets 150 - 440 K/uL 212 - 232    Discharge Exam: Blood pressure 112/61, pulse 85, temperature 98.2 F (36.8 C), temperature source Oral, resp. rate 18, height 5\' 6"  (1.676 m), weight 222 lb (100.7 kg), last menstrual period 09/07/2016, SpO2 99 %. General appearance: alert and no distress  Resp: clear to auscultation bilaterally  Cardio: regular rate and rhythm  GI: soft, non-tender; bowel sounds normal; no masses, no organomegaly.  Incision: C/D/I, no erythema, no drainage noted Pelvic: scant blood on pad  Extremities: extremities normal, atraumatic, no cyanosis or edema and Homans sign is negative, no sign of DVT  Discharged Condition: Stable  Disposition: 01-Home or Self Care   Allergies as of 09/30/2016      Reactions   Meloxicam Other (See Comments)   Felt horrible, headache, bloating, back pain, SHOB; no rash   Zithromax  [azithromycin] Swelling   Tongue    Pseudoephedrine-guaifenesin Swelling   Of tongue   Shellfish Allergy Rash      Medication List    STOP taking these medications   naproxen sodium 220 MG tablet Commonly known as:  ANAPROX     TAKE these medications   albuterol 108 (90 Base) MCG/ACT inhaler Commonly known as:  PROAIR HFA Inhale 1-2 puffs into the lungs every 6 (six) hours as needed for wheezing or shortness of breath.   amLODipine 5 MG tablet Commonly known as:  NORVASC Take 1 tablet (5 mg total) by mouth daily. What changed:  when to take this   diphenhydrAMINE 25 mg capsule Commonly known as:  BENADRYL Take 25 mg by mouth as needed for itching or allergies.   docusate sodium 100 MG capsule Commonly known as:  COLACE Take 1 capsule (100 mg total) by mouth 2 (two) times daily as needed for mild constipation.   doxycycline 100 MG capsule Commonly known as:  VIBRAMYCIN Take 1 capsule (100 mg total) by mouth 2 (two) times daily.   Fluticasone-Salmeterol 250-50 MCG/DOSE Aepb Commonly known as:  ADVAIR DISKUS Inhale 1 puff into the lungs 2 (two) times daily.   hydrOXYzine 25 MG tablet Commonly known as:  ATARAX/VISTARIL Take 25 mg by mouth 2 (two) times daily as needed for itching.   ibuprofen 600 MG tablet Commonly known as:  ADVIL,MOTRIN Take 1 tablet (600 mg total) by mouth every 6 (six) hours as needed (mild pain). What changed:  medication strength  how much to take  when to take this  reasons to take this   oxyCODONE-acetaminophen 5-325 MG tablet Commonly known as:  PERCOCET/ROXICET Take 1-2 tablets by mouth every 6 (six) hours as needed for severe pain (moderate to severe pain (when tolerating fluids)).   SALONPAS ARTHRITIS PAIN RELIEF EX Apply 1 patch topically daily as needed (pain).        Signed:  [name], MD [facility]

## 2016-09-30 NOTE — Progress Notes (Signed)
Patient discharged home with spouse. Discharge instructions, prescriptions and follow up appointment given to and reviewed with patient and spouse. Patient verbalized understanding. Escorted out via wheelchair by auxiliary.  

## 2016-10-06 ENCOUNTER — Encounter: Payer: Self-pay | Admitting: Obstetrics and Gynecology

## 2016-10-09 ENCOUNTER — Encounter: Payer: Self-pay | Admitting: Obstetrics and Gynecology

## 2016-10-09 ENCOUNTER — Ambulatory Visit (INDEPENDENT_AMBULATORY_CARE_PROVIDER_SITE_OTHER): Payer: 59 | Admitting: Obstetrics and Gynecology

## 2016-10-09 VITALS — BP 115/75 | HR 86 | Ht 66.0 in | Wt 218.8 lb

## 2016-10-09 DIAGNOSIS — Z90710 Acquired absence of both cervix and uterus: Secondary | ICD-10-CM

## 2016-10-09 DIAGNOSIS — R509 Fever, unspecified: Secondary | ICD-10-CM

## 2016-10-09 DIAGNOSIS — Z9889 Other specified postprocedural states: Secondary | ICD-10-CM

## 2016-10-09 DIAGNOSIS — F419 Anxiety disorder, unspecified: Secondary | ICD-10-CM

## 2016-10-09 MED ORDER — METRONIDAZOLE 500 MG PO TABS: 500.0000 mg | ORAL_TABLET | Freq: Two times a day (BID) | ORAL | 0 refills | Status: DC

## 2016-10-09 NOTE — Patient Instructions (Addendum)
  Preventing Constipation After Surgery Constipation is when a person has fewer than 3 bowel movements a week; has difficulty having a bowel movement; or has stools that are dry, hard, or larger than normal. Many things can make constipation likely after surgery. They include:  Medicines, especially numbing medicines (anesthetics) and very strong pain medicines called narcotics.  Feeling stressed because of the surgery.  Eating different foods than normal.  Being less active. Symptoms of constipation include:  Having fewer than 3 bowel movements a week.  Straining to have a bowel movement.  Having hard, dry, or larger-than-normal stools.  Feeling full or bloated.  Having pain in the lower abdomen.  Not feeling relief after having a bowel movement. Follow these instructions at home: Diet   Eat foods that have a lot of fiber. These include fruits, vegetables, whole grains, and beans. Limit foods high in fat and processed sugars. These include french fries, hamburgers, cookies, and candy.  Take a fiber supplement as directed. If you are not taking a fiber supplement and think that you are not getting enough fiber from foods, talk to your health care provider about adding a fiber supplement to your diet.  Drink clear fluids, especially water. Avoid drinking alcohol, caffeine, and soda. These can make constipation worse.  Drink enough fluids to keep your urine clear or pale yellow. Activity   After surgery, return to your normal activities slowly or when your health care provider says it is okay.  Start walking as soon as you can. Try to go a little farther each day.  Once your health care provider approves, do some sort of regular exercise. This helps prevent constipation. Bowel Movements   Go to the restroom when you have the urge to go. Do not hold it in.  Try drinking something hot to get a bowel movement started.  Keep track of how often you use the restroom. If you miss  2-3 bowel movements, talk to your health care provider about medicines that prevent constipation. Your health care provider may suggest a stool softener, laxative, or fiber supplement.  Only take over-the-counter or prescription medicines as directed by your health care provider.  Do not take other medicines without talking to your health care provider first. If you become constipated and take a medicine to make you have a bowel movement, the problem may get worse. Other kinds of medicine can also make the problem worse. Contact a health care provider if:  You used stool softeners or laxatives and still have not had a bowel movement within 24-48 hours after using them.  You have not had a bowel movement in 3 days. Get help right away if:  Your constipation lasts for more than 4 days or gets worse.  You have bright red blood in your stool.  You have abdominal or rectal pain.  You have very bad cramping.  You have thin, pencil-like stools.  You have unexplained weight loss.  You have a fever or persistent symptoms for more than 2-3 days.  You have a fever and your symptoms suddenly get worse. This information is not intended to replace advice given to you by your health care provider. Make sure you discuss any questions you have with your health care provider. Document Released: 09/20/2012 Document Revised: 11/01/2015 Document Reviewed: 07/09/2012 Elsevier Interactive Patient Education  2017 [employer].

## 2016-10-09 NOTE — Progress Notes (Signed)
    OBSTETRICS/GYNECOLOGY POST-OPERATIVE CLINIC VISIT  Subjective:     Alisha Kim is a 46 y.o. female who presents to the clinic 10 days status post vaginal hysterectomy with bilateral salpingectomy for abnormal uterine bleeding, fibroids, pelvic pain and dysmenorrhea. Eating a regular diet without difficulty. Bowel movements are normal. The patient is not having any pain.  The following portions of the patient's history were reviewed and updated as appropriate: allergies, current medications, past family history, past medical history, past social history, past surgical history and problem list.  Review of Systems A comprehensive review of systems was negative except for: Constitutional: positive for fevers Behavioral/Psych: positive for anxiety.   - Patient reports persistent daily low grade fevers since last Thursday, ranging from 99.7 - 100.5.  Takes Ibuprofen which resolves temps. Denies foul smelling discharge, urinary complaints. Is using incentive spirometer, denies leg pain or swelling.  - Patient notes that she suffered from anxiety attacks in the past, weaned herself off meds ~ 2 years ago, but had an anxiety attack last week.  Notes that she is afraid of having to be on medications again for her anxiety.    Objective:    BP 115/75 (BP Location: Left Arm, Patient Position: Sitting, Cuff Size: Large)   Pulse 86   Ht 5\' 6"  (1.676 m)   Wt 218 lb 12.8 oz (99.2 kg)   LMP 09/29/2016   BMI 35.32 kg/m  General:  alert and no distress  Abdomen: soft, bowel sounds active, non-tender    Pathology:  DIAGNOSIS:  A. UTERUS WITH CERVIX AND BILATERAL FALLOPIAN TUBES; HYSTERECTOMY WITH  BILATERAL SALPINGECTOMY:  - NABOTHIAN CYSTS OF THE CERVIX.  - PROLIFERATIVE ENDOMETRIUM.  - ADENOMYOSIS.  - LEIOMYOMATA, UP TO 1.6 CM; WITHOUT ATYPIA, NECROSIS OR INCREASED  MITOSES.  - BILATERAL FALLOPIAN TUBES WITHOUT PATHOLOGIC CHANGE.    Assessment:    Doing well postoperatively. S/p TVH  with bilateral salpingectomy Low grade fevers Anxiety   Plan:   1. Continue any current medications.  Will prescribe Flagyl for low grade fevers.  2. Wound care discussed.  3. Operative findings again reviewed. Pathology report discussed. 4. Activity restrictions: no bending, stooping, or squatting, no lifting more than 10-15 pounds and pelvic rest x 5 weeks 5. Anticipated return to work: 5 weeks. 6. Discussed anxiety symptoms. Patient notes trigger was having a "set back" day last week.  Had been feeling fine until she had to drive her mom to her appointment.  Notes the next day she was hurting more than usual.  Patient concerned that her anxiety attacks will return.  Discussed that anxiety is something that is managed, not cured.  Discussed possible need for resumption of medications vs referral to a therapist if future episodes occur.   7. Follow up: 5 weeks for final post-operative check    [name], MD [facility]

## 2016-10-10 ENCOUNTER — Encounter: Payer: Self-pay | Admitting: Obstetrics and Gynecology

## 2016-10-14 ENCOUNTER — Encounter: Payer: Self-pay | Admitting: Obstetrics and Gynecology

## 2016-10-23 ENCOUNTER — Encounter: Payer: Self-pay | Admitting: Obstetrics and Gynecology

## 2016-10-27 ENCOUNTER — Telehealth: Payer: Self-pay | Admitting: Obstetrics and Gynecology

## 2016-10-27 NOTE — Telephone Encounter (Signed)
Patient called asking if she should come in for an appointment (post op/ Hysterectomy), The patient informed me that she is not in any pain or discomfort, and would like a call back from a nurse to discuss moving forward, and going back to work in two weeks. Please advise.

## 2016-10-27 NOTE — Telephone Encounter (Signed)
Called pt no answer. Lm for pt informing her that she does need to keep post op appt.

## 2016-11-04 ENCOUNTER — Encounter: Payer: Self-pay | Admitting: Obstetrics and Gynecology

## 2016-11-04 ENCOUNTER — Ambulatory Visit (INDEPENDENT_AMBULATORY_CARE_PROVIDER_SITE_OTHER): Payer: 59 | Admitting: Obstetrics and Gynecology

## 2016-11-04 VITALS — BP 115/74 | HR 90 | Ht 66.0 in | Wt 224.2 lb

## 2016-11-04 DIAGNOSIS — M25511 Pain in right shoulder: Secondary | ICD-10-CM

## 2016-11-04 DIAGNOSIS — Z9889 Other specified postprocedural states: Secondary | ICD-10-CM

## 2016-11-04 DIAGNOSIS — Z90710 Acquired absence of both cervix and uterus: Secondary | ICD-10-CM

## 2016-11-04 MED ORDER — ALPRAZOLAM 0.5 MG PO TABS: 0.5000 mg | ORAL_TABLET | Freq: Every evening | ORAL | 3 refills | Status: DC | PRN

## 2016-11-04 NOTE — Progress Notes (Signed)
    OBSTETRICS/GYNECOLOGY POST-OPERATIVE CLINIC VISIT  Subjective:     Alisha Kim is a 46 y.o. female who presents to the clinic 6 weeks status post vaginal hysterectomy with bilateral salpingectomy for abnormal uterine bleeding, fibroids, pelvic pain and dysmenorrhea. Eating a regular diet without difficulty. Bowel movements are normal. The patient is not having any pain.  The following portions of the patient's history were reviewed and updated as appropriate: allergies, current medications, past family history, past medical history, past social history, past surgical history and problem list.  Review of Systems A comprehensive review of systems was negative except for: Musculoskeletal: positive for right shoulder pain.  Thinks it may be due to arthritis. Denies numbness/tingling, recent trauma or heavy lifting. Behavioral/Psych: positive for anxiety (still noting several anxiety attacks per week).  .    Objective:    BP 115/74 (BP Location: Left Arm, Patient Position: Sitting, Cuff Size: Normal)   Pulse 90   Ht 5\' 6"  (1.676 m)   Wt 224 lb 3.2 oz (101.7 kg)   LMP 09/29/2016   BMI 36.19 kg/m  General:  alert and no distress  Abdomen: soft, bowel sounds active, non-tender  Pelvis:  External genitalia normal, no lesions. Vaginal cuff well-healed, no erythema.  Extremities: Full range of motion in both arms, non-tender, no swelling.     Pathology:  DIAGNOSIS:  A. UTERUS WITH CERVIX AND BILATERAL FALLOPIAN TUBES; HYSTERECTOMY WITH  BILATERAL SALPINGECTOMY:  - NABOTHIAN CYSTS OF THE CERVIX.  - PROLIFERATIVE ENDOMETRIUM.  - ADENOMYOSIS.  - LEIOMYOMATA, UP TO 1.6 CM; WITHOUT ATYPIA, NECROSIS OR INCREASED  MITOSES.  - BILATERAL FALLOPIAN TUBES WITHOUT PATHOLOGIC CHANGE.      Labs:  Lab Results  Component Value Date   WBC 8.3 09/30/2016   HGB 11.1 (L) 09/30/2016   HCT 33.4 (L) 09/30/2016   MCV 78.0 (L) 09/30/2016   PLT 212 09/30/2016    Assessment:    Doing well  postoperatively. S/p TVH with bilateral salpingectomy Anxiety Right shoulder pain  Plan:   1. Activity restrictions: none.  Patient can resume all normal activities.  2. 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 3. Discussed anxiety symptoms.  Patient now ready to resume medication.  Was previously on Xanax (cannot recall exact dose but remembers it was low).  Will prescribe 0.5 mg, and patient can break in half if too strong.  Declines referral to therapy for now.  4. Advised on taking Tylenol Arthritis or other NSAID comparable product as patient thinks it may be due to arthritic pain.  To follow up with PCP if unresolved.  5. Follow up: 9-12 months for annual exam, with PCP or with [facility].     [name], MD [facility]

## 2016-12-25 ENCOUNTER — Ambulatory Visit
Admission: EM | Admit: 2016-12-25 | Discharge: 2016-12-25 | Disposition: A | Discharge status: Home / Self Care | Payer: 59 | Attending: Family Medicine | Admitting: Family Medicine

## 2016-12-25 ENCOUNTER — Encounter: Payer: Self-pay | Admitting: Gynecology

## 2016-12-25 DIAGNOSIS — M70921 Unspecified soft tissue disorder related to use, overuse and pressure, right upper arm: Secondary | ICD-10-CM | POA: Diagnosis not present

## 2016-12-25 DIAGNOSIS — T148XXA Other injury of unspecified body region, initial encounter: Secondary | ICD-10-CM

## 2016-12-25 DIAGNOSIS — M70922 Unspecified soft tissue disorder related to use, overuse and pressure, left upper arm: Secondary | ICD-10-CM

## 2016-12-25 DIAGNOSIS — R202 Paresthesia of skin: Secondary | ICD-10-CM

## 2016-12-25 DIAGNOSIS — X503XXA Overexertion from repetitive movements, initial encounter: Secondary | ICD-10-CM

## 2016-12-25 MED ORDER — NAPROXEN 500 MG PO TABS: 500.0000 mg | ORAL_TABLET | Freq: Two times a day (BID) | ORAL | 0 refills | Status: DC

## 2016-12-25 NOTE — ED Triage Notes (Signed)
Per patient numbness in bilateral arms x 1 week. Per patient numbness / tingling in fingers. Patient deny chest pain / nausea or dizziness.

## 2016-12-25 NOTE — ED Provider Notes (Signed)
CSN: [id number]     Arrival date & time 12/25/16  1800 History   First MD Initiated Contact with Patient 12/25/16 1941     Chief Complaint  Patient presents with  . numbness in bilateral aram   (Consider location/radiation/quality/duration/timing/severity/associated sxs/prior Treatment) HPI  This a 46 year old female who presents for shoulders and numbness in both her arms that she's had for over a week. She had numbness and tingling into her fingers over mostly over the dorsum of her fingers. States that she recently returned to work following her post hysterectomy convalescence. She is working on a laptop for long periods of time. She has not in the time to set up her computer properly for proper ergonomics. She does not have a wrist rest or a mouse rest and tends to sit at the computer in the same position all day long. She's had no injury to her shoulders or to her neck. He has good range of motion of her neck without discomfort at the extremes. She states that most of her pain is very severe at nighttime and tends to be in her left arm and shoulder more than the right. He has tried using a pain patch as well as a heating pad and after the heat has been on for a while the pain is subsiding. Had no clumsiness or loss of function of her upper extremities. She has not been clumsy.         Past Medical History:  Diagnosis Date  . Adenomyosis 09/2015  . Allergy   . Anemia    history of  . Anxiety   . Asthma   . Diabetes mellitus without complication ([place])    was told she was pre-diabetic  . Fibroids 08/14/2016   [place] March 2018  . History of Helicobacter pylori infection 08/08/2016   2004  . 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 Hypertension    Past Surgical History:  Procedure Laterality Date  . DENTAL SURGERY    . NO PAST SURGERIES    . TONSILLECTOMY    . VAGINAL HYSTERECTOMY Bilateral 09/29/2016   Procedure: HYSTERECTOMY VAGINAL WITH BILATERAL SALPINGECTOMY;  Surgeon: [name],  MD;  Location: [facility] ORS;  Service: Gynecology;  Laterality: Bilateral;   Family History  Problem Relation Age of Onset  . Alcohol abuse Mother   . Arthritis Mother   . Asthma Mother   . Depression Mother   . Drug abuse Mother   . Hypertension Mother   . Mental illness Mother   . Cancer Mother        breast and lung  . Alcohol abuse Father   . Drug abuse Father   . Cancer Father        lung  . Hypertension Brother   . Hearing loss Maternal Aunt   . Hypertension Maternal Aunt   . Stroke Maternal Aunt   . Alzheimer's disease Maternal Aunt   . Cancer Maternal Grandmother        cervical  . Heart disease Maternal Grandmother   . Hypertension Maternal Grandmother    Social History  Substance Use Topics  . Smoking status: Never Smoker  . Smokeless tobacco: Never Used  . Alcohol use 0.0 oz/week     Comment: seldom use   OB History    Gravida Para Term Preterm AB Living   1 1 1     1    SAB TAB Ectopic Multiple Live Births  1     Review of Systems  Constitutional: Positive for activity change. Negative for appetite change, chills, fatigue and fever.  Musculoskeletal: Positive for arthralgias and myalgias.  Neurological: Positive for numbness.  All other systems reviewed and are negative.   Allergies  Meloxicam; Zithromax [azithromycin]; Pseudoephedrine-guaifenesin; Shellfish allergy; and Shellfish-derived products  Home Medications   Prior to Admission medications   Medication Sig Start Date End Date Taking? Authorizing Provider  albuterol (PROAIR HFA) 108 (90 Base) MCG/ACT inhaler Inhale 1-2 puffs into the lungs every 6 (six) hours as needed for wheezing or shortness of breath. 07/24/15  Yes Bobetta Lime, MD  ALPRAZolam Duanne Moron) 0.5 MG tablet Take 1 tablet (0.5 mg total) by mouth at bedtime as needed for anxiety. 11/04/16  Yes [name], MD  diphenhydrAMINE (BENADRYL) 25 mg capsule Take 25 mg by mouth as needed for itching or allergies.    Yes [provider]  docusate sodium (COLACE) 100 MG capsule Take 1 capsule (100 mg total) by mouth 2 (two) times daily as needed for mild constipation. 09/30/16  Yes [name], MD  hydrOXYzine (ATARAX/VISTARIL) 25 MG tablet Take 25 mg by mouth 2 (two) times daily as needed for itching.    Yes [provider]  Liniments (SALONPAS ARTHRITIS PAIN RELIEF EX) Apply 1 patch topically daily as needed (pain).   Yes [provider]  amLODipine (NORVASC) 5 MG tablet Take 1 tablet (5 mg total) by mouth daily. Patient taking differently: Take 5 mg by mouth every evening.  07/07/16   Arnetha Courser, MD  Fluticasone-Salmeterol (ADVAIR DISKUS) 250-50 MCG/DOSE AEPB Inhale 1 puff into the lungs 2 (two) times daily. 07/24/15   Bobetta Lime, MD  ibuprofen (ADVIL,MOTRIN) 600 MG tablet Take 1 tablet (600 mg total) by mouth every 6 (six) hours as needed (mild pain). 09/30/16   [name], MD  naproxen (NAPROSYN) 500 MG tablet Take 1 tablet (500 mg total) by mouth 2 (two) times daily with a meal. 12/25/16   Lorin Picket, PA-C   Meds Ordered and Administered this Visit  Medications - No data to display  Ht 5\' 6"  (1.676 m)   Wt 220 lb (99.8 kg)   LMP 09/29/2016   BMI 35.51 kg/m  No data found.   Physical Exam  Constitutional: She is oriented to person, place, and time. She appears well-developed and well-nourished. No distress.  HENT:  Head: Normocephalic.  Eyes: Pupils are equal, round, and reactive to light.  Neck: Normal range of motion. Neck supple.  Musculoskeletal: Normal range of motion. She exhibits no tenderness.  Has good range of motion of her cervical spine shoulders elbows wrists and fingers. She has no paraspinous muscle tenderness. There is mild tenderness in the trapezii bilaterally. There is no subacromial tenderness. She has a negative empty can test and a negative arm drop test. Upper extremity strength is intact. Partially sensation is intact. DTRs are 2+ over 4 and  symmetrical. He does have discomfort with abduction of her shoulders but the tendon but the pain is diffuse.  Neurological: She is alert and oriented to person, place, and time. She displays normal reflexes. No cranial nerve deficit or sensory deficit. She exhibits normal muscle tone. Coordination normal.  Skin: Skin is warm and dry. She is not diaphoretic.  Psychiatric: She has a normal mood and affect. Her behavior is normal. Judgment and thought content normal.  Nursing note and vitals reviewed.   Urgent Care Course     Procedures (including  critical care time)  Labs Review Labs Reviewed - No data to display  Imaging Review No results found.   Visual Acuity Review  Right Eye Distance:   Left Eye Distance:   Bilateral Distance:    Right Eye Near:   Left Eye Near:    Bilateral Near:         MDM   1. Overuse syndrome    Discharge Medication List as of 12/25/2016  8:11 PM    START taking these medications   Details  naproxen (NAPROSYN) 500 MG tablet Take 1 tablet (500 mg total) by mouth 2 (two) times daily with a meal., Starting Thu 12/25/2016, Normal      Plan: 1. Test/x-ray results and diagnosis reviewed with patient 2. rx as per orders; risks, benefits, potential side effects reviewed with patient 3. Recommend supportive treatment with Use of Naprosyn. Patient has an allergy to Mobic but is able to take ibuprofen and I would imagine able to take  other Cox 1 inhibitors as well. Her symptoms seem to be associated with her working at home. I recommended that she research ergonomics with computer use and adjust her computer accordingly. She should also use a wrist roll and a roll for her mouse hand as well. If she is not improving she should follow-up with her primary care physician for a more in-depth evaluation. 4. F/u prn if symptoms worsen or don't improve     Lorin Picket, PA-C 12/25/16 2022

## 2017-01-07 DIAGNOSIS — R079 Chest pain, unspecified: Secondary | ICD-10-CM | POA: Diagnosis not present

## 2017-01-26 ENCOUNTER — Ambulatory Visit
Admission: EM | Admit: 2017-01-26 | Discharge: 2017-01-26 | Disposition: A | Discharge status: Home / Self Care | Payer: 59 | Attending: Family Medicine | Admitting: Family Medicine

## 2017-01-26 ENCOUNTER — Ambulatory Visit (INDEPENDENT_AMBULATORY_CARE_PROVIDER_SITE_OTHER): Payer: 59

## 2017-01-26 ENCOUNTER — Encounter: Payer: Self-pay | Admitting: Emergency Medicine

## 2017-01-26 DIAGNOSIS — J069 Acute upper respiratory infection, unspecified: Secondary | ICD-10-CM

## 2017-01-26 DIAGNOSIS — R079 Chest pain, unspecified: Secondary | ICD-10-CM | POA: Insufficient documentation

## 2017-01-26 DIAGNOSIS — M5410 Radiculopathy, site unspecified: Secondary | ICD-10-CM | POA: Diagnosis not present

## 2017-01-26 DIAGNOSIS — R202 Paresthesia of skin: Secondary | ICD-10-CM

## 2017-01-26 DIAGNOSIS — M47812 Spondylosis without myelopathy or radiculopathy, cervical region: Secondary | ICD-10-CM | POA: Diagnosis not present

## 2017-01-26 DIAGNOSIS — M4692 Unspecified inflammatory spondylopathy, cervical region: Secondary | ICD-10-CM | POA: Diagnosis not present

## 2017-01-26 DIAGNOSIS — R0989 Other specified symptoms and signs involving the circulatory and respiratory systems: Secondary | ICD-10-CM | POA: Diagnosis not present

## 2017-01-26 DIAGNOSIS — R0789 Other chest pain: Secondary | ICD-10-CM

## 2017-01-26 MED ORDER — KETOROLAC TROMETHAMINE 60 MG/2ML IM SOLN
60.0000 mg | Freq: Once | INTRAMUSCULAR | Status: DC
Start: 2017-01-26 — End: 2017-01-26

## 2017-01-26 MED ORDER — CYCLOBENZAPRINE HCL 10 MG PO TABS: 10.0000 mg | ORAL_TABLET | Freq: Every day | ORAL | 0 refills | Status: DC

## 2017-01-26 MED ORDER — TRAMADOL HCL 50 MG PO TABS: ORAL_TABLET | ORAL | 0 refills | Status: DC

## 2017-01-26 NOTE — ED Provider Notes (Signed)
[facility]    CSN: [id number] Arrival date & time: 01/26/17  1230     History   Chief Complaint Chief Complaint  Patient presents with  . Arm Pain    right arm  . Numbness    left arm  . Chest Pain    HPI Alisha Kim is a 46 y.o. female.   46 yo female with a c/o 3-4 weeks of intermittent right arm shooting pain, neck discomfort and intermittent left hand numbness. Also complains of 2 days of nasal congestion, runny nose, mild cough, nausea and chest discomfort today. States she has a h/o asthma. Denies wheezing.    The history is provided by the patient.  Arm Pain  Associated symptoms include chest pain.  Chest Pain    Past Medical History:  Diagnosis Date  . Adenomyosis 09/2015  . Allergy   . Anemia    history of  . Anxiety   . Asthma   . Diabetes mellitus without complication ([place])    was told she was pre-diabetic  . Fibroids 08/14/2016   [place] March 2018  . History of Helicobacter pylori infection 08/08/2016   2004  . 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 Hypertension     Patient Active Problem List   Diagnosis Date Noted  . S/P vaginal hysterectomy 09/29/2016  . Fibroids 08/14/2016  . History of Helicobacter pylori infection 08/08/2016  . Microcytosis 08/01/2016  . Heel spur, left 04/07/2016  . Bilateral foot pain 04/04/2016  . Obesity 04/04/2016  . Anxiety 04/04/2016  . Hypertension, goal below 140/90 07/24/2015  . Cough 07/24/2015  . Bilateral hip pain 11/17/2014  . Atopic dermatitis 11/17/2014  . Low serum vitamin D 11/17/2014  . Pre-diabetes 11/17/2014  . Depression with anxiety 11/17/2014  . Mild persistent asthma with allergic rhinitis without complication 29/93/7169    Past Surgical History:  Procedure Laterality Date  . ABDOMINAL HYSTERECTOMY    . DENTAL SURGERY    . NO PAST SURGERIES    . TONSILLECTOMY    . VAGINAL HYSTERECTOMY Bilateral 09/29/2016   Procedure: HYSTERECTOMY VAGINAL WITH BILATERAL SALPINGECTOMY;  Surgeon:  [name], MD;  Location: [facility] ORS;  Service: Gynecology;  Laterality: Bilateral;    OB History    Gravida Para Term Preterm AB Living   1 1 1     1    SAB TAB Ectopic Multiple Live Births           1       Home Medications    Prior to Admission medications   Medication Sig Start Date End Date Taking? Authorizing Provider  albuterol (PROAIR HFA) 108 (90 Base) MCG/ACT inhaler Inhale 1-2 puffs into the lungs every 6 (six) hours as needed for wheezing or shortness of breath. 07/24/15   Bobetta Lime, MD  amLODipine (NORVASC) 5 MG tablet Take 1 tablet (5 mg total) by mouth daily. Patient taking differently: Take 5 mg by mouth every evening.  07/07/16   Arnetha Courser, MD  cyclobenzaprine (FLEXERIL) 10 MG tablet Take 1 tablet (10 mg total) by mouth at bedtime. 01/26/17   Norval Gable, MD  diphenhydrAMINE (BENADRYL) 25 mg capsule Take 25 mg by mouth as needed for itching or allergies.     [provider]  docusate sodium (COLACE) 100 MG capsule Take 1 capsule (100 mg total) by mouth 2 (two) times daily as needed for mild constipation. 09/30/16   [name], MD  Fluticasone-Salmeterol (ADVAIR DISKUS) 250-50 MCG/DOSE AEPB Inhale 1  puff into the lungs 2 (two) times daily. 07/24/15   Bobetta Lime, MD  hydrOXYzine (ATARAX/VISTARIL) 25 MG tablet Take 25 mg by mouth 2 (two) times daily as needed for itching.     [provider]  ibuprofen (ADVIL,MOTRIN) 600 MG tablet Take 1 tablet (600 mg total) by mouth every 6 (six) hours as needed (mild pain). 09/30/16   [name], MD  [facility] ARTHRITIS PAIN RELIEF EX) Apply 1 patch topically daily as needed (pain).    [provider]  traMADol Veatrice Bourbon) 50 MG tablet 1-2 tabs po q 8 hours prn 01/26/17   Norval Gable, MD    Family History Family History  Problem Relation Age of Onset  . Alcohol abuse Mother   . Arthritis Mother   . Asthma Mother   . Depression Mother   . Drug abuse Mother   . Hypertension  Mother   . Mental illness Mother   . Cancer Mother        breast and lung  . Alcohol abuse Father   . Drug abuse Father   . Cancer Father        lung  . Hypertension Brother   . Hearing loss Maternal Aunt   . Hypertension Maternal Aunt   . Stroke Maternal Aunt   . Alzheimer's disease Maternal Aunt   . Cancer Maternal Grandmother        cervical  . Heart disease Maternal Grandmother   . Hypertension Maternal Grandmother     Social History Social History  Substance Use Topics  . Smoking status: Never Smoker  . Smokeless tobacco: Never Used  . Alcohol use 0.0 oz/week     Comment: seldom use     Allergies   Meloxicam; Zithromax [azithromycin]; Pseudoephedrine-guaifenesin; Shellfish allergy; and Shellfish-derived products   Review of Systems Review of Systems  Cardiovascular: Positive for chest pain.     Physical Exam Triage Vital Signs ED Triage Vitals  Enc Vitals Group     BP 01/26/17 1243 125/89     Pulse Rate 01/26/17 1243 79     Resp 01/26/17 1243 16     Temp 01/26/17 1243 97.8 F (36.6 C)     Temp Source 01/26/17 1243 Oral     SpO2 01/26/17 1243 100 %     Weight 01/26/17 1240 230 lb (104.3 kg)     Height 01/26/17 1240 5\' 6"  (1.676 m)     Head Circumference --      Peak Flow --      Pain Score 01/26/17 1241 7     Pain Loc --      Pain Edu? --      Excl. in [place]? --    No data found.   Updated Vital Signs BP 125/89 (BP Location: Right Arm)   Pulse 79   Temp 97.8 F (36.6 C) (Oral)   Resp 16   Ht 5\' 6"  (1.676 m)   Wt 230 lb (104.3 kg)   LMP 09/29/2016   SpO2 100%   BMI 37.12 kg/m   Visual Acuity Right Eye Distance:   Left Eye Distance:   Bilateral Distance:    Right Eye Near:   Left Eye Near:    Bilateral Near:     Physical Exam  Constitutional: She is oriented to person, place, and time. She appears well-developed and well-nourished. No distress.  HENT:  Head: Normocephalic and atraumatic.  Eyes: Pupils are equal, round, and  reactive to light. EOM are normal.  Neck:  Normal range of motion. Neck supple. No tracheal deviation present. No thyromegaly present.  Cardiovascular: Normal rate, regular rhythm, normal heart sounds and intact distal pulses.   No murmur heard. Pulmonary/Chest: Effort normal and breath sounds normal. No respiratory distress. She has no wheezes. She has no rales.  Musculoskeletal: She exhibits no edema.       Cervical back: She exhibits tenderness and spasm. She exhibits normal range of motion, no bony tenderness (over the right cervical paraspinous muscles and trapezius), no swelling, no edema, no deformity, no laceration and normal pulse.  Lymphadenopathy:    She has no cervical adenopathy.  Neurological: She is alert and oriented to person, place, and time. She has normal reflexes. No cranial nerve deficit. She exhibits normal muscle tone. Coordination normal.  Skin: She is not diaphoretic.  Nursing note and vitals reviewed.    UC Treatments / Results  Labs (all labs ordered are listed, but only abnormal results are displayed) Labs Reviewed - No data to display  EKG  EKG Interpretation None       Radiology Dg Chest 2 View  Result Date: 01/26/2017 CLINICAL DATA:  Nausea vomiting.  Congestion.  Pain. EXAM: CHEST  2 VIEW COMPARISON:  No recent prior. FINDINGS: Mediastinum and hilar structures normal. Lungs are clear. No focal infiltrate. No pleural effusion or pneumothorax. No acute bony abnormality. IMPRESSION: No acute abnormality. Electronically Signed   By: Marcello Moores  Register   On: 01/26/2017 13:41   Dg Cervical Spine Complete  Result Date: 01/26/2017 CLINICAL DATA:  Severe pain in right all arm for several weeks. EXAM: CERVICAL SPINE - COMPLETE 4+ VIEW COMPARISON:  None. FINDINGS: There is straightening of normal lordosis with no other malalignment. No fractures are seen. The pre odontoid space and prevertebral soft tissues are normal. Degenerative changes are seen most marked at  C3-4 with anterior and posterior osteophytes. The neural foramina are widely patent. The lateral masses of C1 align with C2. The odontoid process is normal. The lung apices are normal. IMPRESSION: Degenerative changes as above.  No other acute abnormalities. Electronically Signed   By: Dorise Bullion III M.D   On: 01/26/2017 13:44    Procedures ED EKG Date/Time: 01/26/2017 7:13 PM Performed by: Norval Gable Authorized by: Norval Gable   ECG reviewed by ED Physician in the absence of a cardiologist: yes   Previous ECG:    Previous ECG:  Compared to current   Similarity:  No change Interpretation:    Interpretation: normal   Rate:    ECG rate:  74   ECG rate assessment: normal   Rhythm:    Rhythm: sinus rhythm   Ectopy:    Ectopy: none   QRS:    QRS axis:  Normal Conduction:    Conduction: normal   ST segments:    ST segments:  Normal T waves:    T waves: normal     (including critical care time)  Medications Ordered in UC Medications - No data to display   Initial Impression / Assessment and Plan / UC Course  I have reviewed the triage vital signs and the nursing notes.  Pertinent labs & imaging results that were available during my care of the patient were reviewed by me and considered in my medical decision making (see chart for details).       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 x-ray results and diagnosis reviewed with patient 2. rx as per orders above; reviewed possible side effects, interactions, risks and benefits    3. Recommend supportive treatment with rest, fluids, otc cold medication 4. Follow-up prn if symptoms worsen or don't improve  Controlled Substance Prescriptions [place] Controlled Substance Registry consulted? Yes, I have consulted the [place] Controlled Substances Registry for this patient, and feel the risk/benefit ratio today is favorable for proceeding with this prescription for a controlled substance.   Norval Gable, MD 01/26/17 [phone]

## 2017-01-26 NOTE — ED Triage Notes (Signed)
Patient c/o chest pain and right arm pain and left arm numbness and tingling that started last night.  Patient reports N/V that started this morning.

## 2017-03-02 ENCOUNTER — Ambulatory Visit (INDEPENDENT_AMBULATORY_CARE_PROVIDER_SITE_OTHER): Payer: 59 | Admitting: Family Medicine

## 2017-03-02 ENCOUNTER — Encounter: Payer: Self-pay | Admitting: Family Medicine

## 2017-03-02 VITALS — BP 126/80 | HR 96 | Temp 98.1°F | Resp 14 | Wt 224.3 lb

## 2017-03-02 DIAGNOSIS — I10 Essential (primary) hypertension: Secondary | ICD-10-CM

## 2017-03-02 DIAGNOSIS — Z1281 Encounter for screening for malignant neoplasm of oral cavity: Secondary | ICD-10-CM | POA: Diagnosis not present

## 2017-03-02 DIAGNOSIS — G8929 Other chronic pain: Secondary | ICD-10-CM | POA: Diagnosis not present

## 2017-03-02 DIAGNOSIS — M5412 Radiculopathy, cervical region: Secondary | ICD-10-CM | POA: Diagnosis not present

## 2017-03-02 DIAGNOSIS — Z23 Encounter for immunization: Secondary | ICD-10-CM | POA: Diagnosis not present

## 2017-03-02 DIAGNOSIS — K05321 Chronic periodontitis, generalized, slight: Secondary | ICD-10-CM | POA: Diagnosis not present

## 2017-03-02 DIAGNOSIS — K08409 Partial loss of teeth, unspecified cause, unspecified class: Secondary | ICD-10-CM | POA: Diagnosis not present

## 2017-03-02 DIAGNOSIS — J3489 Other specified disorders of nose and nasal sinuses: Secondary | ICD-10-CM | POA: Diagnosis not present

## 2017-03-02 DIAGNOSIS — M25511 Pain in right shoulder: Secondary | ICD-10-CM | POA: Diagnosis not present

## 2017-03-02 MED ORDER — HYDROXYZINE HCL 25 MG PO TABS: 25.0000 mg | ORAL_TABLET | Freq: Three times a day (TID) | ORAL | 2 refills | Status: DC | PRN

## 2017-03-02 MED ORDER — CYCLOBENZAPRINE HCL 10 MG PO TABS: 10.0000 mg | ORAL_TABLET | Freq: Every day | ORAL | 1 refills | Status: DC

## 2017-03-02 MED ORDER — AMLODIPINE BESYLATE 5 MG PO TABS: 5.0000 mg | ORAL_TABLET | Freq: Every evening | ORAL | 3 refills | Status: DC

## 2017-03-02 NOTE — Patient Instructions (Signed)
I've put in orders for physical therapy and the MRI If you have not heard anything from my staff in a week about any orders/referrals/studies from today, please contact us here to follow-up [phone]
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
## 2017-03-03 ENCOUNTER — Telehealth: Payer: Self-pay

## 2017-03-03 NOTE — Telephone Encounter (Signed)
Patient was informed that she has been scheduled to have her MRI on Friday, March 06, 2017 at 7pm at [facility] . Patient was instructed to arrive at 6:30pm to get registered.

## 2017-03-06 ENCOUNTER — Ambulatory Visit
Admission: RE | Admit: 2017-03-06 | Discharge: 2017-03-06 | Disposition: A | Discharge status: Home / Self Care | Payer: 59 | Source: Ambulatory Visit | Attending: Family Medicine | Admitting: Family Medicine

## 2017-03-06 DIAGNOSIS — M5030 Other cervical disc degeneration, unspecified cervical region: Secondary | ICD-10-CM | POA: Diagnosis not present

## 2017-03-06 DIAGNOSIS — M5412 Radiculopathy, cervical region: Secondary | ICD-10-CM | POA: Diagnosis present

## 2017-03-06 DIAGNOSIS — M542 Cervicalgia: Secondary | ICD-10-CM | POA: Diagnosis not present

## 2017-03-06 DIAGNOSIS — M4312 Spondylolisthesis, cervical region: Secondary | ICD-10-CM | POA: Diagnosis not present

## 2017-03-06 DIAGNOSIS — M25511 Pain in right shoulder: Secondary | ICD-10-CM

## 2017-03-06 DIAGNOSIS — M5011 Cervical disc disorder with radiculopathy,  high cervical region: Secondary | ICD-10-CM | POA: Diagnosis not present

## 2017-03-06 DIAGNOSIS — M48062 Spinal stenosis, lumbar region with neurogenic claudication: Secondary | ICD-10-CM | POA: Insufficient documentation

## 2017-03-06 DIAGNOSIS — G8929 Other chronic pain: Secondary | ICD-10-CM

## 2017-03-10 ENCOUNTER — Encounter: Payer: Self-pay | Admitting: Family Medicine

## 2017-03-10 ENCOUNTER — Other Ambulatory Visit: Payer: Self-pay

## 2017-03-10 DIAGNOSIS — M4802 Spinal stenosis, cervical region: Secondary | ICD-10-CM

## 2017-03-10 DIAGNOSIS — M4310 Spondylolisthesis, site unspecified: Secondary | ICD-10-CM
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
## 2017-03-26 DIAGNOSIS — M79601 Pain in right arm: Secondary | ICD-10-CM | POA: Diagnosis not present

## 2017-03-26 DIAGNOSIS — M542 Cervicalgia: Secondary | ICD-10-CM | POA: Diagnosis not present

## 2017-04-06 ENCOUNTER — Encounter: Payer: Self-pay | Admitting: Physical Therapy

## 2017-04-06 ENCOUNTER — Ambulatory Visit: Payer: 59 | Attending: Family Medicine | Admitting: Physical Therapy

## 2017-04-06 DIAGNOSIS — M79601 Pain in right arm: Secondary | ICD-10-CM | POA: Insufficient documentation

## 2017-04-06 DIAGNOSIS — M542 Cervicalgia: Secondary | ICD-10-CM | POA: Insufficient documentation

## 2017-04-06 DIAGNOSIS — R293 Abnormal posture: Secondary | ICD-10-CM | POA: Insufficient documentation

## 2017-04-06 NOTE — Patient Instructions (Addendum)
Neurovascular: Median Nerve Glide With Cervical Bias - Supine    Lie with neck supported, right arm out to side, elbow straight, thumb down, fingers and wrist bent back. Slowly move opposite side ear toward shoulder as far as possible without pain. Repeat __10__ times per set. Do __2__ sets per session. Do _5___ sessions per week. Do at night before going to bed or in the morning when waking up;  Copyright  VHI. All rights reserved.  Neurovascular: Radial Nerve Glide With Cervical Bias - Supine    Lie with neck supported, right arm out to side, elbow straight, palm down, thumb pulled toward palm. Slowly move opposite side ear toward shoulder as far as possible without pain. Repeat _10___ times per set. Do _2___ sets per session. Do __5__ sessions per week.  Copyright  VHI. All rights reserved.   Copyright  VHI. All rights reserved.   Roll   Inhale and bring shoulders up, back, then exhale and relax shoulders down. Repeat _10__ times. Do _2-3__ times per day.  Copyright  VHI. All rights reserved.    Scapular Retraction (Standing)    With arms at sides, pinch shoulder blades together. Hold for 5 sec;  Repeat __10__ times per set. Do __2__ sets per session. Do __2__ sessions per day.  http://orth.exer.us/945   Copyright  VHI. All rights reserved.
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
## 2017-04-08 ENCOUNTER — Ambulatory Visit: Payer: 59 | Admitting: Physical Therapy

## 2017-04-08 ENCOUNTER — Encounter: Payer: Self-pay | Admitting: Physical Therapy

## 2017-04-08 DIAGNOSIS — M79601 Pain in right arm: Secondary | ICD-10-CM | POA: Diagnosis not present

## 2017-04-08 DIAGNOSIS — R293 Abnormal posture: Secondary | ICD-10-CM

## 2017-04-08 DIAGNOSIS — M542 Cervicalgia: Secondary | ICD-10-CM | POA: Diagnosis not present

## 2017-04-08 NOTE — Patient Instructions (Addendum)
  Copyright  VHI. All rights reserved.  Shoulder Retraction   Tie band around door knob (sitting or standing, holding band in both hands) Facing chest height anchor, grasp ends of band and pull hands to chest, squeezing shoulder blades together. Hold _3-5seconds. Repeat _10 times. Do _2_ sessions per day. Safety Note: Be sure anchor is secure.  Copyright  VHI. All rights reserved.  Strengthening: Resisted Extension   Hold band in both hands, Pull arm back, elbow straight, squeezing shoulder blades, Repeat _10___ times per set. Do _2___ sets per session. Do __1__ sessions per day.  http://orth.exer.us/833   Copyright  VHI. All rights reserved.   CHEST: Doorway, Bilateral - Standing    Standing in doorway, place hands on wall with elbows bent, HANDS AT SHOULDER HEIGHT. step forward until stretch is felt in front of shoulders.  Hold __15-20_ seconds. 2-3___ reps per set, _2__ sets per day, __5_ days per week  Copyright  VHI. All rights reserved.

## 2017-04-08 NOTE — Therapy (Signed)
[place] MAIN [facility] SERVICES [address] [place], [place], [address] Phone: [phone]   Fax:  [phone]  Physical Therapy Treatment  Patient Details  Name: Alisha Kim MRN: [id number] Date of Birth: February 12, 1971 Referring Provider: Enid Derry, MD;   Encounter Date: 04/08/2017      PT End of Session - 04/08/17 1755    Visit Number 2   Number of Visits 13   Date for PT Re-Evaluation 05/19/17   Authorization Type  employee   PT Start Time 8242   PT Stop Time 3536   PT Time Calculation (min) 41 min   Activity Tolerance Patient tolerated treatment well;No increased pain   Behavior During Therapy WFL for tasks assessed/performed      Past Medical History:  Diagnosis Date  . Adenomyosis 09/2015  . Allergy   . Anemia    history of  . Anxiety    controlled;   . Asthma   . Diabetes mellitus without complication ([place])    was told she was pre-diabetic  . Fibroids 08/14/2016   [place] March 2018  . History of Helicobacter pylori infection 08/08/2016   2004  . 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 Hypertension    controlled with medication;     Past Surgical History:  Procedure Laterality Date  . ABDOMINAL HYSTERECTOMY    . DENTAL SURGERY    . NO PAST SURGERIES    . TONSILLECTOMY    . VAGINAL HYSTERECTOMY Bilateral 09/29/2016   Procedure: HYSTERECTOMY VAGINAL WITH BILATERAL SALPINGECTOMY;  Surgeon: [name], MD;  Location: [facility] ORS;  Service: Gynecology;  Laterality: Bilateral;    There were no vitals filed for this visit.      Subjective Assessment - 04/08/17 1753    Subjective Patient reports, "I want to say thank you for showing me how to sleep. I was able to sleep through the night without waking up for the first time in a long time." Patient reports less pain and numbness overall;    Pertinent History 46 yo Female reports increased neck pain with RUE radiculopathy symptoms. She reports having sharp/burning pain  down RUE along C6/C7 nerve root distribution with occasional numbness. Patient reports symptoms are worse at night when trying to sleep. She reports multiple sleep disturbances. She reports being able to sleep about 3 hours at a time;  PMH significant for hysterectomy on April 23; She reports while recovering from that she started having the sharp pain; Reports that it has been intermittent which has progressed over last few months; Patient works for [employer] from home. She reports that she sits at her desk for 8 hours a day but is able to take short rest breaks in between; Patient is following up with Neurosurgeon; She has not had steroid injections at this time and prefers to try conservative treatment for this condition;    How long can you sit comfortably? 4 hours   How long can you stand comfortably? NA   How long can you walk comfortably? NA   Diagnostic tests MRI shows disc degenerative changes; mild stenosis and foraminal narrowing; no fractures;    Patient Stated Goals "not be in pain without pain meds" being able to sleep better;    Currently in Pain? Yes   Pain Score 2    Pain Location Arm   Pain Orientation Right   Pain Descriptors / Indicators Sharp;Shooting   Pain Type Chronic pain   Pain Onset More than a  month ago   Pain Frequency Intermittent   Aggravating Factors  worse at night/with movement;    Pain Relieving Factors stretches help; good positioning;    Effect of Pain on Daily Activities decreased activity tolerance;    Multiple Pain Sites No         TREATMENT Warm up on UBE BUE, level 2 forward/backward x3 min (Unbilled):  Instructed patient in advanced UE exercise for postural strengthening:   Standing doorway stretch with BUE 15 sec hold x3 reps with cues for positioning for better stretch; BUE green tband: Shoulder extension x10 with cues to keep elbows straight and to reduce shoulder elevation for better scapular control; Shoulder low row x10 with cues to  relax upper trap and increase scapular retraction;   Patient supine: BUE wand flexion 2# x10 with cues to avoid painful ROM for better shoulder flexibility; BUE wand chest press 2# x10 reps;  Educated patient on doing UE overhead lifting with elbow bent at this time to reduce discomfort as patient has more pain with elbow straight;  Patient left sidelying: RUE shoulder ER 1# 2x10 with towel roll under elbow with cues to increase ROM and improve postural control for better strengthening;    Patient supine: (manual therapy) PT performed grade II-III inferior/posterior/anterior glenohumeral joint mobs to RUE 10 sec bouts x4 each; Patient denies any pain; PT performed median/radial nerve glides x10 reps with cues for head position for better flossing; Patient reports increased pull at end range of stretch;    Patient sitting: RUE shoulder flexion concurrent with inferior joint glide with mobilization belt (mobilization with movement) 2x10 with cues to increase ROM to tolerance and improve erect posture; Patient denies any increase in pain during this exercise exhibiting better tolerance with inferior joint glide;                   PT Education - 04/08/17 1754    Education provided Yes   Education Details HEP advanced, UE exercise/stretches;    Person(s) Educated Patient   Methods Explanation;Demonstration;Verbal cues   Comprehension Verbalized understanding;Returned demonstration;Verbal cues required;Need further instruction          PT Short Term Goals - 04/07/17 1102      PT SHORT TERM GOAL #1   Title Patient will be independent and compliant with nerve glides at least 5x a week, as a way to reduce RUE symptoms for better sleep tolerance;    Time 4   Period Weeks   Status New   Target Date 05/05/17     PT SHORT TERM GOAL #2   Title Patient will be able to sleep at least 5 hours a night on average with less RUE symptoms over last week to exhibit less symptoms and  for overall improved health.    Time 4   Period Weeks   Status New   Target Date 05/05/17     PT SHORT TERM GOAL #3   Title Patient will decrease Quick DASH score by > 8 points demonstrating reduced self-reported upper extremity disability.   Time 4   Period Weeks   Status New   Target Date 05/05/17           PT Long Term Goals - 04/07/17 1104      PT LONG TERM GOAL #1   Title Patient will be independent in home exercise program to improve strength/mobility for better functional independence with ADLs.   Time 6   Period Weeks   Status New  Target Date 05/19/17     PT LONG TERM GOAL #2   Title Patient will report a worst pain of 3/10 on VAS in   RUE          to improve tolerance with ADLs and reduced symptoms with activities.    Time 6   Period Weeks   Status New   Target Date 05/19/17     PT LONG TERM GOAL #3   Title Patient will exhibit RUE shoulder AROM WFL without increase in pain in order to exhibit better tolerance with ADLs including showering, dressing, etc;    Time 6   Period Weeks   Status New   Target Date 05/19/17     PT LONG TERM GOAL #4   Title patient will reduce quick DASH score to <20 to exhibit improved tolerance with ADLs and work tasks.    Time 6   Period Weeks   Status New   Target Date 05/19/17     PT LONG TERM GOAL #5   Title patient will exhibit improved cervical AROM including: lateral flexion >30 degrees and rotation >55 degrees bilaterally to exhibit improved ROM for turning head when driving or doing other ADLs.    Time 6   Period Weeks   Status New   Target Date 05/19/17               Plan - 04/08/17 1803    Clinical Impression Statement Patient instructed in advanced postural strengthening exercise as part of HEP- see patient instructions. She required min VCs to improve scapular retraction while relaxing upper trap for better postural control. Patient denies any increase in pain. PT performed extensive stretches to RUE to  reduce nerve impingement and reduce shoulder stiffness. Patient tolerated well. She would benefit from additional skiled PT intervention to improve shoulder ROM and reduce pain with ADLs;    Rehab Potential Good   Clinical Impairments Affecting Rehab Potential chronic condition;    PT Frequency 2x / week   PT Duration 6 weeks   PT Treatment/Interventions Cryotherapy;Electrical Stimulation;Moist Heat;Traction;Ultrasound;Neuromuscular re-education;Therapeutic exercise;Therapeutic activities;Functional mobility training;Patient/family education;Manual techniques;Passive range of motion;Dry needling;Energy conservation;Taping   PT Next Visit Plan advance HEP, manual therapy   PT Home Exercise Plan advanced- see patient instructions;    Consulted and Agree with Plan of Care Patient      Patient will benefit from skilled therapeutic intervention in order to improve the following deficits and impairments:  Hypomobility, Impaired sensation, Decreased activity tolerance, Increased fascial restricitons, Impaired UE functional use, Pain, Decreased range of motion, Improper body mechanics, Postural dysfunction, Impaired flexibility  Visit Diagnosis: Cervicalgia  Pain in right arm  Abnormal posture     Problem List Patient Active Problem List   Diagnosis Date Noted  . S/P vaginal hysterectomy 09/29/2016  . Fibroids 08/14/2016  . History of Helicobacter pylori infection 08/08/2016  . Microcytosis 08/01/2016  . Heel spur, left 04/07/2016  . Bilateral foot pain 04/04/2016  . Obesity 04/04/2016  . Anxiety 04/04/2016  . Hypertension, goal below 140/90 07/24/2015  . Cough 07/24/2015  . Bilateral hip pain 11/17/2014  . Atopic dermatitis 11/17/2014  . Low serum vitamin D 11/17/2014  . Pre-diabetes 11/17/2014  . Depression with anxiety 11/17/2014  . Mild persistent asthma with allergic rhinitis without complication 67/67/2094    Trotter,Margaret PT, DPT 04/09/2017, 8:54 AM  [place] MAIN [facility] SERVICES [place], [place], [address] Phone: [phone]   Fax:  [phone]  Name: Alisha Kim MRN: [id number] Date of Birth: 12/24/1970

## 2017-04-14 ENCOUNTER — Encounter: Payer: Self-pay | Admitting: Physical Therapy

## 2017-04-14 ENCOUNTER — Ambulatory Visit: Payer: 59 | Attending: Family Medicine | Admitting: Physical Therapy

## 2017-04-14 DIAGNOSIS — M79601 Pain in right arm: Secondary | ICD-10-CM | POA: Diagnosis not present

## 2017-04-14 DIAGNOSIS — R293 Abnormal posture: Secondary | ICD-10-CM | POA: Insufficient documentation

## 2017-04-14 DIAGNOSIS — M542 Cervicalgia: Secondary | ICD-10-CM | POA: Diagnosis not present

## 2017-04-14 NOTE — Therapy (Signed)
[place] MAIN [facility] SERVICES [address] [place], [place], [address] Phone: [phone]   Fax:  [phone]  Physical Therapy Treatment  Patient Details  Name: Alisha Kim MRN: [id number] Date of Birth: 04-28-1971 Referring Provider: Enid Derry, MD;    Encounter Date: 04/14/2017  PT End of Session - 04/14/17 1748    Visit Number  3    Number of Visits  13    Date for PT Re-Evaluation  05/19/17    Authorization Type  [employer] employee    PT Start Time  5573    PT Stop Time  2202    PT Time Calculation (min)  30 min    Activity Tolerance  Patient tolerated treatment well;No increased pain    Behavior During Therapy  WFL for tasks assessed/performed       Past Medical History:  Diagnosis Date  . Adenomyosis 09/2015  . Allergy   . Anemia    history of  . Anxiety    controlled;   . Asthma   . Diabetes mellitus without complication ([place])    was told she was pre-diabetic  . Fibroids 08/14/2016   [place] March 2018  . History of Helicobacter pylori infection 08/08/2016   2004  . 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 Hypertension    controlled with medication;     Past Surgical History:  Procedure Laterality Date  . ABDOMINAL HYSTERECTOMY    . DENTAL SURGERY    . NO PAST SURGERIES    . TONSILLECTOMY      There were no vitals filed for this visit.  Subjective Assessment - 04/14/17 1747    Subjective  Patient reports, "I am sorry I am late. It is hard to get here by 5:30 with the traffic and with my work schedule." Patient reports otherwise doing well; She reports sleeping better at night and states that she is not having pain currently today; She reports that she hasn't had to take muscle relaxors and reports that she has only had 1 episode of the shooting pain down her arm since starting therapy;     Pertinent History  46 yo Female reports increased neck pain with RUE radiculopathy symptoms. She reports having sharp/burning pain  down RUE along C6/C7 nerve root distribution with occasional numbness. Patient reports symptoms are worse at night when trying to sleep. She reports multiple sleep disturbances. She reports being able to sleep about 3 hours at a time;  PMH significant for hysterectomy on April 23; She reports while recovering from that she started having the sharp pain; Reports that it has been intermittent which has progressed over last few months; Patient works for [employer] from home. She reports that she sits at her desk for 8 hours a day but is able to take short rest breaks in between; Patient is following up with Neurosurgeon; She has not had steroid injections at this time and prefers to try conservative treatment for this condition;     How long can you sit comfortably?  4 hours    How long can you stand comfortably?  NA    How long can you walk comfortably?  NA    Diagnostic tests  MRI shows disc degenerative changes; mild stenosis and foraminal narrowing; no fractures;     Patient Stated Goals  "not be in pain without pain meds" being able to sleep better;     Currently in Pain?  No/denies  TREATMENT Warm up on UBE BUE, level 2 forward/backward x3 min (Unbilled):  Standing: Doorway stretch 15 sec hold x2 reps; with min Vcs for positioning and to increase hold time for better stretch;   Standing facing wall: BUE low "V" to high V" stretch AAROM x5 reps with cues for positioning for better scapular strengthening;   Patient prone: RUE shoulder extension 1# 2x10 RUE shoulder low row 1# x10 Patient required min-moderate verbal/tactile cues for correct exercise technique including cues for positioning for better strengthening and scapular control;   Patient supine: BUE wand flexion 2# x10 with cues to avoid painful ROM for better shoulder flexibility; BUE wand chest press 2# x10 reps;  RUE shoulder flexion with elbow bent 1# x10 Educated patient on doing UE overhead lifting with  elbow bent at this time to reduce discomfort as patient has more pain with elbow straight;  Patient left sidelying: RUE shoulder ER 1# 2x12 with towel roll under elbow with cues to increase ROM and improve postural control for better strengthening;    Patient supine: (manual therapy) PT performed grade II-III inferior/posterior/anterior glenohumeral joint mobs to RUE 10 sec bouts x4 each; Patient denies any pain; PT performed median/radial nerve glides x10 reps with cues for head position for better flossing; Patient reports increased pull at end range of stretch;  Educated patient in ways to do neural glide in sitting for better ease;    Patient sitting: RUE shoulder flexion concurrent with inferior joint glide with mobilization belt (mobilization with movement) 2x10 with cues to increase ROM to tolerance and improve erect posture; Patient denies any increase in pain during this exercise exhibiting better tolerance with inferior joint glide;                       PT Education - 04/14/17 1748    Education provided  Yes    Education Details  HEP reinforced, UE stretches/strengthening;     Person(s) Educated  Patient    Methods  Explanation;Demonstration;Verbal cues    Comprehension  Verbalized understanding;Returned demonstration;Verbal cues required;Need further instruction       PT Short Term Goals - 04/07/17 1102      PT SHORT TERM GOAL #1   Title  Patient will be independent and compliant with nerve glides at least 5x a week, as a way to reduce RUE symptoms for better sleep tolerance;     Time  4    Period  Weeks    Status  New    Target Date  05/05/17      PT SHORT TERM GOAL #2   Title  Patient will be able to sleep at least 5 hours a night on average with less RUE symptoms over last week to exhibit less symptoms and for overall improved health.     Time  4    Period  Weeks    Status  New    Target Date  05/05/17      PT SHORT TERM GOAL #3    Title  Patient will decrease Quick DASH score by > 8 points demonstrating reduced self-reported upper extremity disability.    Time  4    Period  Weeks    Status  New    Target Date  05/05/17        PT Long Term Goals - 04/07/17 1104      PT LONG TERM GOAL #1   Title  Patient will be independent in home exercise program to  improve strength/mobility for better functional independence with ADLs.    Time  6    Period  Weeks    Status  New    Target Date  05/19/17      PT LONG TERM GOAL #2   Title  Patient will report a worst pain of 3/10 on VAS in   RUE          to improve tolerance with ADLs and reduced symptoms with activities.     Time  6    Period  Weeks    Status  New    Target Date  05/19/17      PT LONG TERM GOAL #3   Title  Patient will exhibit RUE shoulder AROM WFL without increase in pain in order to exhibit better tolerance with ADLs including showering, dressing, etc;     Time  6    Period  Weeks    Status  New    Target Date  05/19/17      PT LONG TERM GOAL #4   Title  patient will reduce quick DASH score to <20 to exhibit improved tolerance with ADLs and work tasks.     Time  6    Period  Weeks    Status  New    Target Date  05/19/17      PT LONG TERM GOAL #5   Title  patient will exhibit improved cervical AROM including: lateral flexion >30 degrees and rotation >55 degrees bilaterally to exhibit improved ROM for turning head when driving or doing other ADLs.     Time  6    Period  Weeks    Status  New    Target Date  05/19/17            Plan - 04/14/17 1814    Clinical Impression Statement  Patient late for session; instructed patient in advanced UE shoulder ROM/strengthening exercise. She reports slight discomfort during scapular strengthening; patient responded well to manual therapy with improved glenohumeral joint mobility and less pain with shoulder flexion after treatment; She has been compliant with HEP which has helped significantly with her  pain; She would benefit from additional skilled PT intervention to improve shoulder ROM and reduce pain in UE;     Rehab Potential  Good    Clinical Impairments Affecting Rehab Potential  chronic condition;     PT Frequency  2x / week    PT Duration  6 weeks    PT Treatment/Interventions  Cryotherapy;Electrical Stimulation;Moist Heat;Traction;Ultrasound;Neuromuscular re-education;Therapeutic exercise;Therapeutic activities;Functional mobility training;Patient/family education;Manual techniques;Passive range of motion;Dry needling;Energy conservation;Taping    PT Next Visit Plan  advance HEP, manual therapy    PT Home Exercise Plan  continue as given;     Consulted and Agree with Plan of Care  Patient       Patient will benefit from skilled therapeutic intervention in order to improve the following deficits and impairments:  Hypomobility, Impaired sensation, Decreased activity tolerance, Increased fascial restricitons, Impaired UE functional use, Pain, Decreased range of motion, Improper body mechanics, Postural dysfunction, Impaired flexibility  Visit Diagnosis: Cervicalgia  Pain in right arm  Abnormal posture     Problem List Patient Active Problem List   Diagnosis Date Noted  . S/P vaginal hysterectomy 09/29/2016  . Fibroids 08/14/2016  . History of Helicobacter pylori infection 08/08/2016  . Microcytosis 08/01/2016  . Heel spur, left 04/07/2016  . Bilateral foot pain 04/04/2016  . Obesity 04/04/2016  . Anxiety 04/04/2016  . Hypertension,  goal below 140/90 07/24/2015  . Cough 07/24/2015  . Bilateral hip pain 11/17/2014  . Atopic dermatitis 11/17/2014  . Low serum vitamin D 11/17/2014  . Pre-diabetes 11/17/2014  . Depression with anxiety 11/17/2014  . Mild persistent asthma with allergic rhinitis without complication 21/94/7125    Elisheva Fallas PT, DPT 04/14/2017, 6:15 PM  [place] MAIN [facility] SERVICES [address]  [place], [place], [address] Phone: [phone]   Fax:  [phone]  Name: VONETTE GROSSO MRN: [id number] Date of Birth: January 09, 1971

## 2017-04-16 ENCOUNTER — Ambulatory Visit: Payer: 59 | Admitting: Physical Therapy

## 2017-04-16 ENCOUNTER — Encounter: Payer: Self-pay | Admitting: Physical Therapy

## 2017-04-16 DIAGNOSIS — M542 Cervicalgia: Secondary | ICD-10-CM

## 2017-04-16 DIAGNOSIS — R293 Abnormal posture: Secondary | ICD-10-CM

## 2017-04-16 DIAGNOSIS — M79601 Pain in right arm: Secondary | ICD-10-CM | POA: Diagnosis not present

## 2017-04-16 NOTE — Therapy (Signed)
[place] MAIN [facility] SERVICES [address] [place], [place], [address] Phone: [phone]   Fax:  [phone]  Physical Therapy Treatment  Patient Details  Name: Alisha Kim MRN: [id number] Date of Birth: 10/26/1970 Referring Provider: Enid Derry, MD;    Encounter Date: 04/16/2017  PT End of Session - 04/16/17 1751    Visit Number  4    Number of Visits  13    Date for PT Re-Evaluation  05/19/17    Authorization Type  [employer] employee    PT Start Time  8938    PT Stop Time  1017    PT Time Calculation (min)  45 min    Activity Tolerance  Patient tolerated treatment well;No increased pain    Behavior During Therapy  WFL for tasks assessed/performed       Past Medical History:  Diagnosis Date  . Adenomyosis 09/2015  . Allergy   . Anemia    history of  . Anxiety    controlled;   . Asthma   . Diabetes mellitus without complication ([place])    was told she was pre-diabetic  . Fibroids 08/14/2016   [place] March 2018  . History of Helicobacter pylori infection 08/08/2016   2004  . 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 Hypertension    controlled with medication;     Past Surgical History:  Procedure Laterality Date  . ABDOMINAL HYSTERECTOMY    . DENTAL SURGERY    . NO PAST SURGERIES    . TONSILLECTOMY      There were no vitals filed for this visit.  Subjective Assessment - 04/16/17 1750    Subjective  Patient reports doing well; She reports sleeping better and has not had to take any pain killers; She reports compliance with HEP; Patient reports feeling a pulling every now and then but not severe pain;     Pertinent History  46 yo Female reports increased neck pain with RUE radiculopathy symptoms. She reports having sharp/burning pain down RUE along C6/C7 nerve root distribution with occasional numbness. Patient reports symptoms are worse at night when trying to sleep. She reports multiple sleep disturbances. She reports being able  to sleep about 3 hours at a time;  PMH significant for hysterectomy on April 23; She reports while recovering from that she started having the sharp pain; Reports that it has been intermittent which has progressed over last few months; Patient works for [employer] from home. She reports that she sits at her desk for 8 hours a day but is able to take short rest breaks in between; Patient is following up with Neurosurgeon; She has not had steroid injections at this time and prefers to try conservative treatment for this condition;     How long can you sit comfortably?  4 hours    How long can you stand comfortably?  NA    How long can you walk comfortably?  NA    Diagnostic tests  MRI shows disc degenerative changes; mild stenosis and foraminal narrowing; no fractures;     Patient Stated Goals  "not be in pain without pain meds" being able to sleep better;     Currently in Pain?  No/denies         [facility] PT Assessment - 04/16/17 0001      AROM   Cervical - Right Rotation  55    Cervical - Left Rotation  55       TREATMENT  Warm up on UBE BUE, level 2 forward/backward x3 min (Unbilled):  Standing facing wall: BUE low "V" to high V" stretch AAROM x10 reps with cues for positioning for better scapular strengthening;  BUE horizontal abduction AROM x10 reps with cues to relax shoulder elevation for better scapular retraction Alternate UE lift off wall with arms overhead for better scapular retraction x5 bilaterally;  Patient prone: RUE shoulder extension 1# 2x12 RUE shoulder low row 1# x12 RUE shoulder mid row 1# x10  Patient required min-moderate verbal/tactile cues for correct exercise technique including cues for positioning for better strengthening and scapular control;   Patient supine: BUE wand flexion 2# x10 with cues to avoid painful ROM for better shoulder flexibility; BUE wand chest press 2# x15 reps; RUE shoulder  At 90 degrees flexion small circles  clockwise/counterclockwise x10 each with cues to keep elbow straight for better shoulder strengthening;   Patient left sidelying: RUE shoulder ER 1# 2x15 with towel roll under elbow with cues to increase ROM and improve postural control for better strengthening;  Patient supine: (manual therapy) PT performed grade II-III inferior/posterior/anterior glenohumeral joint mobs to RUE 10 sec bouts x4 each; Patient denies any pain;  Patient sitting: RUE shoulder flexion concurrent with inferior joint glide with mobilization belt (mobilization with movement) 2x10 with cues to increase ROM to tolerance and improve erect posture; Patient denies any increase in pain during this exercise exhibiting better tolerance with inferior joint glide;   PT assessed cervical ROM at end of session; Patient exhibits improved ROM to 55 degrees bilaterally without increase in pain;   Advanced HEP with cervical stretches including rotation/lateral flexion ROM, levator scapulae stretch 20 sec hold x2 bilaterally to improve neck flexibility; Patient tolerated session well with no pain at end of session;                   PT Education - 04/16/17 1750    Education provided  Yes    Education Details  HEP reinforced, UE stretches/strengthening;     Person(s) Educated  Patient    Methods  Explanation;Demonstration;Verbal cues    Comprehension  Verbalized understanding;Returned demonstration;Verbal cues required;Need further instruction       PT Short Term Goals - 04/07/17 1102      PT SHORT TERM GOAL #1   Title  Patient will be independent and compliant with nerve glides at least 5x a week, as a way to reduce RUE symptoms for better sleep tolerance;     Time  4    Period  Weeks    Status  New    Target Date  05/05/17      PT SHORT TERM GOAL #2   Title  Patient will be able to sleep at least 5 hours a night on average with less RUE symptoms over last week to exhibit less symptoms and for  overall improved health.     Time  4    Period  Weeks    Status  New    Target Date  05/05/17      PT SHORT TERM GOAL #3   Title  Patient will decrease Quick DASH score by > 8 points demonstrating reduced self-reported upper extremity disability.    Time  4    Period  Weeks    Status  New    Target Date  05/05/17        PT Long Term Goals - 04/07/17 1104      PT LONG TERM GOAL #1  Title  Patient will be independent in home exercise program to improve strength/mobility for better functional independence with ADLs.    Time  6    Period  Weeks    Status  New    Target Date  05/19/17      PT LONG TERM GOAL #2   Title  Patient will report a worst pain of 3/10 on VAS in   RUE          to improve tolerance with ADLs and reduced symptoms with activities.     Time  6    Period  Weeks    Status  New    Target Date  05/19/17      PT LONG TERM GOAL #3   Title  Patient will exhibit RUE shoulder AROM WFL without increase in pain in order to exhibit better tolerance with ADLs including showering, dressing, etc;     Time  6    Period  Weeks    Status  New    Target Date  05/19/17      PT LONG TERM GOAL #4   Title  patient will reduce quick DASH score to <20 to exhibit improved tolerance with ADLs and work tasks.     Time  6    Period  Weeks    Status  New    Target Date  05/19/17      PT LONG TERM GOAL #5   Title  patient will exhibit improved cervical AROM including: lateral flexion >30 degrees and rotation >55 degrees bilaterally to exhibit improved ROM for turning head when driving or doing other ADLs.     Time  6    Period  Weeks    Status  New    Target Date  05/19/17            Plan - 04/16/17 1800    Clinical Impression Statement  Patient instructed in advanced RUE shoulder strengthening exercise to facilitate better stabilization and reduce shoulder pain; Patient does require min VCs for correct positioning and exercise technique: PT performed manual therapy to  facilitate better glenohumeral joint mobility for less shoulder pain; She reports overall improved symptoms with worst pain of 4/10 in last week and better sleeping tolerance; Patient would benefit from additional skilled PT intervention to improve shoulder ROM, posture and reduce pain with ADLS;     Rehab Potential  Good    Clinical Impairments Affecting Rehab Potential  chronic condition;     PT Frequency  2x / week    PT Duration  6 weeks    PT Treatment/Interventions  Cryotherapy;Electrical Stimulation;Moist Heat;Traction;Ultrasound;Neuromuscular re-education;Therapeutic exercise;Therapeutic activities;Functional mobility training;Patient/family education;Manual techniques;Passive range of motion;Dry needling;Energy conservation;Taping    PT Next Visit Plan  advance HEP, manual therapy    PT Home Exercise Plan  continue as given;     Consulted and Agree with Plan of Care  Patient       Patient will benefit from skilled therapeutic intervention in order to improve the following deficits and impairments:  Hypomobility, Impaired sensation, Decreased activity tolerance, Increased fascial restricitons, Impaired UE functional use, Pain, Decreased range of motion, Improper body mechanics, Postural dysfunction, Impaired flexibility  Visit Diagnosis: Cervicalgia  Pain in right arm  Abnormal posture     Problem List Patient Active Problem List   Diagnosis Date Noted  . S/P vaginal hysterectomy 09/29/2016  . Fibroids 08/14/2016  . History of Helicobacter pylori infection 08/08/2016  . Microcytosis 08/01/2016  . Heel spur,  left 04/07/2016  . Bilateral foot pain 04/04/2016  . Obesity 04/04/2016  . Anxiety 04/04/2016  . Hypertension, goal below 140/90 07/24/2015  . Cough 07/24/2015  . Bilateral hip pain 11/17/2014  . Atopic dermatitis 11/17/2014  . Low serum vitamin D 11/17/2014  . Pre-diabetes 11/17/2014  . Depression with anxiety 11/17/2014  . Mild persistent asthma with allergic  rhinitis without complication 13/24/4010    Justice Milliron PT, DPT 04/16/2017, 6:27 PM  [place] MAIN [facility] SERVICES [address] [place], [place], [address] Phone: [phone]   Fax:  [phone]  Name: Alisha Kim MRN: [id number] Date of Birth: 1971/02/28

## 2017-04-16 NOTE — Patient Instructions (Signed)
  AROM, Rotation   Sit or stand, head comfortable, centered position. Turn head slowly to look over one shoulder. Hold _2__ seconds. Repeat to other side. Repeat _10__ times per session. Do _2-3__ sessions per day.  Copyright  VHI. All rights reserved.  Ear / Shoulder Stretch   Exhaling, move left ear toward left shoulder. Hold position for _2-3__sec. Inhaling, bring head back to center. Repeat to other side. Repeat sequence _10__ times. Do __2-3_ times per day.   Levator Scapula Stretch, Sitting   Sit, one hand tucked under hip on side to be stretched, other hand over top of head. Turn head toward other side and look down (like looking under armpit). Use hand on head to gently stretch neck in that position. Hold __10_ seconds. Repeat _5__ times per session. Do _2-3__ sessions per day.    Copyright  VHI. All rights reserved.  Tips for Range of Motion Set aside same time of day for exercise, so it becomes part of the daily routine. All hand holds should be gentle. Move slowly, cautiously and with good control. Once resistance is felt, push no further. Quality of stretch is more important than quantity.  Copyright  VHI. All rights reserved.

## 2017-04-21 ENCOUNTER — Ambulatory Visit: Payer: 59 | Admitting: Physical Therapy

## 2017-04-23 ENCOUNTER — Encounter: Payer: Self-pay | Admitting: Physical Therapy

## 2017-04-23 ENCOUNTER — Ambulatory Visit: Payer: 59 | Admitting: Physical Therapy

## 2017-04-23 DIAGNOSIS — R293 Abnormal posture: Secondary | ICD-10-CM | POA: Diagnosis not present

## 2017-04-23 DIAGNOSIS — M79601 Pain in right arm: Secondary | ICD-10-CM | POA: Diagnosis not present

## 2017-04-23 DIAGNOSIS — M542 Cervicalgia: Secondary | ICD-10-CM | POA: Diagnosis not present

## 2017-04-23 NOTE — Therapy (Signed)
[place] MAIN [facility] SERVICES [address] [place], [place], [address] Phone: [phone]   Fax:  [phone]  Physical Therapy Treatment  Patient Details  Name: Alisha Kim MRN: [id number] Date of Birth: Feb 21, 1971 Referring Provider: Enid Derry, MD;    Encounter Date: 04/23/2017    Past Medical History:  Diagnosis Date  . Adenomyosis 09/2015  . Allergy   . Anemia    history of  . Anxiety    controlled;   . Asthma   . Diabetes mellitus without complication ([place])    was told she was pre-diabetic  . Fibroids 08/14/2016   [place] March 2018  . History of Helicobacter pylori infection 08/08/2016   2004  . 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 Hypertension    controlled with medication;     Past Surgical History:  Procedure Laterality Date  . ABDOMINAL HYSTERECTOMY    . DENTAL SURGERY    . NO PAST SURGERIES    . TONSILLECTOMY    . VAGINAL HYSTERECTOMY Bilateral 09/29/2016   Procedure: HYSTERECTOMY VAGINAL WITH BILATERAL SALPINGECTOMY;  Surgeon: [name], MD;  Location: [facility] ORS;  Service: Gynecology;  Laterality: Bilateral;    There were no vitals filed for this visit.  Subjective Assessment - 04/23/17 1743    Subjective  Patient reports having increased pain this week. "I have had more neck/shoulder pain. Its been about a 7/10"; She reports having tingling in fingers today in left hand middle fingers; She reports working more hours and with the weather change a combination could have contributed to discomfort;     Pertinent History  46 yo Female reports increased neck pain with RUE radiculopathy symptoms. She reports having sharp/burning pain down RUE along C6/C7 nerve root distribution with occasional numbness. Patient reports symptoms are worse at night when trying to sleep. She reports multiple sleep disturbances. She reports being able to sleep about 3 hours at a time;  PMH significant for hysterectomy on April 23; She reports  while recovering from that she started having the sharp pain; Reports that it has been intermittent which has progressed over last few months; Patient works for [employer] from home. She reports that she sits at her desk for 8 hours a day but is able to take short rest breaks in between; Patient is following up with Neurosurgeon; She has not had steroid injections at this time and prefers to try conservative treatment for this condition;     How long can you sit comfortably?  4 hours    How long can you stand comfortably?  NA    How long can you walk comfortably?  NA    Diagnostic tests  MRI shows disc degenerative changes; mild stenosis and foraminal narrowing; no fractures;     Patient Stated Goals  "not be in pain without pain meds" being able to sleep better;     Currently in Pain?  Yes    Pain Score  6     Pain Location  Neck    Pain Orientation  Right    Pain Descriptors / Indicators  Sharp;Shooting    Pain Type  Chronic pain    Pain Radiating Towards  radiates down shoulder to fingers;     Pain Onset  More than a month ago    Pain Frequency  Intermittent    Aggravating Factors   worse with increased work tasks; worse in the evening;     Pain Relieving Factors  stretches help,  good positioning;     Effect of Pain on Daily Activities  decreased activity tolerance;     Multiple Pain Sites  No          TREATMENT Patient supine: PT applied moist heat to cervical spine x5 min (unbilled):  Patient supine: (manual therapy) Suboccipital release 20 sec hold x5 reps; Passive upper trap stretch 20 sec hold x2 reps right side only; Passive levator scapulae stretch R only, 20 sec hold x2 reps; Gentle lateral translation PROM x5 reps each direction; Patient required min Vcs to relax during manual therapy for less discomfort in cervical spine and better tissue extensibility;   PT performed grade II-III inferior/posterior/anterior glenohumeral joint mobs to RUE 10 sec bouts x3 each; Patient  reports pain down arm with anterior joint mobs (those held) PT performed median/radial nerve glides x10 reps with cues for head position for better flossing; Patient reports increased pull at end range of stretch;   Sitting: Right scalene stretch 20 sec hold x2 reps with min VCs for positioning for better stretch;  posterior shoulder rolls 2x10 with cues to increase push down for better stretch;  Standing doorway stretch with BUE 15 sec hold x3 reps with cues for positioning for better stretch;  Patient left sidelying: RUE shoulder ER 1# 2x15 with towel roll under elbow with cues to increase ROM and improve postural control for better strengthening;   Standing tower machine 2.5# RUE shoulder ER 2x5 reps with min Vcs for positioning to isolate shoulder ER strengthening;   Standing facing wall: BUE shoulder flexion against wall for shoulder stretch x10 reps with cues to avoid shoulder elevation RUE holding small red ball, flexion at 90 degrees holding ball at wall, small circles clockwise/counterclockwise with cues to increase scapular stabilization for better shoulder control;  Patient sitting: RUE shoulder flexion concurrent with inferior joint glide with mobilization belt (mobilization with movement) 2x10 with cues to increase ROM to tolerance and improve erect posture; Patient denies any increase in pain during this exercise exhibiting better tolerance with inferior joint glide;   Tolerated well, reports no pain at end of session;                      PT Education - 04/23/17 1820    Education provided  Yes    Education Details  HEP reinforced, UE stretches/manual therapy;     Person(s) Educated  Patient    Methods  Explanation;Demonstration;Verbal cues    Comprehension  Verbalized understanding;Returned demonstration;Verbal cues required;Need further instruction       PT Short Term Goals - 04/07/17 1102      PT SHORT TERM GOAL #1   Title  Patient will be  independent and compliant with nerve glides at least 5x a week, as a way to reduce RUE symptoms for better sleep tolerance;     Time  4    Period  Weeks    Status  New    Target Date  05/05/17      PT SHORT TERM GOAL #2   Title  Patient will be able to sleep at least 5 hours a night on average with less RUE symptoms over last week to exhibit less symptoms and for overall improved health.     Time  4    Period  Weeks    Status  New    Target Date  05/05/17      PT SHORT TERM GOAL #3   Title  Patient will decrease  Quick DASH score by > 8 points demonstrating reduced self-reported upper extremity disability.    Time  4    Period  Weeks    Status  New    Target Date  05/05/17        PT Long Term Goals - 04/07/17 1104      PT LONG TERM GOAL #1   Title  Patient will be independent in home exercise program to improve strength/mobility for better functional independence with ADLs.    Time  6    Period  Weeks    Status  New    Target Date  05/19/17      PT LONG TERM GOAL #2   Title  Patient will report a worst pain of 3/10 on VAS in   RUE          to improve tolerance with ADLs and reduced symptoms with activities.     Time  6    Period  Weeks    Status  New    Target Date  05/19/17      PT LONG TERM GOAL #3   Title  Patient will exhibit RUE shoulder AROM WFL without increase in pain in order to exhibit better tolerance with ADLs including showering, dressing, etc;     Time  6    Period  Weeks    Status  New    Target Date  05/19/17      PT LONG TERM GOAL #4   Title  patient will reduce quick DASH score to <20 to exhibit improved tolerance with ADLs and work tasks.     Time  6    Period  Weeks    Status  New    Target Date  05/19/17      PT LONG TERM GOAL #5   Title  patient will exhibit improved cervical AROM including: lateral flexion >30 degrees and rotation >55 degrees bilaterally to exhibit improved ROM for turning head when driving or doing other ADLs.     Time  6     Period  Weeks    Status  New    Target Date  05/19/17            Plan - 04/23/17 1804    Clinical Impression Statement  Patient reports increased soreness today as a result of increased work tasks and fatigue. PT performed extensive soft tissue stretch/mobilization to cervical and R shoulder; Patient reports feeling less tightness after manual therapy; She did have some discomfort with anterior joint mobs to right shoulder; Patient is getting a massage this weekend; educated patient on proper positioning to avoid excessive internal rotation of right shoulder for less pain; Patient reports less tightness and discomfort at end of session; She would benefit from additional skilled PT intervention to improve strength/ROM and reduce pain with ADLs;     Rehab Potential  Good    Clinical Impairments Affecting Rehab Potential  chronic condition;     PT Frequency  2x / week    PT Duration  6 weeks    PT Treatment/Interventions  Cryotherapy;Electrical Stimulation;Moist Heat;Traction;Ultrasound;Neuromuscular re-education;Therapeutic exercise;Therapeutic activities;Functional mobility training;Patient/family education;Manual techniques;Passive range of motion;Dry needling;Energy conservation;Taping    PT Next Visit Plan  advance HEP, manual therapy    PT Home Exercise Plan  continue as given;     Consulted and Agree with Plan of Care  Patient       Patient will benefit from skilled therapeutic intervention in order to improve the following  deficits and impairments:  Hypomobility, Impaired sensation, Decreased activity tolerance, Increased fascial restricitons, Impaired UE functional use, Pain, Decreased range of motion, Improper body mechanics, Postural dysfunction, Impaired flexibility  Visit Diagnosis: Cervicalgia  Pain in right arm  Abnormal posture     Problem List Patient Active Problem List   Diagnosis Date Noted  . S/P vaginal hysterectomy 09/29/2016  . Fibroids 08/14/2016  .  History of Helicobacter pylori infection 08/08/2016  . Microcytosis 08/01/2016  . Heel spur, left 04/07/2016  . Bilateral foot pain 04/04/2016  . Obesity 04/04/2016  . Anxiety 04/04/2016  . Hypertension, goal below 140/90 07/24/2015  . Cough 07/24/2015  . Bilateral hip pain 11/17/2014  . Atopic dermatitis 11/17/2014  . Low serum vitamin D 11/17/2014  . Pre-diabetes 11/17/2014  . Depression with anxiety 11/17/2014  . Mild persistent asthma with allergic rhinitis without complication 07/20/1550    Pancho Rushing PT, DPT 04/23/2017, 6:20 PM  [place] MAIN [facility] SERVICES [address] [place], [place], [address] Phone: [phone]   Fax:  [phone]  Name: Alisha Kim MRN: [id number] Date of Birth: Apr 19, 1971

## 2017-04-27 ENCOUNTER — Encounter: Payer: Self-pay | Admitting: Physical Therapy

## 2017-04-27 ENCOUNTER — Ambulatory Visit: Payer: 59 | Admitting: Physical Therapy

## 2017-04-27 DIAGNOSIS — M79601 Pain in right arm: Secondary | ICD-10-CM | POA: Diagnosis not present

## 2017-04-27 DIAGNOSIS — M542 Cervicalgia: Secondary | ICD-10-CM

## 2017-04-27 DIAGNOSIS — R293 Abnormal posture: Secondary | ICD-10-CM | POA: Diagnosis not present

## 2017-04-27 NOTE — Therapy (Signed)
[place] MAIN [facility] SERVICES [address] [place], [place], [address] Phone: [phone]   Fax:  [phone]  Physical Therapy Treatment  Patient Details  Name: Alisha Kim MRN: [id number] Date of Birth: 06/20/1970 Referring Provider: Enid Derry, MD;    Encounter Date: 04/27/2017  PT End of Session - 04/27/17 1752    Visit Number  5    Number of Visits  13    Date for PT Re-Evaluation  05/19/17    Authorization Type  [employer] employee    PT Start Time  2993    PT Stop Time  7169    PT Time Calculation (min)  40 min    Activity Tolerance  Patient tolerated treatment well;No increased pain    Behavior During Therapy  WFL for tasks assessed/performed       Past Medical History:  Diagnosis Date  . Adenomyosis 09/2015  . Allergy   . Anemia    history of  . Anxiety    controlled;   . Asthma   . Diabetes mellitus without complication ([place])    was told she was pre-diabetic  . Fibroids 08/14/2016   [place] March 2018  . History of Helicobacter pylori infection 08/08/2016   2004  . 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 Hypertension    controlled with medication;     Past Surgical History:  Procedure Laterality Date  . ABDOMINAL HYSTERECTOMY    . DENTAL SURGERY    . HYSTERECTOMY VAGINAL WITH BILATERAL SALPINGECTOMY Bilateral 09/29/2016   Performed by [name], MD at [facility] ORS  . NO PAST SURGERIES    . TONSILLECTOMY      There were no vitals filed for this visit.  Subjective Assessment - 04/27/17 1750    Subjective  Patient reports feeling better today as compared to last week; She reports having a good massage for her birthday which helped some with her pain;     Pertinent History  46 yo Female reports increased neck pain with RUE radiculopathy symptoms. She reports having sharp/burning pain down RUE along C6/C7 nerve root distribution with occasional numbness. Patient reports symptoms are worse at night when trying to sleep.  She reports multiple sleep disturbances. She reports being able to sleep about 3 hours at a time;  PMH significant for hysterectomy on April 23; She reports while recovering from that she started having the sharp pain; Reports that it has been intermittent which has progressed over last few months; Patient works for [employer] from home. She reports that she sits at her desk for 8 hours a day but is able to take short rest breaks in between; Patient is following up with Neurosurgeon; She has not had steroid injections at this time and prefers to try conservative treatment for this condition;     How long can you sit comfortably?  4 hours    How long can you stand comfortably?  NA    How long can you walk comfortably?  NA    Diagnostic tests  MRI shows disc degenerative changes; mild stenosis and foraminal narrowing; no fractures;     Patient Stated Goals  "not be in pain without pain meds" being able to sleep better;     Currently in Pain?  No/denies    Pain Onset  --           TREATMENT Warm up on UBE BUE, level 2 backward x3 min (Unbilled):  Standing facing wall: BUE low "  V" to high V" stretch AAROM x10 reps with cues for positioning for better scapular strengthening;  BUE horizontal abduction AROM x10 reps with cues to relax shoulder elevation for better scapular retraction Alternate UE lift off wall with arms overhead for better scapular retraction x5 bilaterally;  Patient prone: RUE shoulder extension 1# 2x15 RUE shoulder low row 1# x15 RUE shoulder mid row 1# x12 Patient required min-moderate verbal/tactile cues for correct exercise techniqueincluding cues for positioning for better strengthening and scapular control;  Patient supine: BUE wand flexion 3# x15 with cues to avoid painful ROM for better shoulder flexibility; BUE wand chest press 3# x15 reps; RUE shoulder  At 90 degrees flexion small circles clockwise/counterclockwise 2# x10 each with cues to keep elbow  straight for better shoulder strengthening;   Patient left sidelying: RUE shoulder ER 1# 2x15 with towel roll under elbow with cues to increase ROM and improve postural control for better strengthening;  Patient sitting: RUE shoulder flexion concurrent with inferior joint glide with mobilization belt (mobilization with movement) 3x10 with cues to increase ROM to tolerance and improve erect posture; Patient denies any increase in pain during this exercise exhibiting better tolerance with inferior joint glide;  Standing facing wall: BUE low "V" to high "V" stretch for scapular/shoulder flexion x10 reps with cues for UE positioning for better movement; BUE overhead against wall, alternate UE lift off (retraction) x10 bilaterally; Patient required min VCs to avoid painful ROM;  RUE small red ball against wall with shoulder at 90 degrees flexion, rhythmic stabilization with manual resistance in multiple directions 30 sec hold x2 reps; RUE small red ball against wall, shoulder at 90 degrees flexion, elbow neutral, small circles clockwise/counterclockwise x10 each to facilitate better stabilization of shoulder;   Doorway stretch 20 sec hold x2 bilaterally for major and minor pect stretches each;   BUE alphabet yellow weighted ball (4#) A-Z capital letters x1 set with cues to increase ROM to tolerance; Patient reports increased fatigue at end of exercise;    Patient tolerated session well with no pain at end of session;                     PT Education - 04/27/17 1752    Education provided  Yes    Education Details  HEP reinforced, UE stretches/strengthening;     Person(s) Educated  Patient    Methods  Explanation;Demonstration;Verbal cues    Comprehension  Verbalized understanding;Returned demonstration;Verbal cues required;Need further instruction       PT Short Term Goals - 04/07/17 1102      PT SHORT TERM GOAL #1   Title  Patient will be independent and compliant  with nerve glides at least 5x a week, as a way to reduce RUE symptoms for better sleep tolerance;     Time  4    Period  Weeks    Status  New    Target Date  05/05/17      PT SHORT TERM GOAL #2   Title  Patient will be able to sleep at least 5 hours a night on average with less RUE symptoms over last week to exhibit less symptoms and for overall improved health.     Time  4    Period  Weeks    Status  New    Target Date  05/05/17      PT SHORT TERM GOAL #3   Title  Patient will decrease Quick DASH score by > 8 points demonstrating  reduced self-reported upper extremity disability.    Time  4    Period  Weeks    Status  New    Target Date  05/05/17        PT Long Term Goals - 04/07/17 1104      PT LONG TERM GOAL #1   Title  Patient will be independent in home exercise program to improve strength/mobility for better functional independence with ADLs.    Time  6    Period  Weeks    Status  New    Target Date  05/19/17      PT LONG TERM GOAL #2   Title  Patient will report a worst pain of 3/10 on VAS in   RUE          to improve tolerance with ADLs and reduced symptoms with activities.     Time  6    Period  Weeks    Status  New    Target Date  05/19/17      PT LONG TERM GOAL #3   Title  Patient will exhibit RUE shoulder AROM WFL without increase in pain in order to exhibit better tolerance with ADLs including showering, dressing, etc;     Time  6    Period  Weeks    Status  New    Target Date  05/19/17      PT LONG TERM GOAL #4   Title  patient will reduce quick DASH score to <20 to exhibit improved tolerance with ADLs and work tasks.     Time  6    Period  Weeks    Status  New    Target Date  05/19/17      PT LONG TERM GOAL #5   Title  patient will exhibit improved cervical AROM including: lateral flexion >30 degrees and rotation >55 degrees bilaterally to exhibit improved ROM for turning head when driving or doing other ADLs.     Time  6    Period  Weeks     Status  New    Target Date  05/19/17            Plan - 04/27/17 1815    Clinical Impression Statement  Patient reports less pain today; Advanced shoulder/scapular strengthening for better glenohumeral joint mobility and less pain; Patient does require min Vcs for correct positioning and UE movement for better shoulder strengthening; She denies any increase in pain with advanced exercise; She would benefit from additional skilled PT intervention to improve strength, ROM and reduce pain with ADLs;     Rehab Potential  Good    Clinical Impairments Affecting Rehab Potential  chronic condition;     PT Frequency  2x / week    PT Duration  6 weeks    PT Treatment/Interventions  Cryotherapy;Electrical Stimulation;Moist Heat;Traction;Ultrasound;Neuromuscular re-education;Therapeutic exercise;Therapeutic activities;Functional mobility training;Patient/family education;Manual techniques;Passive range of motion;Dry needling;Energy conservation;Taping    PT Next Visit Plan  advance HEP, manual therapy    PT Home Exercise Plan  continue as given;     Consulted and Agree with Plan of Care  Patient       Patient will benefit from skilled therapeutic intervention in order to improve the following deficits and impairments:  Hypomobility, Impaired sensation, Decreased activity tolerance, Increased fascial restricitons, Impaired UE functional use, Pain, Decreased range of motion, Improper body mechanics, Postural dysfunction, Impaired flexibility  Visit Diagnosis: Cervicalgia  Pain in right arm  Abnormal posture  Problem List Patient Active Problem List   Diagnosis Date Noted  . S/P vaginal hysterectomy 09/29/2016  . Fibroids 08/14/2016  . History of Helicobacter pylori infection 08/08/2016  . Microcytosis 08/01/2016  . Heel spur, left 04/07/2016  . Bilateral foot pain 04/04/2016  . Obesity 04/04/2016  . Anxiety 04/04/2016  . Hypertension, goal below 140/90 07/24/2015  . Cough  07/24/2015  . Bilateral hip pain 11/17/2014  . Atopic dermatitis 11/17/2014  . Low serum vitamin D 11/17/2014  . Pre-diabetes 11/17/2014  . Depression with anxiety 11/17/2014  . Mild persistent asthma with allergic rhinitis without complication 75/10/1831    Alexandre Faries PT, DPT 04/27/2017, 6:24 PM  [place] MAIN [facility] SERVICES [address] [place], [place], [address] Phone: [phone]   Fax:  [phone]  Name: Alisha Kim MRN: [id number] Date of Birth: 10-28-1970

## 2017-04-28 ENCOUNTER — Ambulatory Visit: Payer: 59 | Admitting: Physical Therapy

## 2017-05-07 ENCOUNTER — Encounter: Payer: Self-pay | Admitting: Physical Therapy

## 2017-05-07 ENCOUNTER — Ambulatory Visit: Payer: 59 | Admitting: Physical Therapy

## 2017-05-07 DIAGNOSIS — M79601 Pain in right arm: Secondary | ICD-10-CM | POA: Diagnosis not present

## 2017-05-07 DIAGNOSIS — R293 Abnormal posture: Secondary | ICD-10-CM | POA: Diagnosis not present

## 2017-05-07 DIAGNOSIS — M542 Cervicalgia: Secondary | ICD-10-CM

## 2017-05-07 NOTE — Therapy (Signed)
[place] MAIN [facility] SERVICES [address] [place], [place], [address] Phone: [phone]   Fax:  [phone]  Physical Therapy Treatment/Discharge Summary  Patient Details  Name: Alisha Kim MRN: [id number] Date of Birth: 1970-10-12 Referring Provider: Enid Derry, MD;    Encounter Date: 05/07/2017  PT End of Session - 05/07/17 1802    Visit Number  6    Number of Visits  13    Date for PT Re-Evaluation  05/19/17    Authorization Type  [employer] employee    PT Start Time  1800    PT Stop Time  2025    PT Time Calculation (min)  30 min    Activity Tolerance  Patient tolerated treatment well;No increased pain    Behavior During Therapy  WFL for tasks assessed/performed       Past Medical History:  Diagnosis Date  . Adenomyosis 09/2015  . Allergy   . Anemia    history of  . Anxiety    controlled;   . Asthma   . Diabetes mellitus without complication ([place])    was told she was pre-diabetic  . Fibroids 08/14/2016   [place] March 2018  . History of Helicobacter pylori infection 08/08/2016   2004  . 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 Hypertension    controlled with medication;     Past Surgical History:  Procedure Laterality Date  . ABDOMINAL HYSTERECTOMY    . DENTAL SURGERY    . NO PAST SURGERIES    . TONSILLECTOMY    . VAGINAL HYSTERECTOMY Bilateral 09/29/2016   Procedure: HYSTERECTOMY VAGINAL WITH BILATERAL SALPINGECTOMY;  Surgeon: [name], MD;  Location: [facility] ORS;  Service: Gynecology;  Laterality: Bilateral;    There were no vitals filed for this visit.  Subjective Assessment - 05/07/17 1801    Subjective  Patient reports, "I am sorry I am late. I got in traffic on 40" She reports doing well otherwise with no pain in neck or shoulder;     Pertinent History  46 yo Female reports increased neck pain with RUE radiculopathy symptoms. She reports having sharp/burning pain down RUE along C6/C7 nerve root distribution with  occasional numbness. Patient reports symptoms are worse at night when trying to sleep. She reports multiple sleep disturbances. She reports being able to sleep about 3 hours at a time;  PMH significant for hysterectomy on April 23; She reports while recovering from that she started having the sharp pain; Reports that it has been intermittent which has progressed over last few months; Patient works for [employer] from home. She reports that she sits at her desk for 8 hours a day but is able to take short rest breaks in between; Patient is following up with Neurosurgeon; She has not had steroid injections at this time and prefers to try conservative treatment for this condition;     How long can you sit comfortably?  4 hours    How long can you stand comfortably?  NA    How long can you walk comfortably?  NA    Diagnostic tests  MRI shows disc degenerative changes; mild stenosis and foraminal narrowing; no fractures;     Patient Stated Goals  "not be in pain without pain meds" being able to sleep better;     Currently in Pain?  No/denies         [facility] PT Assessment - 05/07/17 0001      Observation/Other Assessments  Quick DASH   13.67% (the lower the score the less disability, improved from 47% on 04/06/17)      AROM   Cervical Flexion  45    Cervical Extension  45    Cervical - Right Side Bend  31    Cervical - Left Side Bend  35    Cervical - Right Rotation  60    Cervical - Left Rotation  55      RUE shoulder ROM is WFL with no pain at end range flexion/abduction ER/IR;  TREATMENT: Warm up on UBE BUE level 2 backwards only x3 min (unbilled):  Instructed patient in quick dash, assessed shoulder and cervical ROM; see above;   Patient exhibits improved ROM with less pain and less catching;  Advanced HEP: Standing red tband shoulder ER RUE only x10 reps; Patient required min VCs for positioning for better strengthening;  Reinforced HEP with instruction on how often to do exercise  for continued maintenance; Patient verbalized understanding;   She is agreeable to discharge at this time stating, "I feel so much better and have been really pleased."                  PT Education - 05/07/17 1802    Education provided  Yes    Education Details  Recommendations, HEP reinfroced, progress towards goals;     Person(s) Educated  Patient    Methods  Explanation;Verbal cues;Demonstration    Comprehension  Verbalized understanding;Returned demonstration;Verbal cues required;Need further instruction       PT Short Term Goals - 05/07/17 1803      PT SHORT TERM GOAL #1   Title  Patient will be independent and compliant with nerve glides at least 5x a week, as a way to reduce RUE symptoms for better sleep tolerance;     Time  4    Period  Weeks    Status  Achieved      PT SHORT TERM GOAL #2   Title  Patient will be able to sleep at least 5 hours a night on average with less RUE symptoms over last week to exhibit less symptoms and for overall improved health.     Time  4    Period  Weeks    Status  Achieved      PT SHORT TERM GOAL #3   Title  Patient will decrease Quick DASH score by > 8 points demonstrating reduced self-reported upper extremity disability.    Time  4    Period  Weeks    Status  Achieved        PT Long Term Goals - 05/07/17 1803      PT LONG TERM GOAL #1   Title  Patient will be independent in home exercise program to improve strength/mobility for better functional independence with ADLs.    Time  6    Period  Weeks    Status  Achieved      PT LONG TERM GOAL #2   Title  Patient will report a worst pain of 3/10 on VAS in   RUE          to improve tolerance with ADLs and reduced symptoms with activities.     Time  6    Period  Weeks    Status  Achieved      PT LONG TERM GOAL #3   Title  Patient will exhibit RUE shoulder AROM WFL without increase in pain in order to exhibit better  tolerance with ADLs including showering, dressing,  etc;     Time  6    Period  Weeks    Status  Achieved      PT LONG TERM GOAL #4   Title  patient will reduce quick DASH score to <20 to exhibit improved tolerance with ADLs and work tasks.     Time  6    Period  Weeks    Status  Achieved      PT LONG TERM GOAL #5   Title  patient will exhibit improved cervical AROM including: lateral flexion >30 degrees and rotation >55 degrees bilaterally to exhibit improved ROM for turning head when driving or doing other ADLs.     Time  6    Period  Weeks    Status  Achieved            Plan - 05/07/17 1824    Clinical Impression Statement  Patient is doing well; She exhibits improved cervical ROM and shoulder ROM without pain. Patient also reports less pain with ADLs as per [employer]. She has met all goals and is appropriate for discharge at this time. patient is agreeable; Advanced HEP with standing red tband shoulder ER for better shoulder strengthening; Patient will be discharged from therapy today;     Rehab Potential  Good    Clinical Impairments Affecting Rehab Potential  chronic condition;     PT Frequency  2x / week    PT Duration  6 weeks    PT Treatment/Interventions  Cryotherapy;Electrical Stimulation;Moist Heat;Traction;Ultrasound;Neuromuscular re-education;Therapeutic exercise;Therapeutic activities;Functional mobility training;Patient/family education;Manual techniques;Passive range of motion;Dry needling;Energy conservation;Taping    PT Home Exercise Plan  continue as given;     Consulted and Agree with Plan of Care  Patient       Patient will benefit from skilled therapeutic intervention in order to improve the following deficits and impairments:  Hypomobility, Impaired sensation, Decreased activity tolerance, Increased fascial restricitons, Impaired UE functional use, Pain, Decreased range of motion, Improper body mechanics, Postural dysfunction, Impaired flexibility  Visit Diagnosis: Cervicalgia  Pain in right  arm  Abnormal posture     Problem List Patient Active Problem List   Diagnosis Date Noted  . S/P vaginal hysterectomy 09/29/2016  . Fibroids 08/14/2016  . History of Helicobacter pylori infection 08/08/2016  . Microcytosis 08/01/2016  . Heel spur, left 04/07/2016  . Bilateral foot pain 04/04/2016  . Obesity 04/04/2016  . Anxiety 04/04/2016  . Hypertension, goal below 140/90 07/24/2015  . Cough 07/24/2015  . Bilateral hip pain 11/17/2014  . Atopic dermatitis 11/17/2014  . Low serum vitamin D 11/17/2014  . Pre-diabetes 11/17/2014  . Depression with anxiety 11/17/2014  . Mild persistent asthma with allergic rhinitis without complication 40/98/1191    Trotter,Margaret PT, DPT 05/07/2017, 6:25 PM  [place] MAIN [facility] SERVICES [address] [place], [place], [address] Phone: [phone]   Fax:  [phone]  Name: Alisha Kim MRN: [id number] Date of Birth: 05/18/1971

## 2017-05-07 NOTE — Patient Instructions (Addendum)
Strengthening: Resisted External Rotation    Hold red in right hand, elbow at side and forearm across body. Rotate forearm out. Repeat _10___ times per set. Do _2___ sets per session. Do __2__ sessions per day.  http://orth.exer.us/829   Copyright  VHI. All rights reserved.

## 2017-05-14 ENCOUNTER — Ambulatory Visit: Payer: 59 | Admitting: Physical Therapy

## 2017-05-19 ENCOUNTER — Encounter: Payer: 59 | Admitting: Physical Therapy

## 2017-05-21 ENCOUNTER — Encounter: Payer: 59 | Admitting: Physical Therapy

## 2017-05-26 ENCOUNTER — Encounter: Payer: 59 | Admitting: Physical Therapy

## 2017-05-28 ENCOUNTER — Encounter: Payer: 59 | Admitting: Physical Therapy

## 2017-05-30 ENCOUNTER — Encounter: Payer: Self-pay | Admitting: Family Medicine

## 2017-06-14 ENCOUNTER — Encounter: Payer: Self-pay | Admitting: Physical Therapy

## 2017-07-03 ENCOUNTER — Encounter: Payer: Self-pay | Admitting: *Deleted

## 2017-07-03 ENCOUNTER — Other Ambulatory Visit: Payer: Self-pay

## 2017-07-03 ENCOUNTER — Ambulatory Visit
Admission: EM | Admit: 2017-07-03 | Discharge: 2017-07-03 | Disposition: A | Discharge status: Home / Self Care | Payer: Managed Care, Other (non HMO) | Attending: Emergency Medicine | Admitting: Emergency Medicine

## 2017-07-03 DIAGNOSIS — H6981 Other specified disorders of Eustachian tube, right ear: Secondary | ICD-10-CM | POA: Diagnosis not present

## 2017-07-03 MED ORDER — FLUTICASONE PROPIONATE 50 MCG/ACT NA SUSP: 2.0000 | Freq: Every day | NASAL | 0 refills | Status: DC

## 2017-07-03 NOTE — ED Triage Notes (Signed)
Patient started having right ear pain 4 days ago and nausea started 2 days ago.

## 2017-07-03 NOTE — ED Provider Notes (Signed)
HPI  SUBJECTIVE:  Alisha Kim is a 47 y.o. female who presents with 1 week of right ear pressure, fullness.  She states that this is constant.  She denies ear pain.  She reports tinnitus and nausea in the morning.  She has tried Vicks VapoRub and a warm heating pad with some improvement in her symptoms.  No aggravating factors.  She denies vomiting, fevers, facial rash, change in her hearing, otorrhea.  She had a URI with nasal congestion earlier this week but this has largely resolved.  No dental pain.  No recent swimming.  She does not wear ear buds.  No foreign body insertion into her ear.  The ear pressure is not associated with chewing or yawning.  No antibiotics in the past month.  No antipyretic in the past 6-8 hours.  She has a past medical history of spinal stenosis, hypertension, sinusitis, asthma.  No history of recurrent otitis media, HIV, TMJ disorder.  LMP: Status post hysterectomy.  PMD: Arnetha Courser, MD   Past Medical History:  Diagnosis Date  . Adenomyosis 09/2015  . Allergy   . Anemia    history of  . Anxiety    controlled;   . Asthma   . Diabetes mellitus without complication ([place])    was told she was pre-diabetic  . Fibroids 08/14/2016   [place] March 2018  . History of Helicobacter pylori infection 08/08/2016   2004  . 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 Hypertension    controlled with medication;     Past Surgical History:  Procedure Laterality Date  . ABDOMINAL HYSTERECTOMY    . DENTAL SURGERY    . NO PAST SURGERIES    . TONSILLECTOMY    . VAGINAL HYSTERECTOMY Bilateral 09/29/2016   Procedure: HYSTERECTOMY VAGINAL WITH BILATERAL SALPINGECTOMY;  Surgeon: [name], MD;  Location: [facility] ORS;  Service: Gynecology;  Laterality: Bilateral;    Family History  Problem Relation Age of Onset  . Alcohol abuse Mother   . Arthritis Mother   . Asthma Mother   . Depression Mother   . Drug abuse Mother   . Hypertension Mother   . Mental illness Mother   . Cancer  Mother        breast and lung  . Alcohol abuse Father   . Drug abuse Father   . Cancer Father        lung  . Hypertension Brother   . Hearing loss Maternal Aunt   . Hypertension Maternal Aunt   . Stroke Maternal Aunt   . Alzheimer's disease Maternal Aunt   . Cancer Maternal Grandmother        cervical  . Heart disease Maternal Grandmother   . Hypertension Maternal Grandmother     Social History   Tobacco Use  . Smoking status: Never Smoker  . Smokeless tobacco: Never Used  Substance Use Topics  . Alcohol use: Yes    Alcohol/week: 0.0 oz    Comment: seldom use  . Drug use: No    No current facility-administered medications for this encounter.   Current Outpatient Medications:  .  albuterol (PROAIR HFA) 108 (90 Base) MCG/ACT inhaler, Inhale 1-2 puffs into the lungs every 6 (six) hours as needed for wheezing or shortness of breath., Disp: 18 g, Rfl: 5 .  amLODipine (NORVASC) 5 MG tablet, Take 1 tablet (5 mg total) by mouth every evening., Disp: 90 tablet, Rfl: 3 .  cyclobenzaprine (FLEXERIL) 10 MG tablet, Take 1 tablet (10  mg total) by mouth at bedtime., Disp: 30 tablet, Rfl: 1 .  ibuprofen (ADVIL,MOTRIN) 600 MG tablet, Take 1 tablet (600 mg total) by mouth every 6 (six) hours as needed (mild pain)., Disp: 60 tablet, Rfl: 1 .  fluticasone (FLONASE) 50 MCG/ACT nasal spray, Place 2 sprays into both nostrils daily., Disp: 16 g, Rfl: 0 .  Fluticasone-Salmeterol (ADVAIR DISKUS) 250-50 MCG/DOSE AEPB, Inhale 1 puff into the lungs 2 (two) times daily., Disp: 60 each, Rfl: 5 .  hydrOXYzine (ATARAX/VISTARIL) 25 MG tablet, Take 1 tablet (25 mg total) by mouth every 8 (eight) hours as needed for itching., Disp: 30 tablet, Rfl: 2  Allergies  Allergen Reactions  . Meloxicam Other (See Comments)    Felt horrible, headache, bloating, back pain, SHOB; no rash  . Zithromax [Azithromycin] Swelling    Tongue   . Pseudoephedrine-Guaifenesin Swelling    Of tongue  . Shellfish Allergy Rash  .  Shellfish-Derived Products Rash     ROS  As noted in HPI.   Physical Exam  BP (!) 160/95 (BP Location: Left Arm)   Pulse 90   Temp 98.2 F (36.8 C) (Oral)   Resp 18   Ht 5\' 6"  (1.676 m)   Wt 225 lb (102.1 kg)   LMP 09/29/2016   SpO2 100%   BMI 36.32 kg/m   Constitutional: Well developed, well nourished, no acute distress Eyes:  EOMI, conjunctiva normal bilaterally HENT: Normocephalic, atraumatic,mucus membranes moist Left TM normal. Right external ear normal.  No tenderness over the mastoid.  No pain with traction on the pinna or palpation of the tragus.  External ear canal normal.  No swelling, wax or foreign body seen. right TM normal, no air-fluid levels or effusion seen.  It is not red, dull, bulging.  No Crepitus or tenderness over the TMJ bilaterally. Lymph: No periauricular or cervical lymphadenopathy Respiratory: Normal inspiratory effort Cardiovascular: Normal rate GI: nondistended skin: No facial rash, skin intact Musculoskeletal: no deformities Neurologic: Alert & oriented x 3, no focal neuro deficits Psychiatric: Speech and behavior appropriate   ED Course   Medications - No data to display  Orders Placed This Encounter  Procedures  . Ambulatory referral to ENT    Referral Priority:   Routine    Referral Type:   Consultation    Referral Reason:   Specialty Services Required    Requested Specialty:   Otolaryngology    Number of Visits Requested:   1    No results found for this or any previous visit (from the past 24 hour(s)). No results found.  ED Clinical Impression  Dysfunction of right eustachian tube   ED Assessment/Plan  Presentation suggestive of eustachian tube dysfunction particularly since she had an upper respiratory infection earlier this week.  This has largely resolved.  We will have her try an antihistamine of her choice such as Claritin, Allegra, Zyrtec and have her start some Flonase.  Will provide a referral to ENT if she is  not better in 1 week.  Dr. Tami Ribas on call.  Discussed  MDM, plan and followup with patient. . patient agrees with plan.   Meds ordered this encounter  Medications  . fluticasone (FLONASE) 50 MCG/ACT nasal spray    Sig: Place 2 sprays into both nostrils daily.    Dispense:  16 g    Refill:  0    *This clinic note was created using [employer]. Therefore, there may be occasional mistakes despite careful proofreading.   ?  Melynda Ripple, MD 07/03/17 [phone]

## 2017-07-03 NOTE — Discharge Instructions (Signed)
Try an antihistamine of her choice such as Claritin, Allegra, or Zyrtec.  Try the Flonase as well.  Follow-up with Dr. Tami Ribas in a week if you are not better with this.

## 2017-07-06 ENCOUNTER — Telehealth: Payer: Self-pay | Admitting: *Deleted

## 2017-07-06 NOTE — Telephone Encounter (Signed)
Called patient, verified DOB, patient reported feeling better with some resolution of symptoms. Advised patient to follow up with PCP or [facility] if symptoms persist or become worse.

## 2017-07-14 MED FILL — AMLODIPINE BESYLATE 5 MG TA: 5 | 90 days supply | Qty: 90 | Fill #0

## 2017-07-23 ENCOUNTER — Ambulatory Visit: Payer: Self-pay | Admitting: Family Medicine

## 2017-08-21 ENCOUNTER — Telehealth: Payer: Self-pay | Admitting: Family Medicine

## 2017-08-21 NOTE — Telephone Encounter (Signed)
Copied from [place]. Topic: Quick Communication - See Telephone Encounter >> Aug 21, 2017  3:34 PM Vernona Rieger wrote: CRM for notification. See Telephone encounter for:   08/21/17.    Pt said she received a couple weeks ago that she needed to call and sch labs before her 3/22 appt. No orders in. Please call patient to sch [phone] and leave a message

## 2017-08-21 NOTE — Telephone Encounter (Signed)
Pt notified unfortunately I do not see a note about her coming in before her appt for labs or any labs ordered.  So we will just do any labs needed at her appt

## 2017-08-28 ENCOUNTER — Encounter: Payer: Self-pay | Admitting: Family Medicine

## 2017-08-28 ENCOUNTER — Ambulatory Visit (INDEPENDENT_AMBULATORY_CARE_PROVIDER_SITE_OTHER): Payer: No Typology Code available for payment source | Admitting: Family Medicine

## 2017-08-28 VITALS — BP 126/80 | HR 85 | Temp 98.3°F | Resp 14 | Ht 66.0 in | Wt 228.7 lb

## 2017-08-28 DIAGNOSIS — Z6836 Body mass index (BMI) 36.0-36.9, adult: Secondary | ICD-10-CM

## 2017-08-28 DIAGNOSIS — I10 Essential (primary) hypertension: Secondary | ICD-10-CM | POA: Diagnosis not present

## 2017-08-28 DIAGNOSIS — E786 Lipoprotein deficiency: Secondary | ICD-10-CM | POA: Diagnosis not present

## 2017-08-28 DIAGNOSIS — F410 Panic disorder [episodic paroxysmal anxiety] without agoraphobia: Secondary | ICD-10-CM

## 2017-08-28 DIAGNOSIS — Z1231 Encounter for screening mammogram for malignant neoplasm of breast: Secondary | ICD-10-CM

## 2017-08-28 DIAGNOSIS — E6609 Other obesity due to excess calories: Secondary | ICD-10-CM

## 2017-08-28 DIAGNOSIS — M4802 Spinal stenosis, cervical region: Secondary | ICD-10-CM

## 2017-08-28 DIAGNOSIS — R7303 Prediabetes: Secondary | ICD-10-CM | POA: Diagnosis not present

## 2017-08-28 DIAGNOSIS — Z1239 Encounter for other screening for malignant neoplasm of breast: Secondary | ICD-10-CM

## 2017-08-28 MED ORDER — GABAPENTIN 300 MG PO CAPS: 300.0000 mg | ORAL_CAPSULE | Freq: Every day | ORAL | 0 refills | Status: DC

## 2017-08-28 MED ORDER — DULOXETINE HCL 30 MG PO CPEP: 30.0000 mg | ORAL_CAPSULE | Freq: Every day | ORAL | 0 refills | Status: DC

## 2017-08-28 NOTE — Assessment & Plan Note (Signed)
Refer to another specialist, [facility]

## 2017-08-28 NOTE — Assessment & Plan Note (Signed)
Controlled today; try to limit sodium

## 2017-08-28 NOTE — Assessment & Plan Note (Signed)
Offered nutritionist referral; patient politely declined; she believes getting the stenosis fixed will help

## 2017-08-28 NOTE — Progress Notes (Signed)
BP 126/80   Pulse 85   Temp 98.3 F (36.8 C) (Oral)   Resp 14   Ht 5\' 6"  (1.676 m)   Wt 228 lb 11.2 oz (103.7 kg)   LMP 09/29/2016   SpO2 97%   BMI 36.91 kg/m    Subjective:    Patient ID: Alisha Kim, female    DOB: 25-Feb-1971, 47 y.o.   MRN: [id number]  HPI: Alisha Kim is a 47 y.o. female  Chief Complaint  Patient presents with  . Anxiety    increased panic attacks  . Spinal Stenosis    has done PT  . Hyperglycemia    she has been in pre diabetic range and mother has just been dx and she would like to be tested again  . URI    onset 1 weeks symptoms include coughing up yellow plem, congestion, and runny nose    HPI  Patient is here for labs Patient is not fasting Mother diagnosed with diabetes; she is the only family members Does not drink enough water No blurred vision; drinks more at night; 3x nocturia Snores, but no has ever said she stopped breathing; feels refreshed some times  Lumbar stenosis; did PT in April; they were supposed to keep it open; they closed her case and has to have a new referral; now she is wondering if she should do PT or do injections, if those don't work, then consider surgery Her back specialist was Cari Caraway, but she is considering a doctor at [facility]; Dr. Darreld Mclean Sometimes hard to lift her arms over her head; two fingers will go numb; right hand shakes more; pain shoots from right neck and down into the arm and pain and swelling in the right leg; causes her to get off balance PT did help with her arm; but she did not tell them the pain was in her leg too; cannot lay on the right side No one in the family that she knows; symptoms did not come on until after the hysterectomy; she called her GYN For pain control, she is using ibuprofen; has had to use urgent care services several times in [place]; she still has the 600 mg ibuprofen from previously The shaking is starting to become noticeable, famiy and friends are noticing  She is  having anxiety; worried about the stenosis the most, the pain and what to do next Not sure if it's coming from so much pain from the stenosis; when it's bad, it's bad Her biggest concern right now is the stenosis  Coughing up yellow phlegm; no fever; sinus drainage, nasal , all in the sinuses, no pain; sore throat, just irritated  Some weigh tgain; moving bowels okay  She was having anxiety attacks, alprazolam; she is not on an SSRI or SNRI  Depression screen [facility] 2/9 08/28/2017 03/02/2017 08/01/2016 06/06/2016 04/04/2016  Decreased Interest 0 0 0 0 0  Down, Depressed, Hopeless 0 0 0 0 0  PHQ - 2 Score 0 0 0 0 0    Relevant past medical, surgical, family and social history reviewed Past Medical History:  Diagnosis Date  . Adenomyosis 09/2015  . Allergy   . Anemia    history of  . Anxiety    controlled;   . Asthma   . Diabetes mellitus without complication ([place])    was told she was pre-diabetic  . Fibroids 08/14/2016   [place] March 2018  . History of Helicobacter pylori infection 08/08/2016   2004  . Hydrosalpinx  08/14/2016   right  . Hypertension    controlled with medication;    Past Surgical History:  Procedure Laterality Date  . ABDOMINAL HYSTERECTOMY    . DENTAL SURGERY    . NO PAST SURGERIES    . TONSILLECTOMY    . VAGINAL HYSTERECTOMY Bilateral 09/29/2016   Procedure: HYSTERECTOMY VAGINAL WITH BILATERAL SALPINGECTOMY;  Surgeon: [name], MD;  Location: [facility] ORS;  Service: Gynecology;  Laterality: Bilateral;   Family History  Problem Relation Age of Onset  . Alcohol abuse Mother   . Arthritis Mother   . Asthma Mother   . Depression Mother   . Drug abuse Mother   . Hypertension Mother   . Mental illness Mother   . Cancer Mother        breast and lung  . Diabetes Mother   . Alcohol abuse Father   . Drug abuse Father   . Cancer Father        lung  . Hypertension Brother   . Hearing loss Maternal Aunt   . Hypertension Maternal Aunt   . Stroke Maternal Aunt   .  Alzheimer's disease Maternal Aunt   . Cancer Maternal Grandmother        cervical  . Heart disease Maternal Grandmother   . Hypertension Maternal Grandmother    Social History   Tobacco Use  . Smoking status: Never Smoker  . Smokeless tobacco: Never Used  Substance Use Topics  . Alcohol use: Yes    Alcohol/week: 0.0 oz    Comment: seldom use  . Drug use: No    Interim medical history since last visit reviewed. Allergies and medications reviewed  Review of Systems Per HPI unless specifically indicated above     Objective:    BP 126/80   Pulse 85   Temp 98.3 F (36.8 C) (Oral)   Resp 14   Ht 5\' 6"  (1.676 m)   Wt 228 lb 11.2 oz (103.7 kg)   LMP 09/29/2016   SpO2 97%   BMI 36.91 kg/m   Wt Readings from Last 3 Encounters:  08/28/17 228 lb 11.2 oz (103.7 kg)  07/03/17 225 lb (102.1 kg)  03/02/17 224 lb 4.8 oz (101.7 kg)    Physical Exam  Constitutional: She appears well-developed and well-nourished. No distress.  HENT:  Head: Normocephalic and atraumatic.  Eyes: EOM are normal. No scleral icterus.  Neck: No thyromegaly present.  Cardiovascular: Normal rate, regular rhythm and normal heart sounds.  No murmur heard. Pulmonary/Chest: Effort normal and breath sounds normal. No respiratory distress. She has no wheezes.  Abdominal: Soft. Bowel sounds are normal. She exhibits no distension.  Musculoskeletal: Normal range of motion. She exhibits no edema.  Neurological: She is alert. She displays no tremor. She exhibits normal muscle tone.  LE strength 5/5  Skin: Skin is warm and dry. She is not diaphoretic. No pallor.  Psychiatric: She has a normal mood and affect. Her behavior is normal. Judgment and thought content normal.       Assessment & Plan:   Problem List Items Addressed This Visit      Cardiovascular and Mediastinum   Hypertension, goal below 140/90 (Chronic)    Controlled today; try to limit sodium      Relevant Orders   COMPLETE METABOLIC PANEL  WITH GFR (Completed)     Other   Pre-diabetes (Chronic)    Check A1c; glucose not fasting      Relevant Orders   COMPLETE METABOLIC PANEL WITH GFR (  Completed)   Hemoglobin A1c (Completed)   Lipid panel (Completed)   Obesity (Chronic)    Offered nutritionist referral; patient politely declined; she believes getting the stenosis fixed will help      Cervical stenosis of spinal canal - Primary    Refer to another specialist, [facility]      Relevant Orders   Ambulatory referral to Neurosurgery    Other Visit Diagnoses    Screening for breast cancer       Relevant Orders   MM Digital Screening   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 Sanda Klein.  An after-visit summary was printed and given to the patient at [place].  Please see the patient instructions which may contain other information and recommendations beyond what is mentioned above in the assessment and plan.  Meds ordered this encounter  Medications  . gabapentin (NEURONTIN) 300 MG capsule    Sig: Take 1 capsule (300 mg total) by mouth at bedtime.    Dispense:  30 capsule    Refill:  0  . DULoxetine (CYMBALTA) 30 MG capsule    Sig: Take 1 capsule (30 mg total) by mouth daily. For 10 days, then go to two pills daily    Dispense:  50 capsule    Refill:  0    Orders Placed This Encounter  Procedures  . MM Digital Screening  . COMPLETE METABOLIC PANEL WITH GFR  . Hemoglobin A1c  . Lipid panel  . Ambulatory referral to Neurosurgery

## 2017-08-28 NOTE — Patient Instructions (Signed)
Plain Mucinex or Mucinex DM (but not the "D") Try vitamin C (orange juice if not diabetic or vitamin C tablets) and drink green tea to help your immune system during your illness Get plenty of rest and hydration We'll have you see the specialist at [facility] the gabapentin at night, let me know of any issues Check out the information at familydoctor.org entitled "Nutrition for Weight Loss: What You Need to Know about Fad Diets" Try to lose between 1-2 pounds per week by taking in fewer calories and burning off more calories You can succeed by limiting portions, limiting foods dense in calories and fat, becoming more active, and drinking 8 glasses of water a day (64 ounces) Don't skip meals, especially breakfast, as skipping meals may alter your metabolism Do not use over-the-counter weight loss pills or gimmicks that claim rapid weight loss A healthy BMI (or body mass index) is between 18.5 and 24.9 You can calculate your ideal BMI at the Excel website [employer]fr

## 2017-08-28 NOTE — Assessment & Plan Note (Signed)
Check A1c; glucose not fasting

## 2017-08-29 LAB — HEMOGLOBIN A1C
HEMOGLOBIN A1C: 6 %{Hb} — AB (ref <5.7)
Mean Plasma Glucose: 126 (calc)
eAG (mmol/L): 7 (calc)

## 2017-08-29 LAB — COMPLETE METABOLIC PANEL WITH GFR
AG Ratio: 1 (calc) (ref 1.0–2.5)
ALBUMIN MSPROF: 3.5 g/dL — AB (ref 3.6–5.1)
ALKALINE PHOSPHATASE (APISO): 116 U/L — AB (ref 33–115)
ALT: 10 U/L (ref 6–29)
AST: 12 U/L (ref 10–35)
BUN: 8 mg/dL (ref 7–25)
CALCIUM: 8.8 mg/dL (ref 8.6–10.2)
CO2: 30 mmol/L (ref 20–32)
CREATININE: 0.72 mg/dL (ref 0.50–1.10)
Chloride: 104 mmol/L (ref 98–110)
GFR, EST NON AFRICAN AMERICAN: 100 mL/min/{1.73_m2} (ref >=60)
GFR, Est African American: 116 mL/min/{1.73_m2} (ref >=60)
GLOBULIN: 3.4 g/dL (ref 1.9–3.7)
GLUCOSE: 86 mg/dL (ref 65–139)
Potassium: 3.8 mmol/L (ref 3.5–5.3)
SODIUM: 138 mmol/L (ref 135–146)
TOTAL PROTEIN: 6.9 g/dL (ref 6.1–8.1)
Total Bilirubin: 0.2 mg/dL (ref 0.2–1.2)

## 2017-08-29 LAB — LIPID PANEL
CHOL/HDL RATIO: 4 (calc) (ref <5.0)
Cholesterol: 145 mg/dL (ref <200)
HDL: 36 mg/dL — AB (ref >50)
LDL Cholesterol (Calc): 94 mg/dL (calc)
Non-HDL Cholesterol (Calc): 109 mg/dL (calc) (ref <130)
Triglycerides: 61 mg/dL (ref <150)

## 2017-09-14 ENCOUNTER — Other Ambulatory Visit: Payer: Self-pay

## 2017-09-14 ENCOUNTER — Ambulatory Visit
Admission: EM | Admit: 2017-09-14 | Discharge: 2017-09-14 | Disposition: A | Discharge status: Home / Self Care | Payer: No Typology Code available for payment source | Attending: Family Medicine | Admitting: Family Medicine

## 2017-09-14 DIAGNOSIS — J0100 Acute maxillary sinusitis, unspecified: Secondary | ICD-10-CM | POA: Diagnosis not present

## 2017-09-14 DIAGNOSIS — J45901 Unspecified asthma with (acute) exacerbation: Secondary | ICD-10-CM

## 2017-09-14 MED ORDER — DOXYCYCLINE HYCLATE 100 MG PO CAPS: 100.0000 mg | ORAL_CAPSULE | Freq: Two times a day (BID) | ORAL | 0 refills | Status: DC

## 2017-09-14 MED ORDER — PREDNISONE 10 MG PO TABS: ORAL_TABLET | ORAL | 0 refills | Status: DC

## 2017-09-14 NOTE — Discharge Instructions (Addendum)
Take medication as prescribed. Rest. Drink plenty of fluids.  ° °Follow up with your primary care physician this week as needed. Return to Urgent care for new or worsening concerns.  ° °

## 2017-09-14 NOTE — ED Provider Notes (Signed)
[facility] ____________________________________________  Time seen: Approximately 1:50 PM  I have reviewed the triage vital signs and the nursing notes.   HISTORY  Chief Complaint Cough   HPI Alisha Kim is a 47 y.o. female presented for evaluation of 2 weeks of runny nose, nasal congestion, cough.  Patient reports over the last 1 week she has had intermittent wheezing as well.  Reports history of asthma with similar presentation.  States she does have some accompanying shortness of breath that is present but only with wheezing.  Denies any other shortness of breath.  Denies associated chest pain or hemoptysis.  Reports having very thick green yellowish nasal drainage with some sinus pressure sensation.  Denies known fevers.  States initially thought that it was a virus versus seasonal allergies.  States unresolved with multiple over-the-counter cough and congestion medication.  Has occasionally used her inhaler which does briefly help some.  Denies other aggravating or alleviating factors.  Reports otherwise feels well.  Denies abdominal pain, dysuria, extremity pain, extremity swelling or rash. Denies recent sickness. Denies recent antibiotic use.   Arnetha Courser, MD: PCP Patient's last menstrual period was 09/29/2016.   Past Medical History:  Diagnosis Date  . Adenomyosis 09/2015  . Allergy   . Anemia    history of  . Anxiety    controlled;   . Asthma   . Diabetes mellitus without complication ([place])    was told she was pre-diabetic  . Fibroids 08/14/2016   [place] March 2018  . History of Helicobacter pylori infection 08/08/2016   2004  . 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 Hypertension    controlled with medication;     Patient Active Problem List   Diagnosis Date Noted  . Cervical stenosis of spinal canal 08/28/2017  . S/P vaginal hysterectomy 09/29/2016  . Fibroids 08/14/2016  . History of Helicobacter pylori infection 08/08/2016  . Microcytosis  08/01/2016  . Heel spur, left 04/07/2016  . Bilateral foot pain 04/04/2016  . Obesity 04/04/2016  . Anxiety 04/04/2016  . Hypertension, goal below 140/90 07/24/2015  . Cough 07/24/2015  . Bilateral hip pain 11/17/2014  . Atopic dermatitis 11/17/2014  . Low serum vitamin D 11/17/2014  . Pre-diabetes 11/17/2014  . Depression with anxiety 11/17/2014  . Mild persistent asthma with allergic rhinitis without complication 04/02/8526    Past Surgical History:  Procedure Laterality Date  . ABDOMINAL HYSTERECTOMY    . DENTAL SURGERY    . NO PAST SURGERIES    . TONSILLECTOMY    . VAGINAL HYSTERECTOMY Bilateral 09/29/2016   Procedure: HYSTERECTOMY VAGINAL WITH BILATERAL SALPINGECTOMY;  Surgeon: [name], MD;  Location: [facility] ORS;  Service: Gynecology;  Laterality: Bilateral;     No current facility-administered medications for this encounter.   Current Outpatient Medications:  .  albuterol (PROAIR HFA) 108 (90 Base) MCG/ACT inhaler, Inhale 1-2 puffs into the lungs every 6 (six) hours as needed for wheezing or shortness of breath., Disp: 18 g, Rfl: 5 .  amLODipine (NORVASC) 5 MG tablet, Take 1 tablet (5 mg total) by mouth every evening., Disp: 90 tablet, Rfl: 3 .  DULoxetine (CYMBALTA) 30 MG capsule, Take 1 capsule (30 mg total) by mouth daily. For 10 days, then go to two pills daily, Disp: 50 capsule, Rfl: 0 .  gabapentin (NEURONTIN) 300 MG capsule, Take 1 capsule (300 mg total) by mouth at bedtime., Disp: 30 capsule, Rfl: 0 .  hydrOXYzine (ATARAX/VISTARIL) 25 MG tablet, Take 1 tablet (25 mg  total) by mouth every 8 (eight) hours as needed for itching., Disp: 30 tablet, Rfl: 2 .  ibuprofen (ADVIL,MOTRIN) 600 MG tablet, Take 1 tablet (600 mg total) by mouth every 6 (six) hours as needed (mild pain)., Disp: 60 tablet, Rfl: 1 .  doxycycline (VIBRAMYCIN) 100 MG capsule, Take 1 capsule (100 mg total) by mouth 2 (two) times daily., Disp: 20 capsule, Rfl: 0 .  predniSONE (DELTASONE) 10 MG tablet,  Start 60 mg po day one, then 50 mg po day two, taper by 10 mg daily until complete., Disp: 21 tablet, Rfl: 0  Allergies Meloxicam; Zithromax [azithromycin]; Pseudoephedrine-guaifenesin; Shellfish allergy; and Shellfish-derived products  Family History  Problem Relation Age of Onset  . Alcohol abuse Mother   . Arthritis Mother   . Asthma Mother   . Depression Mother   . Drug abuse Mother   . Hypertension Mother   . Mental illness Mother   . Cancer Mother        breast and lung  . Diabetes Mother   . Alcohol abuse Father   . Drug abuse Father   . Cancer Father        lung  . Hypertension Brother   . Hearing loss Maternal Aunt   . Hypertension Maternal Aunt   . Stroke Maternal Aunt   . Alzheimer's disease Maternal Aunt   . Cancer Maternal Grandmother        cervical  . Heart disease Maternal Grandmother   . Hypertension Maternal Grandmother     Social History Social History   Tobacco Use  . Smoking status: Never Smoker  . Smokeless tobacco: Never Used  Substance Use Topics  . Alcohol use: Yes    Alcohol/week: 0.0 oz    Comment: seldom use  . Drug use: No    Review of Systems Constitutional: No fever/chills Eyes: No visual changes. ENT: No sore throat. As above.  Cardiovascular: Denies chest pain. Respiratory: As above.  Gastrointestinal: No abdominal pain.  No nausea, no vomiting.  No diarrhea.  Genitourinary: Negative for dysuria. Musculoskeletal: Negative for back pain. Skin: Negative for rash.  ____________________________________________   PHYSICAL EXAM:  VITAL SIGNS: ED Triage Vitals  Enc Vitals Group     BP 09/14/17 1307 (!) 153/89     Pulse Rate 09/14/17 1307 98     Resp 09/14/17 1307 18     Temp 09/14/17 1307 98.2 F (36.8 C)     Temp Source 09/14/17 1307 Oral     SpO2 09/14/17 1307 98 %     Weight 09/14/17 1305 228 lb (103.4 kg)     Height 09/14/17 1305 5\' 6"  (1.676 m)     Head Circumference --      Peak Flow --      Pain Score 09/14/17  1304 0     Pain Loc --      Pain Edu? --      Excl. in [place]? --     Constitutional: Alert and oriented. Well appearing and in no acute distress. Eyes: Conjunctivae are normal.  Head: Atraumatic.Mildtenderness  to palpation bilateral maxillary sinuses.  No frontal sinus tenderness palpation.  No swelling. No erythema.   Ears: no erythema, normal TMs bilaterally.   Nose: nasal congestion with bilateral nasal turbinate erythema and edema.   Mouth/Throat: Mucous membranes are moist.  Oropharynx non-erythematous.No tonsillar swelling or exudate.  Neck: No stridor.  No cervical spine tenderness to palpation. Hematological/Lymphatic/Immunilogical: No cervical lymphadenopathy. Cardiovascular: Normal rate, regular rhythm. Grossly normal heart  sounds.  Good peripheral circulation. Respiratory: Normal respiratory effort.  No retractions. No wheezes, rales or rhonchi. Good air movement.  Dry intermittent cough noted with bronchospasm. Musculoskeletal: No lower extremity tenderness nor edema.  No cervical, thoracic or lumbar tenderness to palpation.  Neurologic:  Normal speech and language. No gait instability. Skin:  Skin is warm, dry and intact. No rash noted. Psychiatric: Mood and affect are normal. Speech and behavior are normal.  ___________________________________________   LABS (all labs ordered are listed, but only abnormal results are displayed)  Labs Reviewed - No data to display   PROCEDURES Procedures   INITIAL IMPRESSION / ASSESSMENT AND PLAN / ED COURSE  Pertinent labs & imaging results that were available during my care of the patient were reviewed by me and considered in my medical decision making (see chart for details).  Well-appearing patient.  No acute distress.  Suspect recent viral versus allergic symptoms with secondary sinusitis and asthma exacerbation.  Will treat patient with oral doxycycline, prednisone taper and continue home albuterol use.  Encourage rest, fluids,  supportive care. Discussed indication, risks and benefits of medications with patient.  Discussed follow up with Primary care physician this week. Discussed follow up and return parameters including no resolution or any worsening concerns. Patient verbalized understanding and agreed to plan.   ____________________________________________   FINAL CLINICAL IMPRESSION(S) / ED DIAGNOSES  Final diagnoses:  Acute maxillary sinusitis, recurrence not specified  Exacerbation of asthma, unspecified asthma severity, unspecified whether persistent     ED Discharge Orders        Ordered    predniSONE (DELTASONE) 10 MG tablet     09/14/17 1337    doxycycline (VIBRAMYCIN) 100 MG capsule  2 times daily     09/14/17 1337       Note: This dictation was prepared with Dragon dictation along with smaller phrase technology. Any transcriptional errors that result from this process are unintentional.         Marylene Land, NP 09/14/17 1353

## 2017-09-14 NOTE — ED Triage Notes (Signed)
Patient complains of cough, congestion, shortness of breath, wheezing x 2 weeks. Reports history of asthma and has been using inhalers.

## 2017-09-24 ENCOUNTER — Other Ambulatory Visit: Payer: Self-pay | Admitting: Family Medicine

## 2017-09-24 MED ORDER — DULOXETINE HCL 60 MG PO CPEP: 60.0000 mg | ORAL_CAPSULE | Freq: Every day | ORAL | 0 refills | Status: DC

## 2017-09-28 ENCOUNTER — Encounter: Payer: Self-pay | Admitting: Family Medicine

## 2017-09-28 ENCOUNTER — Ambulatory Visit (INDEPENDENT_AMBULATORY_CARE_PROVIDER_SITE_OTHER): Payer: No Typology Code available for payment source | Admitting: Family Medicine

## 2017-09-28 DIAGNOSIS — M25552 Pain in left hip: Secondary | ICD-10-CM

## 2017-09-28 DIAGNOSIS — I10 Essential (primary) hypertension: Secondary | ICD-10-CM

## 2017-09-28 DIAGNOSIS — E6609 Other obesity due to excess calories: Secondary | ICD-10-CM

## 2017-09-28 DIAGNOSIS — M25551 Pain in right hip: Secondary | ICD-10-CM | POA: Diagnosis not present

## 2017-09-28 DIAGNOSIS — Z6837 Body mass index (BMI) 37.0-37.9, adult: Secondary | ICD-10-CM | POA: Diagnosis not present

## 2017-09-28 MED ORDER — DULOXETINE HCL 60 MG PO CPEP: 60.0000 mg | ORAL_CAPSULE | Freq: Every day | ORAL | 3 refills | Status: DC

## 2017-09-28 NOTE — Progress Notes (Signed)
BP 138/72   Pulse 98   Temp 98.1 F (36.7 C) (Oral)   Resp 14   Ht 5\' 5"  (1.651 m)   Wt 226 lb 14.4 oz (102.9 kg)   LMP 09/29/2016   SpO2 97%   BMI 37.76 kg/m    Subjective:    Patient ID: Alisha Kim, female    DOB: [birth year], 47 y.o.   MRN: [id number]  HPI: LANDRY LOOKINGBILL is a 47 y.o. female  Chief Complaint  Patient presents with  . Follow-up    1 month cymbalta working well    HPI Patient is here for f/u She was seen on 08/28/17; cervical stenosis, anxiety Since then, she was also treated for acute sinusitis, urgent care; mild asthma attack, "but I'm good" and all better She told the CMA that she really likes the new medicine; helping her hip pain immensely She was started on cymbalta; she did not have to take the gabapentin She used to holler in her sleep from pain Sleeping better; pain is better; shaking is gone She is so thankful; in NO pain at all today; in zero pain Last BP was 126/80; today's is higher; had salty food yesterday (Easter) She has goals to lose weight; motivated and happy to move more and start to work on this  Depression screen [facility] 2/9 09/28/2017 08/28/2017 03/02/2017 08/01/2016 06/06/2016  Decreased Interest 0 0 0 0 0  Down, Depressed, Hopeless 0 0 0 0 0  PHQ - 2 Score 0 0 0 0 0    Relevant past medical, surgical, family and social history reviewed Past Medical History:  Diagnosis Date  . Adenomyosis 09/2015  . Allergy   . Anemia    history of  . Anxiety    controlled;   . Asthma   . Diabetes mellitus without complication ([place])    was told she was pre-diabetic  . Fibroids 08/14/2016   [place] March 2018  . History of Helicobacter pylori infection 08/08/2016   2004  . 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 Hypertension    controlled with medication;    Past Surgical History:  Procedure Laterality Date  . ABDOMINAL HYSTERECTOMY    . DENTAL SURGERY    . NO PAST SURGERIES    . TONSILLECTOMY    . VAGINAL HYSTERECTOMY Bilateral 09/29/2016   Procedure: HYSTERECTOMY VAGINAL WITH BILATERAL SALPINGECTOMY;  Surgeon: [name], MD;  Location: [facility] ORS;  Service: Gynecology;  Laterality: Bilateral;   Family History  Problem Relation Age of Onset  . Alcohol abuse Mother   . Arthritis Mother   . Asthma Mother   . Depression Mother   . Drug abuse Mother   . Hypertension Mother   . Mental illness Mother   . Cancer Mother        breast and lung  . Diabetes Mother   . Alcohol abuse Father   . Drug abuse Father   . Cancer Father        lung  . Hypertension Brother   . Hearing loss Maternal Aunt   . Hypertension Maternal Aunt   . Stroke Maternal Aunt   . Alzheimer's disease Maternal Aunt   . Cancer Maternal Grandmother        cervical  . Heart disease Maternal Grandmother   . Hypertension Maternal Grandmother    Social History   Tobacco Use  . Smoking status: Never Smoker  . Smokeless tobacco: Never Used  Substance Use Topics  . Alcohol use: Yes  Alcohol/week: 0.0 oz    Comment: seldom use  . Drug use: No    Interim medical history since last visit reviewed. Allergies and medications reviewed  Review of Systems Per HPI unless specifically indicated above     Objective:    BP 138/72   Pulse 98   Temp 98.1 F (36.7 C) (Oral)   Resp 14   Ht 5\' 5"  (1.651 m)   Wt 226 lb 14.4 oz (102.9 kg)   LMP 09/29/2016   SpO2 97%   BMI 37.76 kg/m   Wt Readings from Last 3 Encounters:  09/28/17 226 lb 14.4 oz (102.9 kg)  09/14/17 228 lb (103.4 kg)  08/28/17 228 lb 11.2 oz (103.7 kg)    Physical Exam  Constitutional: She appears well-developed and well-nourished.  HENT:  Mouth/Throat: Mucous membranes are normal.  Eyes: EOM are normal. No scleral icterus.  Cardiovascular: Normal rate and regular rhythm.  Pulmonary/Chest: Effort normal and breath sounds normal.  Neurological:  Grip strength 5/5; good flexibility (demonstrates with bringing right knee/foot up above waist to chest level across her body)    Psychiatric: She has a normal mood and affect. Her behavior is normal. Her mood appears not anxious. She does not exhibit a depressed mood.      Assessment & Plan:   Problem List Items Addressed This Visit      Cardiovascular and Mediastinum   Hypertension, goal below 140/90 (Chronic)    Improved on recheck today; patient will monitor BP at home and increase amlodipine to 10 mg daily if not consistently under 140/90; try DASH guidelines; expect this to get better if she loses weight and increases her activity level        Other   Obesity (Chronic)    Patient plans to really get to work on this now that she is feeling so much better      Bilateral hip pain    Improved on cymbalta          Follow up plan: Return in about 6 months (around 03/30/2018).  An after-visit summary was printed and given to the patient at [place].  Please see the patient instructions which may contain other information and recommendations beyond what is mentioned above in the assessment and plan.  Meds ordered this encounter  Medications  . DULoxetine (CYMBALTA) 60 MG capsule    Sig: Take 1 capsule (60 mg total) by mouth daily.    Dispense:  90 capsule    Refill:  3    No orders of the defined types were placed in this encounter.

## 2017-09-28 NOTE — Assessment & Plan Note (Signed)
Patient plans to really get to work on this now that she is feeling so much better

## 2017-09-28 NOTE — Assessment & Plan Note (Signed)
Improved on recheck today; patient will monitor BP at home and increase amlodipine to 10 mg daily if not consistently under 140/90; try DASH guidelines; expect this to get better if she loses weight and increases her activity level

## 2017-09-28 NOTE — Patient Instructions (Addendum)
Check blood pressure at home and then increase the amlodipine to 10 mg daily (max) if needed to keep BP under 140/90 Try to follow the DASH guidelines (DASH stands for Dietary Approaches to Stop Hypertension). Try to limit the sodium in your diet to no more than 1,500mg  of sodium per day. Certainly try to not exceed 2,000 mg per day at the very most. Do not add salt when cooking or at the table.  Check the sodium amount on labels when shopping, and choose items lower in sodium when given a choice. Avoid or limit foods that already contain a lot of sodium. Eat a diet rich in fruits and vegetables and whole grains, and try to lose weight if overweight or obese Check out the information at familydoctor.org entitled "Nutrition for Weight Loss: What You Need to Know about Fad Diets" Try to lose between 1-2 pounds per week by taking in fewer calories and burning off more calories You can succeed by limiting portions, limiting foods dense in calories and fat, becoming more active, and drinking 8 glasses of water a day (64 ounces) Don't skip meals, especially breakfast, as skipping meals may alter your metabolism Do not use over-the-counter weight loss pills or gimmicks that claim rapid weight loss A healthy BMI (or body mass index) is between 18.5 and 24.9 You can calculate your ideal BMI at the [place] website ClubMonetize.fr   DASH Eating Plan DASH stands for "Dietary Approaches to Stop Hypertension." The DASH eating plan is a healthy eating plan that has been shown to reduce high blood pressure (hypertension). It may also reduce your risk for type 2 diabetes, heart disease, and stroke. The DASH eating plan may also help with weight loss. What are tips for following this plan? General guidelines  Avoid eating more than 2,300 mg (milligrams) of salt (sodium) a day. If you have hypertension, you may need to reduce your sodium intake to 1,500 mg a day.  Limit  alcohol intake to no more than 1 drink a day for nonpregnant women and 2 drinks a day for men. One drink equals 12 oz of beer, 5 oz of wine, or 1 oz of hard liquor.  Work with your health care provider to maintain a healthy body weight or to lose weight. Ask what an ideal weight is for you.  Get at least 30 minutes of exercise that causes your heart to beat faster (aerobic exercise) most days of the week. Activities may include walking, swimming, or biking.  Work with your health care provider or diet and nutrition specialist (dietitian) to adjust your eating plan to your individual calorie needs. Reading food labels  Check food labels for the amount of sodium per serving. Choose foods with less than 5 percent of the Daily Value of sodium. Generally, foods with less than 300 mg of sodium per serving fit into this eating plan.  To find whole grains, look for the word "whole" as the first word in the ingredient list. Shopping  Buy products labeled as "low-sodium" or "no salt added."  Buy fresh foods. Avoid canned foods and premade or frozen meals. Cooking  Avoid adding salt when cooking. Use salt-free seasonings or herbs instead of table salt or sea salt. Check with your health care provider or pharmacist before using salt substitutes.  Do not fry foods. Cook foods using healthy methods such as baking, boiling, grilling, and broiling instead.  Cook with heart-healthy oils, such as olive, canola, soybean, or sunflower oil. Meal planning  Eat a balanced diet that includes: ? 5 or more servings of fruits and vegetables each day. At each meal, try to fill half of your plate with fruits and vegetables. ? Up to 6-8 servings of whole grains each day. ? Less than 6 oz of lean meat, poultry, or fish each day. A 3-oz serving of meat is about the same size as a deck of cards. One egg equals 1 oz. ? 2 servings of low-fat dairy each day. ? A serving of nuts, seeds, or beans 5 times each  week. ? Heart-healthy fats. Healthy fats called Omega-3 fatty acids are found in foods such as flaxseeds and coldwater fish, like sardines, salmon, and mackerel.  Limit how much you eat of the following: ? Canned or prepackaged foods. ? Food that is high in trans fat, such as fried foods. ? Food that is high in saturated fat, such as fatty meat. ? Sweets, desserts, sugary drinks, and other foods with added sugar. ? Full-fat dairy products.  Do not salt foods before eating.  Try to eat at least 2 vegetarian meals each week.  Eat more home-cooked food and less restaurant, buffet, and fast food.  When eating at a restaurant, ask that your food be prepared with less salt or no salt, if possible. What foods are recommended? The items listed may not be a complete list. Talk with your dietitian about what dietary choices are best for you. Grains Whole-grain or whole-wheat bread. Whole-grain or whole-wheat pasta. Brown rice. [place]. Bulgur. Whole-grain and low-sodium cereals. Pita bread. Low-fat, low-sodium crackers. Whole-wheat flour tortillas. Vegetables Fresh or frozen vegetables (raw, steamed, roasted, or grilled). Low-sodium or reduced-sodium tomato and vegetable juice. Low-sodium or reduced-sodium tomato sauce and tomato paste. Low-sodium or reduced-sodium canned vegetables. Fruits All fresh, dried, or frozen fruit. Canned fruit in natural juice (without added sugar). Meat and other protein foods Skinless chicken or [place]. Ground chicken or [place]. Pork with fat trimmed off. Fish and seafood. Egg whites. Dried beans, peas, or lentils. Unsalted nuts, nut butters, and seeds. Unsalted canned beans. Lean cuts of beef with fat trimmed off. Low-sodium, lean deli meat. Dairy Low-fat (1%) or fat-free (skim) milk. Fat-free, low-fat, or reduced-fat cheeses. Nonfat, low-sodium ricotta or cottage cheese. Low-fat or nonfat yogurt. Low-fat, low-sodium cheese. Fats and oils Soft margarine  without trans fats. Vegetable oil. Low-fat, reduced-fat, or light mayonnaise and salad dressings (reduced-sodium). Canola, safflower, olive, soybean, and sunflower oils. Avocado. Seasoning and other foods Herbs. Spices. Seasoning mixes without salt. Unsalted popcorn and pretzels. Fat-free sweets. What foods are not recommended? The items listed may not be a complete list. Talk with your dietitian about what dietary choices are best for you. Grains Baked goods made with fat, such as croissants, muffins, or some breads. Dry pasta or rice meal packs. Vegetables Creamed or fried vegetables. Vegetables in a cheese sauce. Regular canned vegetables (not low-sodium or reduced-sodium). Regular canned tomato sauce and paste (not low-sodium or reduced-sodium). Regular tomato and vegetable juice (not low-sodium or reduced-sodium). Angie Fava. Olives. Fruits Canned fruit in a light or heavy syrup. Fried fruit. Fruit in cream or butter sauce. Meat and other protein foods Fatty cuts of meat. Ribs. Fried meat. Berniece Salines. Sausage. Bologna and other processed lunch meats. Salami. Fatback. Hotdogs. Bratwurst. Salted nuts and seeds. Canned beans with added salt. Canned or smoked fish. Whole eggs or egg yolks. Chicken or [place] with skin. Dairy Whole or 2% milk, cream, and half-and-half. Whole or full-fat cream cheese. Whole-fat or sweetened yogurt.  Full-fat cheese. Nondairy creamers. Whipped toppings. Processed cheese and cheese spreads. Fats and oils Butter. Stick margarine. Lard. Shortening. Ghee. Bacon fat. Tropical oils, such as coconut, palm kernel, or palm oil. Seasoning and other foods Salted popcorn and pretzels. Onion salt, garlic salt, seasoned salt, table salt, and sea salt. Worcestershire sauce. Tartar sauce. Barbecue sauce. Teriyaki sauce. Soy sauce, including reduced-sodium. Steak sauce. Canned and packaged gravies. Fish sauce. Oyster sauce. Cocktail sauce. Horseradish that you find on the shelf. Ketchup.  Mustard. Meat flavorings and tenderizers. Bouillon cubes. Hot sauce and Tabasco sauce. Premade or packaged marinades. Premade or packaged taco seasonings. Relishes. Regular salad dressings. Where to find more information:  National Heart, Lung, and [place]: https://wilson-eaton.com/  American Heart Association: www.heart.org Summary  The DASH eating plan is a healthy eating plan that has been shown to reduce high blood pressure (hypertension). It may also reduce your risk for type 2 diabetes, heart disease, and stroke.  With the DASH eating plan, you should limit salt (sodium) intake to 2,300 mg a day. If you have hypertension, you may need to reduce your sodium intake to 1,500 mg a day.  When on the DASH eating plan, aim to eat more fresh fruits and vegetables, whole grains, lean proteins, low-fat dairy, and heart-healthy fats.  Work with your health care provider or diet and nutrition specialist (dietitian) to adjust your eating plan to your individual calorie needs. This information is not intended to replace advice given to you by your health care provider. Make sure you discuss any questions you have with your health care provider. Document Released: 05/15/2011 Document Revised: 05/19/2016 Document Reviewed: 05/19/2016 Elsevier Interactive Patient Education  [employer].

## 2017-09-28 NOTE — Assessment & Plan Note (Signed)
Improved on cymbalta

## 2017-10-27 MED FILL — AMLODIPINE BESYLATE 5 MG TA: 5 | 90 days supply | Qty: 90 | Fill #1 | Status: TO

## 2017-12-21 ENCOUNTER — Ambulatory Visit
Admission: RE | Admit: 2017-12-21 | Discharge: 2017-12-21 | Disposition: A | Discharge status: Home / Self Care | Payer: No Typology Code available for payment source | Source: Ambulatory Visit | Attending: Family Medicine | Admitting: Family Medicine

## 2017-12-21 DIAGNOSIS — Z1239 Encounter for other screening for malignant neoplasm of breast: Secondary | ICD-10-CM

## 2017-12-21 DIAGNOSIS — Z1231 Encounter for screening mammogram for malignant neoplasm of breast: Secondary | ICD-10-CM | POA: Insufficient documentation

## 2018-03-01 ENCOUNTER — Ambulatory Visit (INDEPENDENT_AMBULATORY_CARE_PROVIDER_SITE_OTHER): Payer: No Typology Code available for payment source | Admitting: Family Medicine

## 2018-03-01 ENCOUNTER — Encounter: Payer: Self-pay | Admitting: Family Medicine

## 2018-03-01 VITALS — BP 124/70 | HR 87 | Temp 98.5°F | Ht 65.0 in | Wt 241.2 lb

## 2018-03-01 DIAGNOSIS — R7989 Other specified abnormal findings of blood chemistry: Secondary | ICD-10-CM

## 2018-03-01 DIAGNOSIS — J309 Allergic rhinitis, unspecified: Secondary | ICD-10-CM | POA: Insufficient documentation

## 2018-03-01 DIAGNOSIS — I10 Essential (primary) hypertension: Secondary | ICD-10-CM | POA: Diagnosis not present

## 2018-03-01 DIAGNOSIS — E786 Lipoprotein deficiency: Secondary | ICD-10-CM

## 2018-03-01 DIAGNOSIS — Z23 Encounter for immunization: Secondary | ICD-10-CM | POA: Diagnosis not present

## 2018-03-01 DIAGNOSIS — Z5181 Encounter for therapeutic drug level monitoring: Secondary | ICD-10-CM

## 2018-03-01 DIAGNOSIS — M4802 Spinal stenosis, cervical region: Secondary | ICD-10-CM

## 2018-03-01 DIAGNOSIS — J4530 Mild persistent asthma, uncomplicated: Secondary | ICD-10-CM

## 2018-03-01 DIAGNOSIS — J302 Other seasonal allergic rhinitis: Secondary | ICD-10-CM

## 2018-03-01 DIAGNOSIS — R7303 Prediabetes: Secondary | ICD-10-CM

## 2018-03-01 DIAGNOSIS — R718 Other abnormality of red blood cells: Secondary | ICD-10-CM

## 2018-03-01 MED ORDER — ALBUTEROL SULFATE HFA 108 (90 BASE) MCG/ACT IN AERS: 1.0000 | INHALATION_SPRAY | RESPIRATORY_TRACT | 1 refills | Status: DC | PRN

## 2018-03-01 MED ORDER — AMLODIPINE BESYLATE 5 MG PO TABS: 5.0000 mg | ORAL_TABLET | Freq: Every evening | ORAL | 3 refills | Status: DC

## 2018-03-01 NOTE — Progress Notes (Signed)
BP 124/70   Pulse 87   Temp 98.5 F (36.9 C)   Ht 5\' 5"  (1.651 m)   Wt 241 lb 3.2 oz (109.4 kg)   LMP 09/29/2016 Comment: partial  SpO2 98%   BMI 40.14 kg/m    Subjective:    Patient ID: Alisha Kim, female    DOB: May 07, 1971, 48 y.o.   MRN: [id number]  HPI: Alisha Kim is a 47 y.o. female  Chief Complaint  Patient presents with  . Follow-up    HPI Patient is here for f/u  She has hypertension Controlled today; chronic issue; taking amlodipine for this; less salt; has NSAID in med list, but only uses PRN, hardly ever (explained it can raise BP)  Asthma; it got pretty bad last October; usually April is also a tough time; she has not had a pneumonia vaccine; getting flu shot today; allergic rhinitis; takes zyrtec PRN  Prediabetes; she is not fasting; drinks more water, not excessive dry mouth; no blurred vision; DM runs in the family (mother);  Lab Results  Component Value Date   HGBA1C 6.0 (H) 08/28/2017   Spinal stenosis; the cymbalta is really helping, "I have my life back"; pain is much better  Obesity; she knows she needs to lose weight; addicted to brownies and chocolate cake; craving more, changed since surgery (hysterectomy)  Low HDL; last level was 36 in March; not really doing any exercise; walks in the mall or around [place], [employer]  Low vitamin  D; taking supplement  Fall Risk  03/01/2018 09/28/2017 08/28/2017 03/02/2017 08/01/2016  Falls in the past year? No No No No No     Depression screen [facility] 2/9 03/01/2018 03/01/2018 09/28/2017 08/28/2017 03/02/2017  Decreased Interest 0 0 0 0 0  Down, Depressed, Hopeless 0 0 0 0 0  PHQ - 2 Score 0 0 0 0 0  Altered sleeping 0 - - - -  Tired, decreased energy 0 - - - -  Change in appetite 0 - - - -  Feeling bad or failure about yourself  0 - - - -  Trouble concentrating 0 - - - -  Moving slowly or fidgety/restless 0 - - - -  Suicidal thoughts 0 - - - -  PHQ-9 Score 0 - - - -    Relevant past  medical, surgical, family and social history reviewed Past Medical History:  Diagnosis Date  . Adenomyosis 09/2015  . Allergy   . Anemia    history of  . Anxiety    controlled;   . Asthma   . Diabetes mellitus without complication ([name])    was told she was pre-diabetic  . Fibroids 08/14/2016   [place] March 2018  . History of Helicobacter pylori infection 08/08/2016   2004  . 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 Hypertension    controlled with medication;    Past Surgical History:  Procedure Laterality Date  . ABDOMINAL HYSTERECTOMY    . DENTAL SURGERY    . NO PAST SURGERIES    . TONSILLECTOMY    . VAGINAL HYSTERECTOMY Bilateral 09/29/2016   Procedure: HYSTERECTOMY VAGINAL WITH BILATERAL SALPINGECTOMY;  Surgeon: [name], MD;  Location: [facility] ORS;  Service: Gynecology;  Laterality: Bilateral;   Family History  Problem Relation Age of Onset  . Alcohol abuse Mother   . Arthritis Mother   . Asthma Mother   . Depression Mother   . Drug abuse Mother   . Hypertension Mother   .  Mental illness Mother   . Cancer Mother        breast and lung  . Diabetes Mother   . Breast cancer Mother 67  . Alcohol abuse Father   . Drug abuse Father   . Cancer Father        lung  . Hypertension Brother   . Hearing loss Maternal Aunt   . Hypertension Maternal Aunt   . Stroke Maternal Aunt   . Alzheimer's disease Maternal Aunt   . Breast cancer Maternal Aunt        mat great aunt  . Cancer Maternal Grandmother        cervical  . Heart disease Maternal Grandmother   . Hypertension Maternal Grandmother    Social History   Tobacco Use  . Smoking status: Never Smoker  . Smokeless tobacco: Never Used  Substance Use Topics  . Alcohol use: Yes    Alcohol/week: 0.0 standard drinks    Comment: seldom use  . Drug use: No    Interim medical history since last visit reviewed. Allergies and medications reviewed  Review of Systems Per HPI unless specifically indicated above     Objective:      BP 124/70   Pulse 87   Temp 98.5 F (36.9 C)   Ht 5\' 5"  (1.651 m)   Wt 241 lb 3.2 oz (109.4 kg)   LMP 09/29/2016 Comment: partial  SpO2 98%   BMI 40.14 kg/m   Wt Readings from Last 3 Encounters:  03/01/18 241 lb 3.2 oz (109.4 kg)  09/28/17 226 lb 14.4 oz (102.9 kg)  09/14/17 228 lb (103.4 kg)    Physical Exam  Constitutional: She appears well-developed and well-nourished. No distress.  HENT:  Head: Normocephalic and atraumatic.  Eyes: EOM are normal. No scleral icterus.  Neck: No thyromegaly present.  Cardiovascular: Normal rate, regular rhythm and normal heart sounds.  No murmur heard. Pulmonary/Chest: Effort normal and breath sounds normal. No respiratory distress. She has no wheezes.  Abdominal: Soft. Bowel sounds are normal. She exhibits no distension.  Musculoskeletal: She exhibits no edema.  Neurological: She is alert.  Skin: Skin is warm and dry. She is not diaphoretic. No pallor.  Psychiatric: She has a normal mood and affect. Her behavior is normal. Judgment and thought content normal.    Results for orders placed or performed in visit on 08/28/17  COMPLETE METABOLIC PANEL WITH GFR  Result Value Ref Range   Glucose, Bld 86 65 - 139 mg/dL   BUN 8 7 - 25 mg/dL   Creat 0.72 0.50 - 1.10 mg/dL   GFR, Est Non African American 100 > OR = 60 mL/min/1.46m2   GFR, Est African American 116 > OR = 60 mL/min/1.43m2   BUN/Creatinine Ratio NOT APPLICABLE 6 - 22 (calc)   Sodium 138 135 - 146 mmol/L   Potassium 3.8 3.5 - 5.3 mmol/L   Chloride 104 98 - 110 mmol/L   CO2 30 20 - 32 mmol/L   Calcium 8.8 8.6 - 10.2 mg/dL   Total Protein 6.9 6.1 - 8.1 g/dL   Albumin 3.5 (L) 3.6 - 5.1 g/dL   Globulin 3.4 1.9 - 3.7 g/dL (calc)   AG Ratio 1.0 1.0 - 2.5 (calc)   Total Bilirubin 0.2 0.2 - 1.2 mg/dL   Alkaline phosphatase (APISO) 116 (H) 33 - 115 U/L   AST 12 10 - 35 U/L   ALT 10 6 - 29 U/L  Hemoglobin A1c  Result Value Ref Range  Hgb A1c MFr Bld 6.0 (H) <5.7 % of total Hgb    Mean Plasma Glucose 126 (calc)   eAG (mmol/L) 7.0 (calc)  Lipid panel  Result Value Ref Range   Cholesterol 145 <200 mg/dL   HDL 36 (L) >50 mg/dL   Triglycerides 61 <150 mg/dL   LDL Cholesterol (Calc) 94 mg/dL (calc)   Total CHOL/HDL Ratio 4.0 <5.0 (calc)   Non-HDL Cholesterol (Calc) 109 <130 mg/dL (calc)      Assessment & Plan:   Problem List Items Addressed This Visit      Cardiovascular and Mediastinum   Hypertension, goal below 140/90 - Primary (Chronic)    conrolled today; try to follow DASH guidelines      Relevant Medications   amLODipine (NORVASC) 5 MG tablet     Respiratory   Mild persistent asthma with allergic rhinitis without complication (Chronic)    Advised pneumonia vaccine; flu shot today; start antihitamine before allergies get worse and hopefully will prevent flares      Relevant Medications   albuterol (PROAIR HFA) 108 (90 Base) MCG/ACT inhaler   Allergic rhinitis (Chronic)    Suggested treatment during peak allergy season; start claritin or zyrtec OTC during peak months        Other   Pre-diabetes (Chronic)    Check A1c      Relevant Orders   Hemoglobin A1c   Microcytosis    Check CBC to see if thal trait (inherited) or low iron      Relevant Orders   CBC with Differential/Platelet   Iron, TIBC and Ferritin Panel   Low serum vitamin D    Check level today      Relevant Orders   VITAMIN D 25 Hydroxy (Vit-D Deficiency, Fractures)   Cervical stenosis of spinal canal    Continue cymbalta       Other Visit Diagnoses    Need for influenza vaccination       Relevant Orders   Flu Vaccine QUAD 6+ mos PF IM (Fluarix Quad PF) (Completed)   Need for 23-polyvalent pneumococcal polysaccharide vaccine       Relevant Orders   Pneumococcal polysaccharide vaccine 23-valent greater than or equal to 2yo subcutaneous/IM (Completed)   Low HDL (under 40)       Relevant Orders   Lipid panel   Medication monitoring encounter       Relevant Orders    COMPLETE METABOLIC PANEL WITH GFR       Follow up plan: Return in about 6 months (around 08/30/2018) for follow-up visit with Dr. Sanda Klein.  An after-visit summary was printed and given to the patient at [place].  Please see the patient instructions which may contain other information and recommendations beyond what is mentioned above in the assessment and plan.  Meds ordered this encounter  Medications  . albuterol (PROAIR HFA) 108 (90 Base) MCG/ACT inhaler    Sig: Inhale 1-2 puffs into the lungs every 4 (four) hours as needed for wheezing or shortness of breath.    Dispense:  1 Inhaler    Refill:  1  . amLODipine (NORVASC) 5 MG tablet    Sig: Take 1 tablet (5 mg total) by mouth every evening.    Dispense:  90 tablet    Refill:  3    Orders Placed This Encounter  Procedures  . Flu Vaccine QUAD 6+ mos PF IM (Fluarix Quad PF)  . Pneumococcal polysaccharide vaccine 23-valent greater than or equal to 2yo subcutaneous/IM  . COMPLETE  METABOLIC PANEL WITH GFR  . Hemoglobin A1c  . Lipid panel  . CBC with Differential/Platelet  . Iron, TIBC and Ferritin Panel  . VITAMIN D 25 Hydroxy (Vit-D Deficiency, Fractures)

## 2018-03-01 NOTE — Patient Instructions (Addendum)
Check out the information at familydoctor.org entitled "Nutrition for Weight Loss: What You Need to Know about Fad Diets" Try to lose between 1-2 pounds per week by taking in fewer calories and burning off more calories You can succeed by limiting portions, limiting foods dense in calories and fat, becoming more active, and drinking 8 glasses of water a day (64 ounces) Don't skip meals, especially breakfast, as skipping meals may alter your metabolism Do not use over-the-counter weight loss pills or gimmicks that claim rapid weight loss A healthy BMI (or body mass index) is between 18.5 and 24.9 You can calculate your ideal BMI at the [place] website ClubMonetize.fr  Let's get labs today If you have not heard anything from my staff in a week about any orders/referrals/studies from today, please contact us here to follow-up [phone]

## 2018-03-01 NOTE — Assessment & Plan Note (Signed)
Check CBC to see if thal trait (inherited) or low iron

## 2018-03-01 NOTE — Assessment & Plan Note (Signed)
Suggested treatment during peak allergy season; start claritin or zyrtec OTC during peak months

## 2018-03-01 NOTE — Assessment & Plan Note (Signed)
conrolled today; try to follow DASH guidelines

## 2018-03-01 NOTE — Assessment & Plan Note (Addendum)
Advised pneumonia vaccine; flu shot today; start antihitamine before allergies get worse and hopefully will prevent flares

## 2018-03-01 NOTE — Assessment & Plan Note (Signed)
Check A1c. 

## 2018-03-01 NOTE — Assessment & Plan Note (Signed)
Check level today 

## 2018-03-01 NOTE — Assessment & Plan Note (Signed)
Continue cymbalta  

## 2018-03-02 ENCOUNTER — Telehealth: Payer: Self-pay | Admitting: Family Medicine

## 2018-03-02 ENCOUNTER — Encounter: Payer: Self-pay | Admitting: Family Medicine

## 2018-03-02 ENCOUNTER — Ambulatory Visit: Payer: Self-pay | Admitting: Family Medicine

## 2018-03-02 ENCOUNTER — Other Ambulatory Visit: Payer: Self-pay | Admitting: Family Medicine

## 2018-03-02 ENCOUNTER — Telehealth: Payer: Self-pay

## 2018-03-02 DIAGNOSIS — R748 Abnormal levels of other serum enzymes: Secondary | ICD-10-CM

## 2018-03-02 MED ORDER — DULOXETINE HCL 20 MG PO CPEP: ORAL_CAPSULE | ORAL | 0 refills | Status: DC

## 2018-03-02 MED ORDER — VITAMIN D (ERGOCALCIFEROL) 1.25 MG (50000 UNIT) PO CAPS: 50000.0000 [IU] | ORAL_CAPSULE | ORAL | 1 refills | Status: AC

## 2018-03-02 NOTE — Progress Notes (Signed)
ergocalc Rx x 8 weeks then OTC

## 2018-03-02 NOTE — Telephone Encounter (Signed)
Please call patient

## 2018-03-02 NOTE — Telephone Encounter (Signed)
Patient stopped by the office for me to look at her RIGHT arm, same side as the flu shot No redness or swelling or discomfort at injection site RIGHT deltoid Right hand very mildly edematous; full strength 5/5, sensation intact distally; radial pulse 2+; no rash; Phalen's exacerbated sx on the right She has been posturing the right arm since flu shot  I don't think this is an allergic reaction Nothing on exam to suggest UE DVT Should resolve I am on-call all week, so contact me if needed She agrees

## 2018-03-02 NOTE — Telephone Encounter (Signed)
-----  Message from Arnetha Courser, MD sent at 03/02/2018  2:42 PM EDT ----- Please ADD ON an alk phos isoenzyme to blood in lab; diagnosis: elevated alkaline phosphatase

## 2018-03-02 NOTE — Telephone Encounter (Signed)
Pt came in and spoke with dr. lada

## 2018-03-02 NOTE — Telephone Encounter (Signed)
I called [facility] and spoke with the flow coordinator, Melissa.   She verified that my triage notes had come to the office.

## 2018-03-02 NOTE — Telephone Encounter (Signed)
She called in c/o her right hand being very swollen and throbbing after receiving a flu shot in her right upper arm yesterday.   She has a heating pad on her hand this morning for the pain.   Her upper arm is also sore however it's not anything beyond the normal soreness after she receives the flu shot.     She also has spinal stenosis on the right side that affects her.   She got the pneumonia shot in her left arm but the flu shot in her right arm yesterday.  I have routed a high priority note to Dr. Delight Ovens nurse pool making them aware of the swollen right hand for further disposition.   I let the pt know someone would be in contact with her from the office with further instructions. Reason for Disposition . Sounds like a severe, unusual reaction to the triager  Answer Assessment - Initial Assessment Questions 1. SYMPTOMS: "What is the main symptom?" (e.g., redness, swelling, pain)      I got a flu shot yesterday in right arm.   My right hand is completely swollen and throbbing.   I have a heating pad on it now.   I have spinal stenosis that affects my right side.    I usually get my flu shot in my left arm as a result.    I have asthma so they gave me the pneumonia shot in my left arm. 2. 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 3. SEVERITY: "How bad is it?"      Right hand is swollen and throbbing.   My right upper arm is also very sore.   I was prescribed 600 mg Ibuprofen last night which I took which did not help.   I removed my rings. 4. FEVER: "Is there a fever?" If so, ask: "What is it, how was it measured, and when did it start?"      No.   Last night I had chills but not this morning. 5. IMMUNIZATIONS GIVEN: "What shots have you recently received?"     Flu and pneumonia. 6. PAST REACTIONS: "Have you reacted to immunizations before?" If so, ask: "What happened?"     The flu shot usually makes me  feel sick for a day. 7. OTHER SYMPTOMS: "Do you have any other symptoms?"     My right arm is sore when I try to raise it where I got the shot.   My hand is what is different.  Protocols used: IMMUNIZATION REACTIONS-A-AH

## 2018-03-07 LAB — COMPLETE METABOLIC PANEL WITH GFR
AG Ratio: 1.2 (calc) (ref 1.0–2.5)
ALBUMIN MSPROF: 3.8 g/dL (ref 3.6–5.1)
ALKALINE PHOSPHATASE (APISO): 124 U/L — AB (ref 33–115)
ALT: 13 U/L (ref 6–29)
AST: 14 U/L (ref 10–35)
BILIRUBIN TOTAL: 0.4 mg/dL (ref 0.2–1.2)
BUN: 9 mg/dL (ref 7–25)
CO2: 28 mmol/L (ref 20–32)
Calcium: 8.9 mg/dL (ref 8.6–10.2)
Chloride: 102 mmol/L (ref 98–110)
Creat: 0.67 mg/dL (ref 0.50–1.10)
GFR, EST AFRICAN AMERICAN: 122 mL/min/{1.73_m2} (ref >=60)
GFR, Est Non African American: 105 mL/min/{1.73_m2} (ref >=60)
Globulin: 3.2 g/dL (calc) (ref 1.9–3.7)
Glucose, Bld: 104 mg/dL (ref 65–139)
Potassium: 3.9 mmol/L (ref 3.5–5.3)
SODIUM: 137 mmol/L (ref 135–146)
TOTAL PROTEIN: 7 g/dL (ref 6.1–8.1)

## 2018-03-07 LAB — CBC WITH DIFFERENTIAL/PLATELET
Basophils Absolute: 18 cells/uL (ref 0–200)
Basophils Relative: 0.4 %
EOS ABS: 108 {cells}/uL (ref 15–500)
Eosinophils Relative: 2.4 %
HCT: 38 % (ref 35.0–45.0)
Hemoglobin: 12.2 g/dL (ref 11.7–15.5)
Lymphs Abs: 1602 cells/uL (ref 850–3900)
MCH: 26 pg — AB (ref 27.0–33.0)
MCHC: 32.1 g/dL (ref 32.0–36.0)
MCV: 81 fL (ref 80.0–100.0)
MPV: 10.8 fL (ref 7.5–12.5)
Monocytes Relative: 11.1 %
Neutro Abs: 2273 cells/uL (ref 1500–7800)
Neutrophils Relative %: 50.5 %
PLATELETS: 237 10*3/uL (ref 140–400)
RBC: 4.69 10*6/uL (ref 3.80–5.10)
RDW: 13.1 % (ref 11.0–15.0)
TOTAL LYMPHOCYTE: 35.6 %
WBC: 4.5 10*3/uL (ref 3.8–10.8)
WBCMIX: 500 {cells}/uL (ref 200–950)

## 2018-03-07 LAB — HEMOGLOBIN A1C
Hgb A1c MFr Bld: 6 % of total Hgb — ABNORMAL HIGH (ref <5.7)
Mean Plasma Glucose: 126 (calc)
eAG (mmol/L): 7 (calc)

## 2018-03-07 LAB — LIPID PANEL
CHOL/HDL RATIO: 4.2 (calc) (ref <5.0)
CHOLESTEROL: 180 mg/dL (ref <200)
HDL: 43 mg/dL — AB (ref >50)
LDL CHOLESTEROL (CALC): 123 mg/dL — AB
NON-HDL CHOLESTEROL (CALC): 137 mg/dL — AB (ref <130)
Triglycerides: 47 mg/dL (ref <150)

## 2018-03-07 LAB — ALKALINE PHOSPHATASE ISOENZYMES
Alkaline phosphatase (APISO): 118 U/L — ABNORMAL HIGH (ref 33–115)
BONE ISOENZYMES (ALP ISO): 33 % (ref 28–66)
Intestinal Isoenzymes: 0 % — ABNORMAL LOW (ref 1–24)
Liver Isoenzymes: 67 % (ref 25–69)

## 2018-03-07 LAB — IRON,TIBC AND FERRITIN PANEL
%SAT: 22 % (calc) (ref 16–45)
Ferritin: 76 ng/mL (ref 16–232)
Iron: 66 ug/dL (ref 40–190)
TIBC: 296 mcg/dL (calc) (ref 250–450)

## 2018-03-07 LAB — TEST AUTHORIZATION

## 2018-03-07 LAB — VITAMIN D 25 HYDROXY (VIT D DEFICIENCY, FRACTURES): Vit D, 25-Hydroxy: 15 ng/mL — ABNORMAL LOW (ref 30–100)

## 2018-03-08 ENCOUNTER — Encounter: Payer: Self-pay | Admitting: Family Medicine

## 2018-03-08 ENCOUNTER — Other Ambulatory Visit: Payer: Self-pay | Admitting: Family Medicine

## 2018-03-08 DIAGNOSIS — R748 Abnormal levels of other serum enzymes: Secondary | ICD-10-CM

## 2018-03-08 NOTE — Progress Notes (Signed)
Ordered RUQ US

## 2018-03-16 ENCOUNTER — Ambulatory Visit
Admission: RE | Admit: 2018-03-16 | Discharge: 2018-03-16 | Disposition: A | Discharge status: Home / Self Care | Payer: No Typology Code available for payment source | Source: Ambulatory Visit | Attending: Family Medicine | Admitting: Family Medicine

## 2018-03-16 DIAGNOSIS — R748 Abnormal levels of other serum enzymes: Secondary | ICD-10-CM | POA: Diagnosis not present

## 2018-03-19 ENCOUNTER — Encounter: Payer: Self-pay | Admitting: Family Medicine

## 2018-04-02 IMAGING — CR DG FOOT 2V*R*
1 series · 2 of 2 positions shown · non-contrast
Comparison: No recent prior.

CLINICAL DATA: Pain.  No injury.

EXAM:
RIGHT FOOT - 2 VIEW

[Series 1: view not recorded · 0.14mm/px · 2 of 2 slices shown]
[im 1/2]
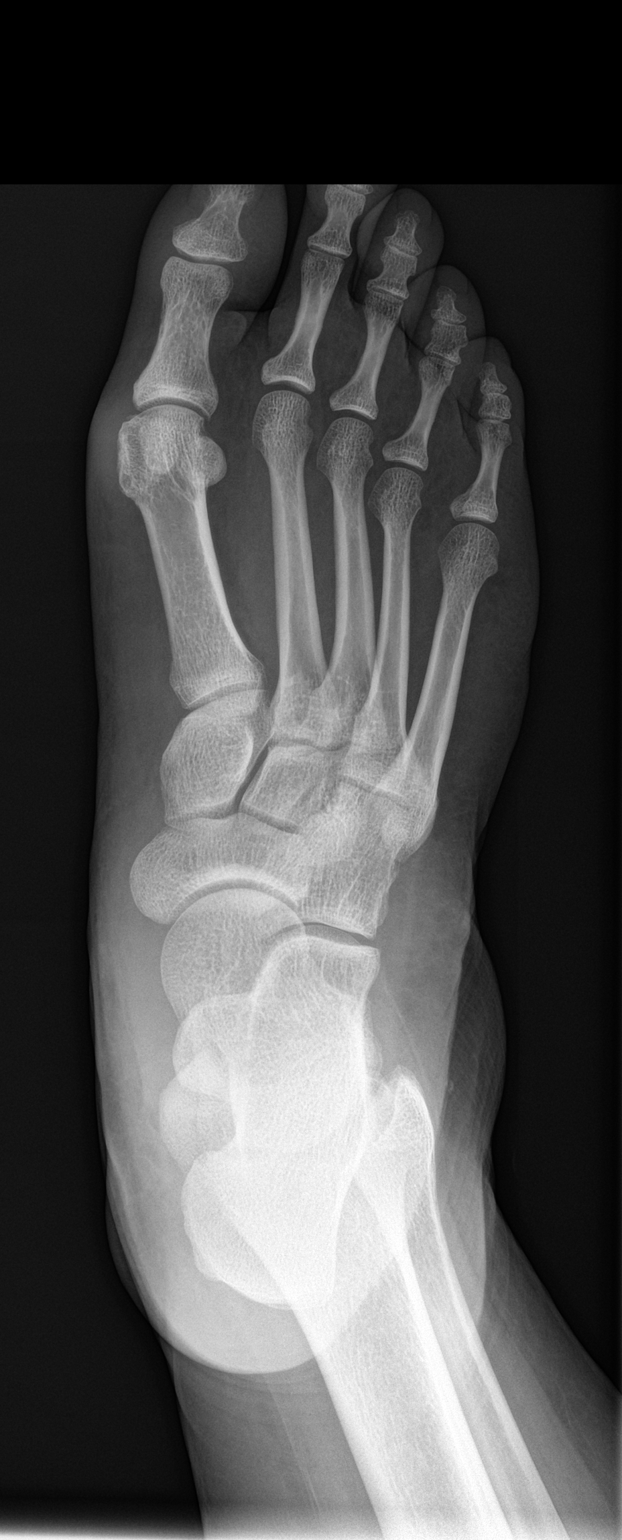
[im 2/2]
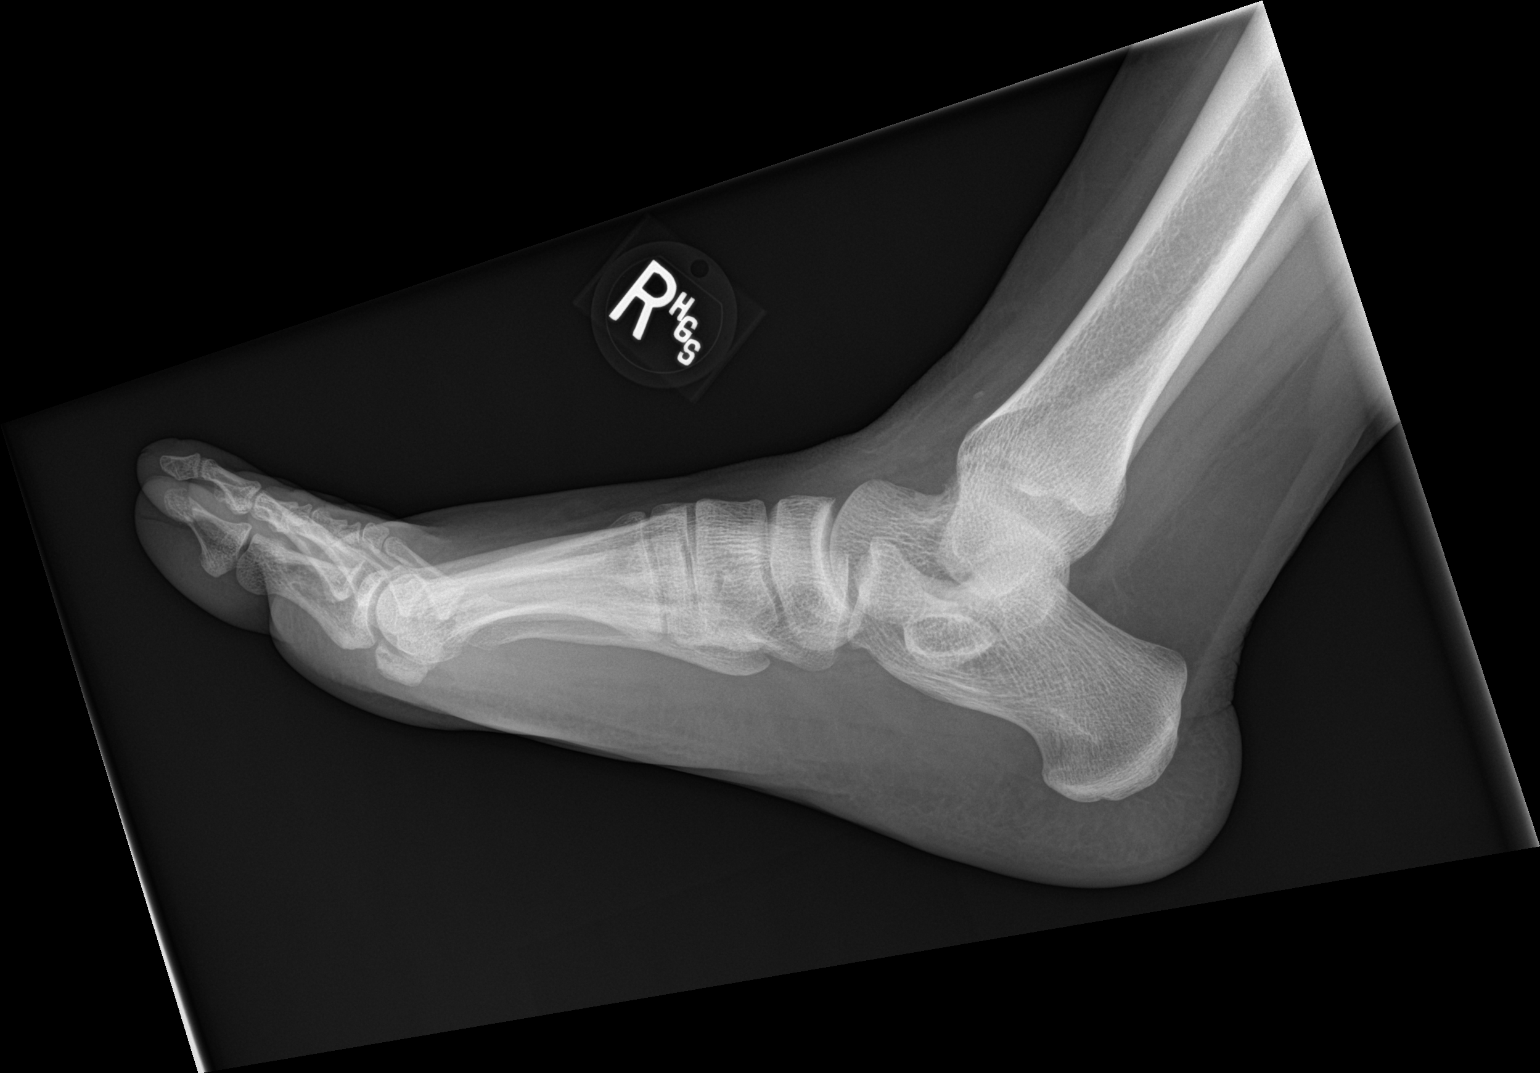

[2 of 2 positions shown; findings below may reference images not displayed]

FINDINGS: No acute bony or joint abnormality identified. No evidence of
fracture or dislocation. Mild degenerative changes first
metatarsophalangeal joint
IMPRESSION: Mild degenerative changes first metatarsophalangeal joint. No acute
bony abnormality.

## 2018-04-11 ENCOUNTER — Encounter: Payer: Self-pay | Admitting: Family Medicine

## 2018-04-11 DIAGNOSIS — R239 Unspecified skin changes: Secondary | ICD-10-CM

## 2018-04-11 DIAGNOSIS — R748 Abnormal levels of other serum enzymes: Secondary | ICD-10-CM

## 2018-04-12 ENCOUNTER — Other Ambulatory Visit: Payer: Self-pay

## 2018-04-12 DIAGNOSIS — R748 Abnormal levels of other serum enzymes: Secondary | ICD-10-CM

## 2018-04-12 DIAGNOSIS — R239 Unspecified skin changes: Secondary | ICD-10-CM

## 2018-04-12 NOTE — Telephone Encounter (Signed)
Alisha Kim, please destroy the orders for just the alk phos isoenzyme (may be up front near [place]) She will need alk phos iso, hepatic function panel, and TSH (I've ordered those together, future labs) to be done next week

## 2018-04-22 LAB — ALKALINE PHOSPHATASE ISOENZYMES
Alkaline phosphatase (APISO): 111 U/L (ref 33–115)
BONE ISOENZYMES (ALP ISO): 25 % — AB (ref 28–66)
INTESTINAL ISOENZYMES (ALP ISO): 5 % (ref 1–24)
LIVER ISOENZYMES (ALP ISO): 70 % — AB (ref 25–69)

## 2018-04-22 LAB — HEPATIC FUNCTION PANEL
AG Ratio: 1 (calc) (ref 1.0–2.5)
ALBUMIN MSPROF: 3.7 g/dL (ref 3.6–5.1)
ALT: 12 U/L (ref 6–29)
AST: 13 U/L (ref 10–35)
Alkaline phosphatase (APISO): 116 U/L — ABNORMAL HIGH (ref 33–115)
BILIRUBIN INDIRECT: 0.3 mg/dL (ref 0.2–1.2)
Bilirubin, Direct: 0.1 mg/dL (ref 0.0–0.2)
GLOBULIN: 3.7 g/dL (ref 1.9–3.7)
TOTAL PROTEIN: 7.4 g/dL (ref 6.1–8.1)
Total Bilirubin: 0.4 mg/dL (ref 0.2–1.2)

## 2018-04-22 LAB — TSH: TSH: 1.07 mIU/L

## 2018-04-23 ENCOUNTER — Other Ambulatory Visit: Payer: Self-pay | Admitting: Family Medicine

## 2018-04-23 DIAGNOSIS — R748 Abnormal levels of other serum enzymes: Secondary | ICD-10-CM

## 2018-04-23 NOTE — Progress Notes (Signed)
Recheck labs in 3 months

## 2018-04-28 ENCOUNTER — Ambulatory Visit (INDEPENDENT_AMBULATORY_CARE_PROVIDER_SITE_OTHER): Payer: No Typology Code available for payment source | Admitting: Gastroenterology

## 2018-04-28 ENCOUNTER — Encounter: Payer: Self-pay | Admitting: Gastroenterology

## 2018-04-28 ENCOUNTER — Other Ambulatory Visit: Payer: Self-pay

## 2018-04-28 VITALS — BP 130/91 | HR 94 | Resp 17 | Ht 65.0 in | Wt 249.2 lb

## 2018-04-28 DIAGNOSIS — R748 Abnormal levels of other serum enzymes: Secondary | ICD-10-CM

## 2018-04-28 DIAGNOSIS — Z1211 Encounter for screening for malignant neoplasm of colon: Secondary | ICD-10-CM

## 2018-04-28 NOTE — Progress Notes (Signed)
Alisha Darby, MD [address]  [place]  [place], [place] [address]  Main: [phone]  Fax: [phone]    Gastroenterology Consultation  Referring Provider:     Arnetha Courser, MD Primary Care Physician:  Arnetha Courser, MD Primary Gastroenterologist:  Dr. Cephas Kim Reason for Consultation:     Elevated alkaline phosphatase        HPI:   Alisha Kim is a 47 y.o. female referred by Dr. Sanda Kim, Satira Anis, MD  for consultation & management of elevated alkaline phosphatase.  This was first noted to be abnormal in 08/2017.  The levels were slightly more than normal.  Isoenzymes came back positive for liver.  Rest of the liver enzymes were normal.  TSH was normal.  She had ultrasound of the liver which was unremarkable.  She does not have chronic hepatitis B and C.  HIV negative.  Patient reports that she has been gaining weight in last 1 year significantly.  She does drink sodas regularly, consumes red meat, hamburger regularly.  She denies any GI symptoms.  She is also overdue for colon cancer screening.  NSAIDs: None  Antiplts/Anticoagulants/Anti thrombotics: None  GI Procedures: None She denies family history of GI malignancy  Past Medical History:  Diagnosis Date  . Adenomyosis 09/2015  . Allergy   . Anemia    history of  . Anxiety    controlled;   . Asthma   . Diabetes mellitus without complication ([place])    was told she was pre-diabetic  . Fibroids 08/14/2016   [place] March 2018  . History of Helicobacter pylori infection 08/08/2016   2004  . 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 Hypertension    controlled with medication;     Past Surgical History:  Procedure Laterality Date  . ABDOMINAL HYSTERECTOMY    . DENTAL SURGERY    . NO PAST SURGERIES    . TONSILLECTOMY    . VAGINAL HYSTERECTOMY Bilateral 09/29/2016   Procedure: HYSTERECTOMY VAGINAL WITH BILATERAL SALPINGECTOMY;  Surgeon: [name], MD;  Location: [facility] ORS;  Service: Gynecology;  Laterality:  Bilateral;     Current Outpatient Medications:  .  albuterol (PROAIR HFA) 108 (90 Base) MCG/ACT inhaler, Inhale 1-2 puffs into the lungs every 4 (four) hours as needed for wheezing or shortness of breath., Disp: 1 Inhaler, Rfl: 1 .  amLODipine (NORVASC) 5 MG tablet, Take 1 tablet (5 mg total) by mouth every evening., Disp: 90 tablet, Rfl: 3 .  hydrOXYzine (ATARAX/VISTARIL) 25 MG tablet, Take 1 tablet (25 mg total) by mouth every 8 (eight) hours as needed for itching., Disp: 30 tablet, Rfl: 2 .  PAZEO 0.7 % SOLN, Place 2 drops into both eyes as needed., Disp: , Rfl: 2 .  DULoxetine (CYMBALTA) 20 MG capsule, Take two pills (40 mg) daily for five days, then one pill (20 mg) daily for five days, then stop (Patient not taking: Reported on 04/28/2018), Disp: 15 capsule, Rfl: 0 .  DULoxetine (CYMBALTA) 60 MG capsule, Take by mouth daily., Disp: , Rfl: 3 .  ibuprofen (ADVIL,MOTRIN) 600 MG tablet, Take 1 tablet (600 mg total) by mouth every 6 (six) hours as needed (mild pain). (Patient not taking: Reported on 04/28/2018), Disp: 60 tablet, Rfl: 1   Family History  Problem Relation Age of Onset  . Alcohol abuse Mother   . Arthritis Mother   . Asthma Mother   . Depression Mother   . Drug abuse Mother   .  Hypertension Mother   . Mental illness Mother   . Cancer Mother        breast and lung  . Diabetes Mother   . Breast cancer Mother 10  . Alcohol abuse Father   . Drug abuse Father   . Cancer Father        lung  . Hypertension Brother   . Hearing loss Maternal Aunt   . Hypertension Maternal Aunt   . Stroke Maternal Aunt   . Alzheimer's disease Maternal Aunt   . Breast cancer Maternal Aunt        mat great aunt  . Cancer Maternal Grandmother        cervical  . Heart disease Maternal Grandmother   . Hypertension Maternal Grandmother      Social History   Tobacco Use  . Smoking status: Never Smoker  . Smokeless tobacco: Never Used  Substance Use Topics  . Alcohol use: Yes     Alcohol/week: 0.0 standard drinks    Comment: seldom use  . Drug use: No    Allergies as of 04/28/2018 - Review Complete 04/28/2018  Allergen Reaction Noted  . Meloxicam Other (See Comments) 06/06/2016  . Zithromax [azithromycin] Swelling 11/17/2014  . Pseudoephedrine-guaifenesin Swelling 03/15/2015  . Shellfish allergy Rash 11/17/2014  . Shellfish-derived products Rash 04/12/2014    Review of Systems:    All systems reviewed and negative except where noted in HPI.   Physical Exam:  BP (!) 130/91 (BP Location: Left Arm, Patient Position: Sitting, Cuff Size: Large)   Pulse 94   Resp 17   Ht 5\' 5"  (1.651 m)   Wt 249 lb 3.2 oz (113 kg)   LMP 09/29/2016 Comment: partial  BMI 41.47 kg/m  Patient's last menstrual period was 09/29/2016.  General:   Alert,  Well-developed, well-nourished, pleasant and cooperative in NAD Head:  Normocephalic and atraumatic. Eyes:  Sclera clear, no icterus.   Conjunctiva pink. Ears:  Normal auditory acuity. Nose:  No deformity, discharge, or lesions. Mouth:  No deformity or lesions,oropharynx pink & moist. Neck:  Supple; no masses or thyromegaly. Lungs:  Respirations even and unlabored.  Clear throughout to auscultation.   No wheezes, crackles, or rhonchi. No acute distress. Heart:  Regular rate and rhythm; no murmurs, clicks, rubs, or gallops. Abdomen:  Normal bowel sounds. Soft, non-tender and non-distended without masses, hepatosplenomegaly or hernias noted.  No guarding or rebound tenderness.   Rectal: Not performed Msk:  Symmetrical without gross deformities. Good, equal movement & strength bilaterally. Pulses:  Normal pulses noted. Extremities:  No clubbing or edema.  No cyanosis. Neurologic:  Alert and oriented x3;  grossly normal neurologically. Skin:  Intact without significant lesions or rashes. No jaundice. Psych:  Alert and cooperative. Normal mood and affect.  Imaging Studies: Reviewed  Assessment and Plan:   [name] is a  47 y.o. African-American female with morbid obesity, hypertension with isolated elevated alkaline phosphatase since 08/2017.  Isoenzymes confirmed that alkaline phosphatase is coming from liver.  Perform secondary liver disease work-up, if negative will refer to liver biopsy Highly encouraged her to lose weight, cut back on sodas, red meat and carbs She wishes to be referred to the dietitian, referral placed  Colon cancer screening: Overdue Schedule colonoscopy   Follow up in 3 months   Alisha Darby, MD

## 2018-04-29 LAB — IRON AND TIBC
Iron Saturation: 15 % (ref 15–55)
Iron: 50 ug/dL (ref 27–159)
TIBC: 324 ug/dL (ref 250–450)
UIBC: 274 ug/dL (ref 131–425)

## 2018-04-29 LAB — FERRITIN: Ferritin: 69 ng/mL (ref 15–150)

## 2018-04-29 LAB — ANA: Anti Nuclear Antibody(ANA): POSITIVE — AB

## 2018-04-30 LAB — MITOCHONDRIAL/SMOOTH MUSCLE AB PNL: Smooth Muscle Ab: 35 Units — ABNORMAL HIGH (ref 0–19)

## 2018-04-30 LAB — ALPHA-1-ANTITRYPSIN: A1 ANTITRYPSIN: 136 mg/dL (ref 101–187)

## 2018-04-30 LAB — CERULOPLASMIN: Ceruloplasmin: 32 mg/dL (ref 19.0–39.0)

## 2018-05-03 ENCOUNTER — Encounter: Payer: Self-pay | Admitting: Gastroenterology

## 2018-05-04 ENCOUNTER — Other Ambulatory Visit: Payer: Self-pay

## 2018-05-04 DIAGNOSIS — K754 Autoimmune hepatitis: Secondary | ICD-10-CM

## 2018-05-04 NOTE — Progress Notes (Signed)
Liver biopsy has been ordered, waiting for date from special procedures scheduling

## 2018-05-05 ENCOUNTER — Telehealth: Payer: Self-pay

## 2018-05-05 ENCOUNTER — Other Ambulatory Visit: Payer: Self-pay

## 2018-05-05 NOTE — Telephone Encounter (Signed)
Spoke with pt to confirm liver biopsy appt scheduled for 05/18/18 @ 9:00 am, pt verbalized understanding

## 2018-05-08 ENCOUNTER — Other Ambulatory Visit: Payer: Self-pay | Admitting: Family Medicine

## 2018-05-08 NOTE — Telephone Encounter (Signed)
See last instructions for the vitamin D (lab results) She should start taking 1,000 iu OTC daily

## 2018-05-10 ENCOUNTER — Encounter: Payer: Self-pay | Admitting: Gastroenterology

## 2018-05-11 NOTE — Telephone Encounter (Signed)
Pt.notified

## 2018-05-17 ENCOUNTER — Ambulatory Visit
Admission: RE | Admit: 2018-05-17 | Discharge: 2018-05-17 | Disposition: A | Discharge status: Home / Self Care | Payer: No Typology Code available for payment source | Attending: Gastroenterology | Admitting: Gastroenterology

## 2018-05-17 ENCOUNTER — Ambulatory Visit: Payer: No Typology Code available for payment source | Admitting: Certified Registered Nurse Anesthetist

## 2018-05-17 ENCOUNTER — Encounter: Payer: Self-pay | Admitting: *Deleted

## 2018-05-17 ENCOUNTER — Encounter
Admission: RE | Disposition: A | Discharge status: Home / Self Care | Payer: Self-pay | Source: Home / Self Care | Attending: Gastroenterology

## 2018-05-17 DIAGNOSIS — Z881 Allergy status to other antibiotic agents status: Secondary | ICD-10-CM | POA: Insufficient documentation

## 2018-05-17 DIAGNOSIS — F418 Other specified anxiety disorders: Secondary | ICD-10-CM | POA: Diagnosis not present

## 2018-05-17 DIAGNOSIS — Z1211 Encounter for screening for malignant neoplasm of colon: Secondary | ICD-10-CM | POA: Diagnosis present

## 2018-05-17 DIAGNOSIS — Z888 Allergy status to other drugs, medicaments and biological substances status: Secondary | ICD-10-CM | POA: Insufficient documentation

## 2018-05-17 DIAGNOSIS — Z886 Allergy status to analgesic agent status: Secondary | ICD-10-CM | POA: Insufficient documentation

## 2018-05-17 DIAGNOSIS — Z91013 Allergy to seafood: Secondary | ICD-10-CM | POA: Diagnosis not present

## 2018-05-17 DIAGNOSIS — E119 Type 2 diabetes mellitus without complications: Secondary | ICD-10-CM | POA: Insufficient documentation

## 2018-05-17 DIAGNOSIS — J45909 Unspecified asthma, uncomplicated: Secondary | ICD-10-CM | POA: Diagnosis not present

## 2018-05-17 DIAGNOSIS — Z79899 Other long term (current) drug therapy: Secondary | ICD-10-CM | POA: Diagnosis not present

## 2018-05-17 DIAGNOSIS — D125 Benign neoplasm of sigmoid colon: Secondary | ICD-10-CM | POA: Diagnosis not present

## 2018-05-17 DIAGNOSIS — K5730 Diverticulosis of large intestine without perforation or abscess without bleeding: Secondary | ICD-10-CM | POA: Insufficient documentation

## 2018-05-17 DIAGNOSIS — I10 Essential (primary) hypertension: Secondary | ICD-10-CM | POA: Insufficient documentation

## 2018-05-17 HISTORY — DX: Prediabetes: R73.03

## 2018-05-17 HISTORY — DX: Other specified anxiety disorders: F41.8

## 2018-05-17 HISTORY — PX: COLONOSCOPY WITH PROPOFOL: SHX5780

## 2018-05-17 LAB — GLUCOSE, CAPILLARY: Glucose-Capillary: 88 mg/dL (ref 70–99)

## 2018-05-17 SURGERY — COLONOSCOPY WITH PROPOFOL
Anesthesia: General

## 2018-05-17 MED ORDER — MIDAZOLAM HCL 2 MG/2ML IJ SOLN
INTRAMUSCULAR | Status: AC
  Filled 2018-05-17: qty 2

## 2018-05-17 MED ORDER — PROPOFOL 500 MG/50ML IV EMUL
INTRAVENOUS | Status: DC | PRN
  Administered 2018-05-17: 135 ug/kg/min via INTRAVENOUS

## 2018-05-17 MED ORDER — LIDOCAINE HCL (PF) 2 % IJ SOLN
INTRAMUSCULAR | Status: AC
  Filled 2018-05-17: qty 10

## 2018-05-17 MED ORDER — PROPOFOL 500 MG/50ML IV EMUL
INTRAVENOUS | Status: AC
  Filled 2018-05-17: qty 50

## 2018-05-17 MED ORDER — SODIUM CHLORIDE 0.9 % IV SOLN
INTRAVENOUS | Status: DC
  Administered 2018-05-17: 10:00:00 via INTRAVENOUS

## 2018-05-17 MED ORDER — LIDOCAINE HCL (CARDIAC) PF 100 MG/5ML IV SOSY
PREFILLED_SYRINGE | INTRAVENOUS | Status: DC | PRN
  Administered 2018-05-17: 50 mg via INTRAVENOUS

## 2018-05-17 MED ORDER — MIDAZOLAM HCL 2 MG/2ML IJ SOLN
INTRAMUSCULAR | Status: DC | PRN
  Administered 2018-05-17: 2 mg via INTRAVENOUS

## 2018-05-17 MED ORDER — PROPOFOL 10 MG/ML IV BOLUS
INTRAVENOUS | Status: DC | PRN
  Administered 2018-05-17: 90 mg via INTRAVENOUS

## 2018-05-17 NOTE — Anesthesia Postprocedure Evaluation (Signed)
Anesthesia Post Note  Patient: DERENDA GIDDINGS  Procedure(s) Performed: COLONOSCOPY WITH PROPOFOL (N/A )  Patient location during evaluation: Endoscopy Anesthesia Type: General Level of consciousness: awake and alert Pain management: pain level controlled Vital Signs Assessment: post-procedure vital signs reviewed and stable Respiratory status: spontaneous breathing, nonlabored ventilation, respiratory function stable and patient connected to nasal cannula oxygen Cardiovascular status: blood pressure returned to baseline and stable Postop Assessment: no apparent nausea or vomiting Anesthetic complications: no     Last Vitals:  Vitals:   05/17/18 1046 05/17/18 1056  BP:  119/78  Pulse: 66 69  Resp:    Temp:    SpO2: 100% 100%    Last Pain:  Vitals:   05/17/18 1056  TempSrc:   PainSc: 0-No pain                 Precious Haws Greely Atiyeh

## 2018-05-17 NOTE — Anesthesia Post-op Follow-up Note (Signed)
Anesthesia QCDR form completed.        

## 2018-05-17 NOTE — Anesthesia Preprocedure Evaluation (Signed)
Anesthesia Evaluation  Patient identified by MRN, date of birth, ID band Patient awake    Reviewed: Allergy & Precautions, H&P , NPO status , Patient's Chart, lab work & pertinent test results  History of Anesthesia Complications Negative for: history of anesthetic complications  Airway Mallampati: III  TM Distance: >3 FB Neck ROM: full    Dental  (+) Chipped   Pulmonary asthma ,           Cardiovascular Exercise Tolerance: Good hypertension, (-) angina(-) Past MI and (-) DOE      Neuro/Psych PSYCHIATRIC DISORDERS negative neurological ROS  negative psych ROS   GI/Hepatic negative GI ROS, (+) Cirrhosis       ,   Endo/Other  diabetes, Type 2  Renal/GU negative Renal ROS  negative genitourinary   Musculoskeletal   Abdominal   Peds  Hematology negative hematology ROS (+)   Anesthesia Other Findings Past Medical History: 09/2015: Adenomyosis No date: Allergy No date: Anemia     Comment:  history of No date: Anxiety     Comment:  controlled;  No date: Asthma No date: Depression with anxiety No date: Diabetes mellitus without complication [facility])     Comment:  was told she was pre-diabetic 08/14/2016: Fibroids     Comment:  [place] March 2018 08/08/2016: History of Helicobacter pylori infection     Comment:  2004 08/14/2016: Hydrosalpinx     Comment:  right No date: Hypertension     Comment:  controlled with medication;  No date: Pre-diabetes  Past Surgical History: No date: ABDOMINAL HYSTERECTOMY No date: DENTAL SURGERY No date: NO PAST SURGERIES No date: TONSILLECTOMY 09/29/2016: VAGINAL HYSTERECTOMY; Bilateral     Comment:  Procedure: HYSTERECTOMY VAGINAL WITH BILATERAL               SALPINGECTOMY;  Surgeon: [name], MD;  Location:               [facility] ORS;  Service: Gynecology;  Laterality: Bilateral;  BMI    Body Mass Index:  38.09 kg/m      Reproductive/Obstetrics negative OB ROS                              Anesthesia Physical Anesthesia Plan  ASA: III  Anesthesia Plan: General   Post-op Pain Management:    Induction: Intravenous  PONV Risk Score and Plan: Propofol infusion and TIVA  Airway Management Planned: Natural Airway and Nasal Cannula  Additional Equipment:   Intra-op Plan:   Post-operative Plan:   Informed Consent: I have reviewed the patients History and Physical, chart, labs and discussed the procedure including the risks, benefits and alternatives for the proposed anesthesia with the patient or authorized representative who has indicated his/her understanding and acceptance.   Dental Advisory Given  Plan Discussed with: Anesthesiologist, CRNA and Surgeon  Anesthesia Plan Comments: (Patient consented for risks of anesthesia including but not limited to:  - adverse reactions to medications - risk of intubation if required - damage to teeth, lips or other oral mucosa - sore throat or hoarseness - Damage to heart, brain, lungs or loss of life  Patient voiced understanding.)        Anesthesia Quick Evaluation

## 2018-05-17 NOTE — H&P (Signed)
Alisha Darby, MD [address]  [place]  [place], [place] [address]  Main: [phone]  Fax: [phone] Pager: [phone]  Primary Care Physician:  Arnetha Courser, MD Primary Gastroenterologist:  Dr. Cephas Kim  Pre-Procedure History & Physical: HPI:  Alisha Kim is a 47 y.o. female is here for an colonoscopy.   Past Medical History:  Diagnosis Date  . Adenomyosis 09/2015  . Allergy   . Anemia    history of  . Anxiety    controlled;   . Asthma   . Depression with anxiety   . Diabetes mellitus without complication ([place])    was told she was pre-diabetic  . Fibroids 08/14/2016   [place] March 2018  . History of Helicobacter pylori infection 08/08/2016   2004  . 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 Hypertension    controlled with medication;   . Pre-diabetes     Past Surgical History:  Procedure Laterality Date  . ABDOMINAL HYSTERECTOMY    . DENTAL SURGERY    . NO PAST SURGERIES    . TONSILLECTOMY    . VAGINAL HYSTERECTOMY Bilateral 09/29/2016   Procedure: HYSTERECTOMY VAGINAL WITH BILATERAL SALPINGECTOMY;  Surgeon: [name], MD;  Location: [facility] ORS;  Service: Gynecology;  Laterality: Bilateral;    Prior to Admission medications   Medication Sig Start Date End Date Taking? Authorizing Provider  albuterol (PROAIR HFA) 108 (90 Base) MCG/ACT inhaler Inhale 1-2 puffs into the lungs every 4 (four) hours as needed for wheezing or shortness of breath. 03/01/18   Arnetha Courser, MD  amLODipine (NORVASC) 5 MG tablet Take 1 tablet (5 mg total) by mouth every evening. 03/01/18   Lada, Satira Anis, MD  DULoxetine (CYMBALTA) 20 MG capsule Take two pills (40 mg) daily for five days, then one pill (20 mg) daily for five days, then stop Patient not taking: Reported on 04/28/2018 03/02/18   Arnetha Courser, MD  DULoxetine (CYMBALTA) 60 MG capsule Take by mouth daily. 03/02/18   [provider]  hydrOXYzine (ATARAX/VISTARIL) 25 MG tablet Take 1 tablet (25 mg total) by  mouth every 8 (eight) hours as needed for itching. 03/02/17   Arnetha Courser, MD  ibuprofen (ADVIL,MOTRIN) 600 MG tablet Take 1 tablet (600 mg total) by mouth every 6 (six) hours as needed (mild pain). Patient not taking: Reported on 04/28/2018 09/30/16   [name], MD  PAZEO 0.7 % SOLN Place 2 drops into both eyes as needed. 08/28/17   [provider]    Allergies as of 04/29/2018 - Review Complete 04/28/2018  Allergen Reaction Noted  . Meloxicam Other (See Comments) 06/06/2016  . Zithromax [azithromycin] Swelling 11/17/2014  . Pseudoephedrine-guaifenesin Swelling 03/15/2015  . Shellfish allergy Rash 11/17/2014  . Shellfish-derived products Rash 04/12/2014    Family History  Problem Relation Age of Onset  . Alcohol abuse Mother   . Arthritis Mother   . Asthma Mother   . Depression Mother   . Drug abuse Mother   . Hypertension Mother   . Mental illness Mother   . Cancer Mother        breast and lung  . Diabetes Mother   . Breast cancer Mother 73  . Alcohol abuse Father   . Drug abuse Father   . Cancer Father        lung  . Hypertension Brother   . Hearing loss Maternal Aunt   . Hypertension Maternal Aunt   . Stroke Maternal Aunt   .  Alzheimer's disease Maternal Aunt   . Breast cancer Maternal Aunt        mat great aunt  . Cancer Maternal Grandmother        cervical  . Heart disease Maternal Grandmother   . Hypertension Maternal Grandmother     Social History   Socioeconomic History  . Marital status: Married    Spouse name: Not on file  . Number of children: Not on file  . Years of education: Not on file  . Highest education level: Not on file  Occupational History  . Not on file  Social Needs  . Financial resource strain: Not on file  . Food insecurity:    Worry: Not on file    Inability: Not on file  . Transportation needs:    Medical: Not on file    Non-medical: Not on file  Tobacco Use  . Smoking status: Never Smoker  . Smokeless  tobacco: Never Used  Substance and Sexual Activity  . Alcohol use: Yes    Alcohol/week: 0.0 standard drinks    Comment: seldom use  . Drug use: No  . Sexual activity: Yes    Partners: Male    Birth control/protection: None, Surgical  Lifestyle  . Physical activity:    Days per week: Not on file    Minutes per session: Not on file  . Stress: Not on file  Relationships  . Social connections:    Talks on phone: Not on file    Gets together: Not on file    Attends religious service: Not on file    Active member of club or organization: Not on file    Attends meetings of clubs or organizations: Not on file    Relationship status: Not on file  . Intimate partner violence:    Fear of current or ex partner: Not on file    Emotionally abused: Not on file    Physically abused: Not on file    Forced sexual activity: Not on file  Other Topics Concern  . Not on file  Social History Narrative  . Not on file    Review of Systems: See HPI, otherwise negative ROS  Physical Exam: BP (!) 133/98   Pulse 76   Temp 98 F (36.7 C) (Tympanic)   Resp 18   Ht 5\' 6"  (1.676 m)   Wt 107 kg   LMP 09/29/2016 Comment: partial  SpO2 100%   BMI 38.09 kg/m  General:   Alert,  pleasant and cooperative in NAD Head:  Normocephalic and atraumatic. Neck:  Supple; no masses or thyromegaly. Lungs:  Clear throughout to auscultation.    Heart:  Regular rate and rhythm. Abdomen:  Soft, nontender and nondistended. Normal bowel sounds, without guarding, and without rebound.   Neurologic:  Alert and  oriented x4;  grossly normal neurologically.  Impression/Plan: Alisha Kim is here for an colonoscopy to be performed for colon cancer screening  Risks, benefits, limitations, and alternatives regarding  colonoscopy have been reviewed with the patient.  Questions have been answered.  All parties agreeable.   Sherri Sear, MD  05/17/2018, 9:56 AM

## 2018-05-17 NOTE — Transfer of Care (Signed)
Immediate Anesthesia Transfer of Care Note  Patient: Alisha Kim  Procedure(s) Performed: COLONOSCOPY WITH PROPOFOL (N/A )  Patient Location: Endoscopy Unit  Anesthesia Type:General  Level of Consciousness: drowsy  Airway & Oxygen Therapy: Patient Spontanous Breathing  Post-op Assessment: Report given to RN and Post -op Vital signs reviewed and stable  Post vital signs: Reviewed and stable  Last Vitals:  Vitals Value Taken Time  BP    Temp    Pulse 75 05/17/2018 10:26 AM  Resp 19 05/17/2018 10:26 AM  SpO2 94 % 05/17/2018 10:26 AM  Vitals shown include unvalidated device data.  Last Pain:  Vitals:   05/17/18 0943  TempSrc: Tympanic  PainSc: 0-No pain         Complications: No apparent anesthesia complications

## 2018-05-17 NOTE — Op Note (Signed)
[facility] Gastroenterology Patient Name: Alisha Kim Procedure Date: 05/17/2018 10:07 AM MRN: [id number] Account #: [id number] Date of Birth: 1971/01/10 Admit Type: Outpatient Age: 47 Room: [facility] ENDO ROOM 2 Gender: Female Note Status: Finalized Procedure:            Colonoscopy Indications:          Screening for colorectal malignant neoplasm, This is                        the patient's first colonoscopy Providers:            Lin Landsman MD, MD Referring MD:         Arnetha Courser (Referring MD) Medicines:            Monitored Anesthesia Care Complications:        No immediate complications. Estimated blood loss: None. Procedure:            Pre-Anesthesia Assessment:                       - Prior to the procedure, a History and Physical was                        performed, and patient medications and allergies were                        reviewed. The patient is competent. The risks and                        benefits of the procedure and the sedation options and                        risks were discussed with the patient. All questions                        were answered and informed consent was obtained.                        Patient identification and proposed procedure were                        verified by the physician, the nurse, the                        anesthesiologist, the anesthetist and the technician in                        the pre-procedure area in the procedure room in the                        endoscopy suite. Mental Status Examination: alert and                        oriented. Airway Examination: normal oropharyngeal                        airway and neck mobility. Respiratory Examination:                        clear to auscultation. CV Examination: normal.  Prophylactic Antibiotics: The patient does not require                        prophylactic antibiotics. Prior Anticoagulants: The          patient has taken no previous anticoagulant or                        antiplatelet agents. ASA Grade Assessment: III - A                        patient with severe systemic disease. After reviewing                        the risks and benefits, the patient was deemed in                        satisfactory condition to undergo the procedure. The                        anesthesia plan was to use monitored anesthesia care                        (MAC). Immediately prior to administration of                        medications, the patient was re-assessed for adequacy                        to receive sedatives. The heart rate, respiratory rate,                        oxygen saturations, blood pressure, adequacy of                        pulmonary ventilation, and response to care were                        monitored throughout the procedure. The physical status                        of the patient was re-assessed after the procedure.                       After obtaining informed consent, the colonoscope was                        passed under direct vision. Throughout the procedure,                        the patient's blood pressure, pulse, and oxygen                        saturations were monitored continuously. The                        Colonoscope was introduced through the anus and                        advanced to the the terminal ileum, with identification  of the appendiceal orifice and IC valve. The                        colonoscopy was performed without difficulty. The                        patient tolerated the procedure well. The quality of                        the bowel preparation was evaluated using the BBPS                        [facility] Bowel Preparation Scale) with scores of: Right                        Colon = 3, Transverse Colon = 3 and Left Colon = 3                        (entire mucosa seen well with no residual staining,                         small fragments of stool or opaque liquid). The total                        BBPS score equals 9. Findings:      The perianal and digital rectal examinations were normal. Pertinent       negatives include normal sphincter tone and no palpable rectal lesions.      A diminutive polyp was found in the sigmoid colon. The polyp was       sessile. The polyp was removed with a cold biopsy forceps. Resection and       retrieval were complete.      A few diverticula were found in the sigmoid colon. There was no evidence       of diverticular bleeding.      The terminal ileum appeared normal.      The exam was otherwise without abnormality.      The retroflexed view of the distal rectum and anal verge was normal and       showed no anal or rectal abnormalities. Impression:           - One diminutive polyp in the sigmoid colon, removed                        with a cold biopsy forceps. Resected and retrieved.                       - Mild diverticulosis in the sigmoid colon. There was                        no evidence of diverticular bleeding.                       - The examined portion of the ileum was normal.                       - The examination was otherwise normal.                       -  The distal rectum and anal verge are normal on                        retroflexion view. Recommendation:       - Discharge patient to home (with spouse).                       - Resume previous diet today.                       - Continue present medications.                       - Await pathology results.                       - Repeat colonoscopy in 5-10 years for surveillance                        based on pathology results.                       - Return to my office as previously scheduled. Procedure Code(s):    --- Professional ---                       [phone], Colonoscopy, flexible; with biopsy, single or                        multiple Diagnosis Code(s):    --- Professional ---                        Z12.11, Encounter for screening for malignant neoplasm                        of colon                       D12.5, Benign neoplasm of sigmoid colon                       K57.30, Diverticulosis of large intestine without                        perforation or abscess without bleeding CPT copyright 2018 American Medical [employer]. All rights reserved. The codes documented in this report are preliminary and upon coder review may  be revised to meet current compliance requirements. Dr. Ulyess Mort Lin Landsman MD, MD 05/17/2018 10:25:10 AM This report has been signed electronically. Number of Addenda: 0 Note Initiated On: 05/17/2018 10:07 AM Scope Withdrawal Time: 0 hours 7 minutes 52 seconds  Total Procedure Duration: 0 hours 10 minutes 56 seconds       [facility]

## 2018-05-18 ENCOUNTER — Other Ambulatory Visit: Payer: Self-pay

## 2018-05-18 ENCOUNTER — Ambulatory Visit
Admission: RE | Admit: 2018-05-18 | Discharge: 2018-05-18 | Disposition: A | Discharge status: Home / Self Care | Payer: No Typology Code available for payment source | Source: Ambulatory Visit | Attending: Gastroenterology | Admitting: Gastroenterology

## 2018-05-18 DIAGNOSIS — Z79899 Other long term (current) drug therapy: Secondary | ICD-10-CM | POA: Diagnosis not present

## 2018-05-18 DIAGNOSIS — R7303 Prediabetes: Secondary | ICD-10-CM | POA: Insufficient documentation

## 2018-05-18 DIAGNOSIS — Z881 Allergy status to other antibiotic agents status: Secondary | ICD-10-CM | POA: Diagnosis not present

## 2018-05-18 DIAGNOSIS — K754 Autoimmune hepatitis: Secondary | ICD-10-CM

## 2018-05-18 DIAGNOSIS — I10 Essential (primary) hypertension: Secondary | ICD-10-CM | POA: Insufficient documentation

## 2018-05-18 DIAGNOSIS — J45909 Unspecified asthma, uncomplicated: Secondary | ICD-10-CM | POA: Diagnosis not present

## 2018-05-18 DIAGNOSIS — K760 Fatty (change of) liver, not elsewhere classified: Secondary | ICD-10-CM | POA: Diagnosis not present

## 2018-05-18 DIAGNOSIS — Z888 Allergy status to other drugs, medicaments and biological substances status: Secondary | ICD-10-CM | POA: Insufficient documentation

## 2018-05-18 DIAGNOSIS — Z8249 Family history of ischemic heart disease and other diseases of the circulatory system: Secondary | ICD-10-CM | POA: Diagnosis not present

## 2018-05-18 DIAGNOSIS — R7989 Other specified abnormal findings of blood chemistry: Secondary | ICD-10-CM | POA: Diagnosis not present

## 2018-05-18 DIAGNOSIS — F418 Other specified anxiety disorders: Secondary | ICD-10-CM | POA: Insufficient documentation

## 2018-05-18 DIAGNOSIS — E669 Obesity, unspecified: Secondary | ICD-10-CM | POA: Insufficient documentation

## 2018-05-18 DIAGNOSIS — K219 Gastro-esophageal reflux disease without esophagitis: Secondary | ICD-10-CM | POA: Insufficient documentation

## 2018-05-18 LAB — CBC
HCT: 39.7 % (ref 36.0–46.0)
Hemoglobin: 12.4 g/dL (ref 12.0–15.0)
MCH: 25.9 pg — ABNORMAL LOW (ref 26.0–34.0)
MCHC: 31.2 g/dL (ref 30.0–36.0)
MCV: 83.1 fL (ref 80.0–100.0)
PLATELETS: 261 10*3/uL (ref 150–400)
RBC: 4.78 MIL/uL (ref 3.87–5.11)
RDW: 14.9 % (ref 11.5–15.5)
WBC: 4.7 10*3/uL (ref 4.0–10.5)
nRBC: 0 % (ref 0.0–0.2)

## 2018-05-18 LAB — APTT: aPTT: 30 seconds (ref 24–36)

## 2018-05-18 LAB — PROTIME-INR
INR: 1.05
Prothrombin Time: 13.6 seconds (ref 11.4–15.2)

## 2018-05-18 MED ORDER — MIDAZOLAM HCL 5 MG/5ML IJ SOLN
INTRAMUSCULAR | Status: AC
  Filled 2018-05-18: qty 5

## 2018-05-18 MED ORDER — FENTANYL CITRATE (PF) 100 MCG/2ML IJ SOLN
INTRAMUSCULAR | Status: AC | PRN
  Administered 2018-05-18 (×2): 50 ug via INTRAVENOUS

## 2018-05-18 MED ORDER — SODIUM CHLORIDE 0.9 % IV SOLN
INTRAVENOUS | Status: DC
  Administered 2018-05-18: 10:00:00 via INTRAVENOUS

## 2018-05-18 MED ORDER — FENTANYL CITRATE (PF) 100 MCG/2ML IJ SOLN
INTRAMUSCULAR | Status: AC
  Filled 2018-05-18: qty 4

## 2018-05-18 MED ORDER — MIDAZOLAM HCL 2 MG/2ML IJ SOLN
INTRAMUSCULAR | Status: AC | PRN
  Administered 2018-05-18 (×3): 1 mg via INTRAVENOUS

## 2018-05-18 NOTE — Procedures (Signed)
Pre Procedure Dx: Elevated LFTs °Post Procedural Dx: Same ° °Technically successful US guided biopsy of right lobe of the liver. ° °EBL: Trace ° °No immediate complications.  ° °Jay Dinorah Masullo, MD °Pager #: [phone] ° ° °

## 2018-05-18 NOTE — Consult Note (Signed)
Chief Complaint: Elevated LFTs  Referring Physician(s): Lin Landsman  Patient Status: [facility] - Out-pt  History of Present Illness: Alisha Kim is a 47 y.o. female with past medical history significant for hypertension, GERD, obesity and prediabetes who was found to have elevated LFTs of uncertain etiology who presents today for ultrasound-guided random liver biopsy.  The patient is accompanied by her husband though serves as her own historian.  Patient has recovered from her screening colonoscopy performed yesterday.  She is currently without complaint.  Specifically, no change in appetite or energy level.  No chest pain, shortness of breath, fever or chills.  No yellowing of the skin or eyes.  No abdominal pain, constipation, diarrhea, bloody or melanotic stools  Past Medical History:  Diagnosis Date  . Adenomyosis 09/2015  . Allergy   . Anemia    history of  . Anxiety    controlled;   . Asthma   . Depression with anxiety   . Diabetes mellitus without complication ([place])    was told she was pre-diabetic  . Fibroids 08/14/2016   [place] March 2018  . History of Helicobacter pylori infection 08/08/2016   2004  . 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 Hypertension    controlled with medication;   . Pre-diabetes     Past Surgical History:  Procedure Laterality Date  . ABDOMINAL HYSTERECTOMY    . COLONOSCOPY WITH PROPOFOL N/A 05/17/2018   Procedure: COLONOSCOPY WITH PROPOFOL;  Surgeon: Lin Landsman, MD;  Location: [facility] ENDOSCOPY;  Service: Gastroenterology;  Laterality: N/A;  . DENTAL SURGERY    . NO PAST SURGERIES    . TONSILLECTOMY    . VAGINAL HYSTERECTOMY Bilateral 09/29/2016   Procedure: HYSTERECTOMY VAGINAL WITH BILATERAL SALPINGECTOMY;  Surgeon: [name], MD;  Location: [facility] ORS;  Service: Gynecology;  Laterality: Bilateral;    Allergies: Meloxicam; Zithromax [azithromycin]; Pseudoephedrine-guaifenesin; Shellfish allergy; and Shellfish-derived  products  Medications: Prior to Admission medications   Medication Sig Start Date End Date Taking? Authorizing Provider  albuterol (PROAIR HFA) 108 (90 Base) MCG/ACT inhaler Inhale 1-2 puffs into the lungs every 4 (four) hours as needed for wheezing or shortness of breath. 03/01/18  Yes Lada, Satira Anis, MD  amLODipine (NORVASC) 5 MG tablet Take 1 tablet (5 mg total) by mouth every evening. 03/01/18  Yes Lada, Satira Anis, MD  PAZEO 0.7 % SOLN Place 2 drops into both eyes as needed. 08/28/17  Yes [provider]  DULoxetine (CYMBALTA) 60 MG capsule Take by mouth daily. 03/02/18   [provider]  hydrOXYzine (ATARAX/VISTARIL) 25 MG tablet Take 1 tablet (25 mg total) by mouth every 8 (eight) hours as needed for itching. Patient not taking: Reported on 05/18/2018 03/02/17   Arnetha Courser, MD     Family History  Problem Relation Age of Onset  . Alcohol abuse Mother   . Arthritis Mother   . Asthma Mother   . Depression Mother   . Drug abuse Mother   . Hypertension Mother   . Mental illness Mother   . Cancer Mother        breast and lung  . Diabetes Mother   . Breast cancer Mother 5  . Alcohol abuse Father   . Drug abuse Father   . Cancer Father        lung  . Hypertension Brother   . Hearing loss Maternal Aunt   . Hypertension Maternal Aunt   . Stroke Maternal Aunt   . Alzheimer's disease Maternal  Aunt   . Breast cancer Maternal Aunt        mat great aunt  . Cancer Maternal Grandmother        cervical  . Heart disease Maternal Grandmother   . Hypertension Maternal Grandmother     Social History   Socioeconomic History  . Marital status: Married    Spouse name: Not on file  . Number of children: Not on file  . Years of education: Not on file  . Highest education level: Not on file  Occupational History  . Not on file  Social Needs  . Financial resource strain: Not on file  . Food insecurity:    Worry: Not on file    Inability: Not on file  .  Transportation needs:    Medical: Not on file    Non-medical: Not on file  Tobacco Use  . Smoking status: Never Smoker  . Smokeless tobacco: Never Used  Substance and Sexual Activity  . Alcohol use: Yes    Alcohol/week: 0.0 standard drinks    Comment: seldom use  . Drug use: No  . Sexual activity: Yes    Partners: Male    Birth control/protection: None, Surgical  Lifestyle  . Physical activity:    Days per week: Not on file    Minutes per session: Not on file  . Stress: Not on file  Relationships  . Social connections:    Talks on phone: Not on file    Gets together: Not on file    Attends religious service: Not on file    Active member of club or organization: Not on file    Attends meetings of clubs or organizations: Not on file    Relationship status: Not on file  Other Topics Concern  . Not on file  Social History Narrative  . Not on file    ECOG Status: 0 - Asymptomatic  Review of Systems: A 12 point ROS discussed and pertinent positives are indicated in the HPI above.  All other systems are negative.  Review of Systems  Constitutional: Negative for activity change, appetite change, chills, fatigue, fever and unexpected weight change.  Respiratory: Negative.   Cardiovascular: Negative.   Gastrointestinal: Negative.     Vital Signs: BP 124/80   Pulse 66   Temp 97.7 F (36.5 C) (Oral)   Resp 20   Ht 5\' 6"  (1.676 m)   Wt 107 kg   LMP 09/29/2016 Comment: partial  SpO2 100%   BMI 38.09 kg/m   Physical Exam  Constitutional: She appears well-developed and well-nourished.  HENT:  Head: Normocephalic and atraumatic.  Cardiovascular: Regular rhythm.  Pulmonary/Chest: Effort normal and breath sounds normal.  Abdominal: Soft. Bowel sounds are normal.  Skin: Skin is warm and dry.  Psychiatric: She has a normal mood and affect.    Imaging: No results found.  Labs:  CBC: Recent Labs    03/01/18 0830 05/18/18 0912  WBC 4.5 4.7  HGB 12.2 12.4  HCT  38.0 39.7  PLT 237 261    COAGS: Recent Labs    05/18/18 0912  INR 1.05  APTT 30    BMP: Recent Labs    08/28/17 1527 03/01/18 0830  NA 138 137  K 3.8 3.9  CL 104 102  CO2 30 28  GLUCOSE 86 104  BUN 8 9  CALCIUM 8.8 8.9  CREATININE 0.72 0.67  GFRNONAA 100 105  GFRAA 116 122    LIVER FUNCTION TESTS: Recent Labs  08/28/17 1527 03/01/18 0830 04/19/18 0846  BILITOT 0.2 0.4 0.4  AST 12 14 13   ALT 10 13 12   PROT 6.9 7.0 7.4    TUMOR MARKERS: No results for input(s): AFPTM, CEA, CA199, CHROMGRNA in the last 8760 hours.  Assessment and Plan:  Alisha Kim is a 47 y.o. female with past medical history significant for hypertension, GERD, obesity and prediabetes who was found to have elevated LFTs of uncertain etiology who presents today for ultrasound-guided random liver biopsy.    She is currently without complaint.   Risks and benefits of ultrasound-guided random liver biopsy was discussed with the patient including, but not limited to bleeding, infection, damage to adjacent structures or low yield requiring additional tests.  All of the patient's questions were answered, patient is agreeable to proceed.  Consent signed and in chart.  Thank you for this interesting consult.  I greatly enjoyed meeting Alisha Kim and look forward to participating in their care.  A copy of this report was sent to the requesting provider on this date.  Electronically Signed: Sandi Mariscal, MD 05/18/2018, 10:14 AM   I spent a total of 15 Minutes in face to face in clinical consultation, greater than 50% of which was counseling/coordinating care for ultrasound-guided random liver biopsy

## 2018-05-19 LAB — SURGICAL PATHOLOGY

## 2018-05-24 ENCOUNTER — Encounter: Payer: Self-pay | Admitting: Gastroenterology

## 2018-05-27 ENCOUNTER — Encounter: Payer: Self-pay | Admitting: Gastroenterology

## 2018-05-31 ENCOUNTER — Encounter: Payer: Self-pay | Admitting: Family Medicine

## 2018-06-05 ENCOUNTER — Encounter: Payer: Self-pay | Admitting: Gastroenterology

## 2018-07-05 ENCOUNTER — Encounter: Payer: Self-pay | Admitting: Nurse Practitioner

## 2018-07-05 ENCOUNTER — Ambulatory Visit (INDEPENDENT_AMBULATORY_CARE_PROVIDER_SITE_OTHER): Payer: No Typology Code available for payment source | Admitting: Nurse Practitioner

## 2018-07-05 VITALS — BP 114/68 | HR 69 | Temp 98.6°F | Resp 16 | Ht 66.0 in | Wt 236.7 lb

## 2018-07-05 DIAGNOSIS — R52 Pain, unspecified: Secondary | ICD-10-CM | POA: Diagnosis not present

## 2018-07-05 DIAGNOSIS — R0981 Nasal congestion: Secondary | ICD-10-CM

## 2018-07-05 DIAGNOSIS — J029 Acute pharyngitis, unspecified: Secondary | ICD-10-CM

## 2018-07-05 DIAGNOSIS — R6883 Chills (without fever): Secondary | ICD-10-CM

## 2018-07-05 LAB — POCT INFLUENZA A/B
Influenza A, POC: NEGATIVE
Influenza B, POC: NEGATIVE

## 2018-07-05 MED ORDER — FLUTICASONE PROPIONATE 50 MCG/ACT NA SUSP: 2.0000 | Freq: Every day | NASAL | 6 refills | Status: DC

## 2018-07-05 NOTE — Progress Notes (Signed)
Name: Alisha Kim   MRN: [id number]    DOB: [birth year]   Date:07/05/2018       Progress Note  Subjective  Chief Complaint  Chief Complaint  Patient presents with  . Nasal Congestion    Onset-Friday, has had a slight sore throat, chills, body aches, congestion and runny nose. Has taken Ibuprofen for symptom relief.  . Generalized Body Aches  . Chills    HPI  States Friday morning woke up with sore throat, congestion, rhinorrhea worsened over the weekend to chills, and body aches in chest, shoulder and arms- states feels like she has been lifting weights. States nausea and more frequent bowel movements, formed though.  Took an ibuprofen this morning. Has fatty liver disease and was concerned about which medications she could take.   No rash, shortness of breath, cough, vomiting, diarrhea.    Patient Active Problem List   Diagnosis Date Noted  . Morbid obesity ([place]) 05/31/2018  . Colon cancer screening   . Allergic rhinitis 03/01/2018  . Cervical stenosis of spinal canal 08/28/2017  . S/P vaginal hysterectomy 09/29/2016  . History of Helicobacter pylori infection 08/08/2016  . Microcytosis 08/01/2016  . Heel spur, left 04/07/2016  . Bilateral foot pain 04/04/2016  . Anxiety 04/04/2016  . Hypertension, goal below 140/90 07/24/2015  . Bilateral hip pain 11/17/2014  . Atopic dermatitis 11/17/2014  . Low serum vitamin D 11/17/2014  . Pre-diabetes 11/17/2014  . Depression with anxiety 11/17/2014  . Mild persistent asthma with allergic rhinitis without complication 24/26/8341    Past Medical History:  Diagnosis Date  . Adenomyosis 09/2015  . Allergy   . Anemia    history of  . Anxiety    controlled;   . Asthma   . Depression with anxiety   . Diabetes mellitus without complication ([place])    was told she was pre-diabetic  . Fibroids 08/14/2016   [place] March 2018  . History of Helicobacter pylori infection 08/08/2016   2004  . 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 Hypertension     controlled with medication;   . Pre-diabetes     Past Surgical History:  Procedure Laterality Date  . ABDOMINAL HYSTERECTOMY    . COLONOSCOPY WITH PROPOFOL N/A 05/17/2018   Procedure: COLONOSCOPY WITH PROPOFOL;  Surgeon: Lin Landsman, MD;  Location: [facility] ENDOSCOPY;  Service: Gastroenterology;  Laterality: N/A;  . DENTAL SURGERY    . NO PAST SURGERIES    . TONSILLECTOMY    . VAGINAL HYSTERECTOMY Bilateral 09/29/2016   Procedure: HYSTERECTOMY VAGINAL WITH BILATERAL SALPINGECTOMY;  Surgeon: [name], MD;  Location: [facility] ORS;  Service: Gynecology;  Laterality: Bilateral;    Social History   Tobacco Use  . Smoking status: Never Smoker  . Smokeless tobacco: Never Used  Substance Use Topics  . Alcohol use: Yes    Alcohol/week: 0.0 standard drinks    Comment: seldom use     Current Outpatient Medications:  .  albuterol (PROAIR HFA) 108 (90 Base) MCG/ACT inhaler, Inhale 1-2 puffs into the lungs every 4 (four) hours as needed for wheezing or shortness of breath., Disp: 1 Inhaler, Rfl: 1 .  amLODipine (NORVASC) 5 MG tablet, Take 1 tablet (5 mg total) by mouth every evening., Disp: 90 tablet, Rfl: 3 .  hydrOXYzine (ATARAX/VISTARIL) 25 MG tablet, Take 1 tablet (25 mg total) by mouth every 8 (eight) hours as needed for itching., Disp: 30 tablet, Rfl: 2 .  PAZEO 0.7 % SOLN, Place 2 drops into both  eyes as needed., Disp: , Rfl: 2 .  DULoxetine (CYMBALTA) 60 MG capsule, Take by mouth daily., Disp: , Rfl: 3  Allergies  Allergen Reactions  . Meloxicam Other (See Comments)    Felt horrible, headache, bloating, back pain, SHOB; no rash  . Zithromax [Azithromycin] Swelling    Tongue   . Pseudoephedrine-Guaifenesin Swelling    Of tongue  . Shellfish Allergy Rash  . Shellfish-Derived Products Rash    ROS   No other specific complaints in a complete review of systems (except as listed in HPI above).  Objective  Vitals:   07/05/18 0950  BP: 114/68  Pulse: 69  Resp: 16   Temp: 98.6 F (37 C)  TempSrc: Oral  SpO2: 98%  Weight: 236 lb 11.2 oz (107.4 kg)  Height: 5\' 6"  (1.676 m)    Body mass index is 38.2 kg/m.  Nursing Note and Vital Signs reviewed.  Physical Exam Constitutional:      Appearance: Normal appearance. She is not ill-appearing.  HENT:     Head: Normocephalic and atraumatic.     Right Ear: Hearing, ear canal and external ear normal. Tympanic membrane is retracted.     Left Ear: Hearing, ear canal and external ear normal. Tympanic membrane is retracted.     Nose: Congestion and rhinorrhea present.     Right Turbinates: Enlarged.     Left Turbinates: Enlarged.     Right Sinus: No maxillary sinus tenderness or frontal sinus tenderness.     Left Sinus: No maxillary sinus tenderness or frontal sinus tenderness.     Mouth/Throat:     Mouth: Mucous membranes are moist.     Pharynx: Oropharynx is clear. Uvula midline. Posterior oropharyngeal erythema present. No pharyngeal swelling, oropharyngeal exudate or uvula swelling.     Tonsils: No tonsillar abscesses.  Eyes:     General:        Right eye: No discharge.        Left eye: No discharge.     Conjunctiva/sclera: Conjunctivae normal.     Pupils: Pupils are equal, round, and reactive to light.  Neck:     Musculoskeletal: Normal range of motion.  Cardiovascular:     Rate and Rhythm: Normal rate and regular rhythm.  Pulmonary:     Effort: Pulmonary effort is normal.     Breath sounds: Normal breath sounds.  Abdominal:     General: Bowel sounds are normal.     Tenderness: There is no abdominal tenderness.  Lymphadenopathy:     Cervical: No cervical adenopathy.  Skin:    General: Skin is warm and dry.     Findings: No rash.  Neurological:     Mental Status: She is alert.  Psychiatric:        Mood and Affect: Mood normal.        Thought Content: Thought content normal.        Judgment: Judgment normal.      No results found for this or any previous visit (from the past 48  hour(s)).  Assessment & Plan  1. Chills Discussed OTC treatment of viral illness, consider influenza like illness vs URI- discussed s&s of secondary bacterial infections and when to [facility]; see AVS - POCT Influenza A/B  2. Generalized body aches - POCT Influenza A/B  3. Head congestion - POCT Influenza A/B  4. Sore throat - POCT Influenza A/B  5. Nasal congestion - fluticasone (FLONASE) 50 MCG/ACT nasal spray; Place 2 sprays into both nostrils daily.  Dispense: 16 g; Refill: 6    -Red flags and when to present for emergency care or RTC including fever >101.67F, chest pain, shortness of breath, new/worsening/un-resolving symptoms,  reviewed with patient at time of visit. Follow up and care instructions discussed and provided in AVS.

## 2018-07-05 NOTE — Patient Instructions (Addendum)
You likely have a viral upper respiratory infection (URI). Antibiotics will not reduce the number of days you are ill or prevent you from getting bacterial rhinosinusitis. A URI can take up to 14 days to resolve, but typically last between 7-11 days. Your body is so smart and strong that it will be fighting this illness off for you but it is important that you drink plenty of fluids, rest. Cover your nose/mouth when you cough or sneeze and wash your hands well and often. Here are some helpful things you can use or pick up over the counter from the pharmacy to help with your symptoms:   For Fever/Pain: Acetaminophen every 6 hours as needed (maximum of 2000mg  a day). If you are still uncomfortable you can add ibuprofen OR naproxen  For coughing: try dextromethorphan for a cough suppressant, and/or a cool mist humidifier, lozenges  For sore throat: saline gargles, honey herbal tea, lozenges, throat spray  To dry out your nose: try an antihistamine like loratadine (non-sedating) or diphenhydramine (sedating) or others To relieve a stuffy nose: try an oral decongestant  Like pseudoephedrine if you are under the age of 46 and do not have high blood pressure, neti pot To make blowing your nose easier and relieve chest congestion: guaifenesin 400mg  every 4-6 hours of guaifenesin ER [phone] mg every 12 hours. Do not take more than 2,400mg  a day.   Try to use PLAIN allergy medicine without the decongestant: Avoid: phenylephrine, phenylpropanolamine, and pseudoephredine

## 2018-07-07 ENCOUNTER — Encounter: Payer: Self-pay | Admitting: Dietician

## 2018-07-07 ENCOUNTER — Encounter: Payer: No Typology Code available for payment source | Attending: Family Medicine | Admitting: Dietician

## 2018-07-07 VITALS — Ht 66.0 in | Wt 233.1 lb

## 2018-07-07 DIAGNOSIS — Z6837 Body mass index (BMI) 37.0-37.9, adult: Principal | ICD-10-CM

## 2018-07-07 NOTE — Patient Instructions (Signed)
   Continue to make healthy, clean food choices, great job!  Aim for a balance of lean protein, small portions of healthy carbs, and plenty of low-carb veggies with meals.   OK to include some lean pork or beef 2 times a week (average). Try to limit/ avoid high fat/ processed meats as much as possible.   Continue to exercise regularly, consider adding some light exercise on another 1-2 days weekly.   Try using a free app to track food intake and/or activity.

## 2018-07-07 NOTE — Progress Notes (Signed)
Medical Nutrition Therapy: Visit start time: 1500  end time: 1600  Assessment:  Diagnosis: obesity, fatty liver  Past medical history: HTN Psychosocial issues/ stress concerns: high stress level per patient, feels she is not dealing well with stress  Preferred learning method:  . No preference indicated  Current weight: 233.1lbs Height: 5'6" Medications, supplements: reconciled list in medical record  Progress and evaluation: Patient reports she began a clean eating diet at the end of November, when diagnosed with fatty liver, and has lost about 15lbs. She states prior pain  in legs and feet has improved. She feels she might be undereating at this point; family tells her she is grumpy during the day. She seeks help in ensuring a nutritionally adequate diet and further improvement in liver and overall health.    Physical activity: cardio and weights 2 hours, 1 time per week  Dietary Intake:  Usual eating pattern includes 2-3 meals and 1 snacks per day. Dining out frequency: 2 meals per week.  Breakfast: yogurt or smoothie, coffee with maple syrup; sometimes skips when stressed Snack: usually none Lunch: salad, boiled eggs; or grilled chicken with asparagus or broccoli Snack: cashews, dark choc with almonds (earth fare); chips from [employer] when eating lunch there Supper: same as lunch; mostly chicken or [place], occasional salmon (does not like much); avoiding red meats ++ veggies cabbage, broccoli, asparagus, carrots, lentils, etc Snack: none Beverages: no sodas or juices, mostly water, sparkling or seltzer water  Nutrition Care Education: Topics covered: weight control, fatty liver Basic nutrition: basic food groups, appropriate nutrient balance, appropriate meal and snack schedule, general nutrition guidelines    Weight control: benefits of weight control, importance of making low fat and low sugar choices; basic meal planning for about 1500kcal while keeping carbohydrate to 40-45% of  calories, protein at 25-30%, and fat at 30%; benefits of tracking food intake and options for doing so; role of physical activity and goal for 150 minutes or more weekly Fatty liver: limiting processed foods including meats, sweets, and starches; limiting unhealthy fats; Mediterranean diet for overall health and clean eating; primary goal of weight loss  Nutritional Diagnosis:  [place]-2.2 Altered nutrition-related laboratory As related to fatty liver.  As evidenced by elevated liver enzymes, biopsy results per patient report. [place]-3.3 Overweight/obesity As related to history of excess calories and inactivity.  As evidenced by patient with current BMI of 37.5, currently following guidelines for weight loss..  Intervention:   Instruction as noted above.  Patient has been making significant diet and lifestyle changes to improve health and lose weight.  Established goals to allow for more variety and ensure adequate nutrition.    Education Materials given:  . Plate Planner with food lists . Mediterranean Diet ([place]) . Goals/ instructions   Learner/ who was taught:  . Patient   Level of understanding: Marland Kitchen Verbalizes/ demonstrates competency   Demonstrated degree of understanding via:   Teach back Learning barriers: . None  Willingness to learn/ readiness for change: . Eager, change in progress  Monitoring and Evaluation:  Dietary intake, exercise, and body weight      follow up: 08/05/18

## 2018-08-05 ENCOUNTER — Encounter: Payer: Self-pay | Admitting: Dietician

## 2018-08-05 ENCOUNTER — Encounter: Payer: No Typology Code available for payment source | Attending: Family Medicine | Admitting: Dietician

## 2018-08-05 VITALS — Ht 66.0 in | Wt 234.0 lb

## 2018-08-05 DIAGNOSIS — Z6837 Body mass index (BMI) 37.0-37.9, adult: Secondary | ICD-10-CM

## 2018-08-05 NOTE — Patient Instructions (Addendum)
   OK to try a protein bar for a breakfast when not hungry/ stressed. Look for one with 10 or more grams of protein and ideally under 10 grams of sugar. Preferably 3grams of fiber or more also.   When having a smoothie for lunch (or breakfast), make sure to include some fruit and/ or veggies + a source of protein ie yogurt, tofu, powdered peanut butter, or protein supplement + can also add a small portion of oats for healthy grain or chia or flax seeds for additional fiber, protein and healthy fat.   Continue with your healthy food choices and exercise, keep up daily movement on days you don't go to the gym.

## 2018-08-05 NOTE — Progress Notes (Signed)
Medical Nutrition Therapy: Visit start time: 1700  end time: 1745  Assessment:  Diagnosis: fatty liver disease, obesity Medical history changes: no changes Psychosocial issues/ stress concerns: high stress level on some days, work related  Current weight: 234.0lbs Height: 5'6" Medications, supplement changes: no changes   Progress and evaluation: Weight increased by 1lb since previous visit. Patient reports more frequent high-stress days recently which lead her to skip meals, mostly breakfast and sometimes lunch.  She voices some ongoing frustration with difficulty losing weight beyond the 15+ lbs she lost last fall. She continues to make healthy food choices in general, reports long gym workouts once weekly.   Physical activity: cardio + weight training 2 hours 1x weekly  Dietary Intake:  Usual eating pattern includes 1-3 meals and 0-1 snacks per day. Dining out frequency: 1-2 meals per week.  Breakfast: recently skipped more often due to stress Snack: none Lunch: salad or grilled chicken sandwich or plain chicken + steamed veg; or lean pork about 1x a week; occaisonally skipped meal Snack: occasional dark chocolate with almonds; rarely small piece of cake, disposes of remaining portion. Supper: lean protein and vegetables Snack: none Beverages: water, sparkling water, unsweetened tea.  Nutrition Care Education: Topics covered: weight control Basic nutrition:  appropriate nutrient balance, appropriate meal and snack schedule   Weight control: non food factors that can influence weight, particularly stress, importance of stress management exercises; nutritious options for small, simple meals/ snacks when not hungry.   Nutritional Diagnosis:  [place]-2.2 Altered nutrition-related laboratory As related to fatty liver.  As evidenced by elevated liver enzymes and biopsy results per patient report. -3.3 Overweight/obesity As related to history of excess calories and inactivity, current  stressors.  As evidenced by patient with BMI of 37.8, following dietary guidelines for ongoing weight loss.  Intervention:   Discussion and instruction as noted above.  Reviewed patient's progress since previous visit.   Updated goals to allow for simple options when not hungry.   Patient requested next visit in 2 months.   Education Materials given:  . Smart Snacking handout . Goals/ instructions   Learner/ who was taught:  . Patient    Level of understanding: Marland Kitchen Verbalizes/ demonstrates competency   Demonstrated degree of understanding via:   Teach back Learning barriers: . None  Willingness to learn/ readiness for change: . Eager, change in progress  Monitoring and Evaluation:  Dietary intake, exercise, and body weight      follow up: 10/07/18

## 2018-08-30 ENCOUNTER — Ambulatory Visit: Payer: No Typology Code available for payment source | Admitting: Family Medicine

## 2018-09-27 ENCOUNTER — Other Ambulatory Visit: Payer: Self-pay

## 2018-09-27 ENCOUNTER — Ambulatory Visit: Payer: Self-pay | Admitting: *Deleted

## 2018-09-27 ENCOUNTER — Ambulatory Visit
Admission: EM | Admit: 2018-09-27 | Discharge: 2018-09-27 | Disposition: A | Discharge status: Home / Self Care | Payer: No Typology Code available for payment source | Attending: Family Medicine | Admitting: Family Medicine

## 2018-09-27 ENCOUNTER — Ambulatory Visit: Payer: No Typology Code available for payment source | Admitting: Family Medicine

## 2018-09-27 DIAGNOSIS — G5621 Lesion of ulnar nerve, right upper limb: Secondary | ICD-10-CM

## 2018-09-27 DIAGNOSIS — S46911A Strain of unspecified muscle, fascia and tendon at shoulder and upper arm level, right arm, initial encounter: Secondary | ICD-10-CM | POA: Diagnosis not present

## 2018-09-27 MED ORDER — PREDNISONE 20 MG PO TABS: 20.0000 mg | ORAL_TABLET | Freq: Every day | ORAL | 0 refills | Status: DC

## 2018-09-27 MED ORDER — HYDROCODONE-ACETAMINOPHEN 5-325 MG PO TABS: ORAL_TABLET | ORAL | 0 refills | Status: DC

## 2018-09-27 MED ORDER — CYCLOBENZAPRINE HCL 10 MG PO TABS: 10.0000 mg | ORAL_TABLET | Freq: Every day | ORAL | 0 refills | Status: DC

## 2018-09-27 NOTE — Telephone Encounter (Signed)
Swelling in the arm and fingers needs a visit TODAY with urgent care at [facility] in [facility] or the ER; she could have a blood clot (DVT); this is not an appropriate telehealth visit

## 2018-09-27 NOTE — Discharge Instructions (Signed)
Rest , ice, tylenol

## 2018-09-27 NOTE — Telephone Encounter (Signed)
Right arm pain with swelling in arms and fingers. Hx of hypertension Missed a dose of b/p medication on Friday. Swelling in arm and fingers. She thought it was from working in the yard on Saturday. She took Tylenol then and the pain went away. But does not seem like Tylenol is working now. She now feels like it may be her b/p. This morning 152/89 and 136/87 with HR 94-87. She denies weakness, chest pain, shortness of breath, or severe headache. She states she has a nagging headache and stopped up ear. She has an appointment scheduled for tomorrow but would like to have a virtual appointment today. Attempted to notify the office at [facility] for an appointment. Advised patient that she would get a call back regarding a virtual appointment, pt voiced understanding. Contacted [facility] and flow will contact patient regarding an appointment. Advised to call 911 if symptoms increased including chest pain and other cardiac symptoms. Pt voiced understanding. Routing to flow at [facility].   Reason for Disposition . [1] Taking BP medications AND [2] feels is having side effects (e.g., impotence, cough, dizzy upon standing)  Answer Assessment - Initial Assessment Questions 1. BLOOD PRESSURE: "What is the blood pressure?" "Did you take at least two measurements 5 minutes apart?"     152/89 HR 94  And 136/87 HR 87 2. ONSET: "When did you take your blood pressure?"     This morning 3. HOW: "How did you obtain the blood pressure?" (e.g., visiting nurse, automatic home BP monitor)     Automatic home BP 4. HISTORY: "Do you have a history of high blood pressure?"     yes 5. MEDICATIONS: "Are you taking any medications for blood pressure?" "Have you missed any doses recently?"     Takes BP med and missed one dose taking medication 6. OTHER SYMPTOMS: "Do you have any symptoms?" (e.g., headache, chest pain, blurred vision, difficulty breathing, weakness)     Dizziness, pain in  right arm, right ear stopped up, swollen fingers in the right hand, nagging headache  today she feels like  it is from allergies 7. PREGNANCY: "Is there any chance you are pregnant?" "When was your last menstrual period?"     No had hysterectomy  Protocols used: HIGH BLOOD PRESSURE-A-AH

## 2018-09-27 NOTE — Telephone Encounter (Signed)
Gave patient appt today 09-27-2018 with Lada ( doximitry)

## 2018-09-27 NOTE — ED Triage Notes (Signed)
Patient complains of right arm pain that started on Friday. Patient states that area hurts right below shoulder and radiates through arm.

## 2018-09-27 NOTE — Telephone Encounter (Signed)
Patient notified and stated she will go to urgent care in [facility] asap.

## 2018-09-28 ENCOUNTER — Encounter: Payer: Self-pay | Admitting: Family Medicine

## 2018-09-28 ENCOUNTER — Emergency Department: Payer: No Typology Code available for payment source

## 2018-09-28 ENCOUNTER — Other Ambulatory Visit: Payer: Self-pay

## 2018-09-28 ENCOUNTER — Emergency Department
Admission: EM | Admit: 2018-09-28 | Discharge: 2018-09-28 | Disposition: A | Discharge status: Home / Self Care | Payer: No Typology Code available for payment source | Attending: Emergency Medicine | Admitting: Emergency Medicine

## 2018-09-28 ENCOUNTER — Encounter: Payer: Self-pay | Admitting: Emergency Medicine

## 2018-09-28 ENCOUNTER — Ambulatory Visit (INDEPENDENT_AMBULATORY_CARE_PROVIDER_SITE_OTHER): Payer: No Typology Code available for payment source | Admitting: Family Medicine

## 2018-09-28 DIAGNOSIS — I10 Essential (primary) hypertension: Secondary | ICD-10-CM | POA: Diagnosis not present

## 2018-09-28 DIAGNOSIS — E119 Type 2 diabetes mellitus without complications: Secondary | ICD-10-CM | POA: Diagnosis not present

## 2018-09-28 DIAGNOSIS — M79601 Pain in right arm: Secondary | ICD-10-CM | POA: Diagnosis not present

## 2018-09-28 DIAGNOSIS — J45909 Unspecified asthma, uncomplicated: Secondary | ICD-10-CM | POA: Insufficient documentation

## 2018-09-28 DIAGNOSIS — F419 Anxiety disorder, unspecified: Secondary | ICD-10-CM | POA: Insufficient documentation

## 2018-09-28 DIAGNOSIS — Z79899 Other long term (current) drug therapy: Secondary | ICD-10-CM | POA: Insufficient documentation

## 2018-09-28 DIAGNOSIS — F329 Major depressive disorder, single episode, unspecified: Secondary | ICD-10-CM | POA: Insufficient documentation

## 2018-09-28 DIAGNOSIS — J4530 Mild persistent asthma, uncomplicated: Secondary | ICD-10-CM | POA: Diagnosis not present

## 2018-09-28 DIAGNOSIS — R2231 Localized swelling, mass and lump, right upper limb: Secondary | ICD-10-CM | POA: Diagnosis present

## 2018-09-28 DIAGNOSIS — R0789 Other chest pain: Secondary | ICD-10-CM

## 2018-09-28 DIAGNOSIS — R609 Edema, unspecified: Secondary | ICD-10-CM

## 2018-09-28 MED ORDER — ALBUTEROL SULFATE HFA 108 (90 BASE) MCG/ACT IN AERS: 1.0000 | INHALATION_SPRAY | RESPIRATORY_TRACT | 1 refills | Status: DC | PRN

## 2018-09-28 MED ORDER — FLUTICASONE PROPIONATE HFA 110 MCG/ACT IN AERO: 2.0000 | INHALATION_SPRAY | Freq: Two times a day (BID) | RESPIRATORY_TRACT | 12 refills | Status: DC

## 2018-09-28 NOTE — ED Triage Notes (Signed)
Pt presents to ED via POV with c/o R arm swelling/pain, was dx with inflammation yesterday by UC, chest tightness that is intermittent and substernal, and sore throat that started this morning. Pt states was sent by PCP.

## 2018-09-28 NOTE — Assessment & Plan Note (Signed)
Patient believes her symptoms are asthma-related, so I have taken the inititative to call in a controller corticosteroid; if she does have a blood clot, start Flovent already sent in; may start with the prednisone; she was sent to the ER so will plan to have another visit to follow-up with her in two weeks to see if symptoms are improving as long as nothing else found in ER; if so, follow-up per their recommendation; discussed risk of COVID-19; I don't know if her current symptoms suggest COVID-19, but she has had multiple trips out in public lately; urged her to wear mask, especially going to ER, use every precaution, don't touch face, etc.; without fever and significant cough, less likely to be COVID-19 and more likely to be PE or asthma (but I cannot test obviously)

## 2018-09-28 NOTE — Progress Notes (Signed)
LMP 09/29/2016 Comment: partial   Subjective:    Patient ID: Alisha Kim, female    DOB: 12/02/1970, 48 y.o.   MRN: [id number]  HPI: Alisha Kim is a 48 y.o. female  Chief Complaint  Patient presents with  . Follow-up  . Hoarse    scratchy throat with hoarseness, onset today    HPI Virtual Visit via Telephone/Video Note   I connected with the patient via:  Telephone and then Doximity video I verified that I am speaking with the correct person using two identifiers.  Call started: 4:10 pm phone, 4:13 video Call terminated: 4:43 pm Total length of call: 30 minutes   I discussed the limitations, risks, and privacy concerns of performing an evaluation and management service by telephone and the availability of in-person appointments. I explained that he/she may be responsible for charges related to this service. The patient expressed understanding and agreed to proceed.  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; CMA note says that patient had right arm pain that started on Friday; hurts below the shoulder and radiates through the arm   Starting on Friday, she had pain in her arm; took tylenol at night and by morning it would be fine; Monday night, it was hurting really bad; was planting flowers in her flower bed; arm swelled up and fingers swelled up; she had inflammation in her arm, maybe strained muscle that swelled up and pressed on nerve; pain was a 7 out of 10; it doesn't hurt as bad today; she started prednisone and vicodin; prednisone usually does help her "asthma" but she is still having chest heaviness; he also gave her cyclobenzaprine, muscle relaxer at bedtime; he checked her and did not think she had a blood clot;  "pain is a lot better" today; it was red yesterday but the fingers are going back to normal; he noticed how swollen how fingers her were yesterday; she has a scratchy throat and a little hoarseness this morning; chest felt tight a little bit, getting better by the end of the day; not sure if asthma is acting up; she thinks it is the asthma; April is the worst month; used SABA about 3 weeks ago; thinks it is asthma; no known exposure to COVID-19 patients; goes occasionally to the grocery store, [place] [employer] and [employer]; she is aware that the virus lives on packaging; she wipes down everything; constantly washes her hands; hand sanitizer; she will cut out fast food going forward; no fever, not checking but will start checking temp 2-3 x a day; throat is scratchy; when she eats, it burns a little bit; she doesn't have much appetite; scratchy, slight pains in the chest but that happens when asthma is coming on; tightness is a description; she has had this befeore, she usually experiences when asthma comes on; however, she does not have much cough or wheezing; she equates chest heaviness to her "asthma"; she used to take controller medicine; not coughing very much, coughing some; she does not think she has a blood clot; she has not been wheezing; she just has chest heaviness and pressure; she feels like she can get a good breath; feels like the back of her throat is burning and feeling pressure on top of the chest, pointing  to upper chest and using whole palm; not much appetite  Her uncle died from a blood clot that spread "to his heart" (PE most likely); he had been sick, fell and hurt himself; he was hospitalized and couldn't find out what was wrong; they said a clot traveled  She has been sitting a lot; working in her office and sitting a lot; no hormonal OCP; no much activity  Depression screen [facility] 2/9 07/07/2018 03/01/2018 03/01/2018 09/28/2017 08/28/2017  Decreased Interest 0 0 0 0 0   Down, Depressed, Hopeless 0 0 0 0 0  PHQ - 2 Score 0 0 0 0 0  Altered sleeping - 0 - - -  Tired, decreased energy - 0 - - -  Change in appetite - 0 - - -  Feeling bad or failure about yourself  - 0 - - -  Trouble concentrating - 0 - - -  Moving slowly or fidgety/restless - 0 - - -  Suicidal thoughts - 0 - - -  PHQ-9 Score - 0 - - -   Fall Risk  09/28/2018 08/05/2018 07/07/2018 03/01/2018 09/28/2017  Falls in the past year? 0 0 0 No No    Relevant past medical, surgical, family and social history reviewed Past Medical History:  Diagnosis Date  . Adenomyosis 09/2015  . Allergy   . Anemia    history of  . Anxiety    controlled;   . Asthma   . Depression with anxiety   . Diabetes mellitus without complication ([employer])    was told she was pre-diabetic  . Fibroids 08/14/2016   [place] March 2018  . History of Helicobacter pylori infection 08/08/2016   2004  . 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 Hypertension    controlled with medication;   . Pre-diabetes    Past Surgical History:  Procedure Laterality Date  . ABDOMINAL HYSTERECTOMY    . COLONOSCOPY WITH PROPOFOL N/A 05/17/2018   Procedure: COLONOSCOPY WITH PROPOFOL;  Surgeon: Lin Landsman, MD;  Location: [facility] ENDOSCOPY;  Service: Gastroenterology;  Laterality: N/A;  . DENTAL SURGERY    . NO PAST SURGERIES    . TONSILLECTOMY    . VAGINAL HYSTERECTOMY Bilateral 09/29/2016   Procedure: HYSTERECTOMY VAGINAL WITH BILATERAL SALPINGECTOMY;  Surgeon: [name], MD;  Location: [facility] ORS;  Service: Gynecology;  Laterality: Bilateral;   Family History  Problem Relation Age of Onset  . Alcohol abuse Mother   . Arthritis Mother   . Asthma Mother   . Depression Mother   . Drug abuse Mother   . Hypertension Mother   . Mental illness Mother   . Cancer Mother        breast and lung  . Diabetes Mother   . Breast cancer Mother 51  . Alcohol abuse Father   . Drug abuse Father   . Cancer Father        lung  . Hypertension Brother   .  Hearing loss Maternal Aunt   . Hypertension Maternal Aunt   . Stroke Maternal Aunt   . Alzheimer's disease Maternal Aunt   . Breast cancer Maternal Aunt        mat great aunt  . Cancer Maternal Grandmother        cervical  . Heart disease Maternal Grandmother   . Hypertension Maternal Grandmother    Social History   Tobacco Use  . Smoking status: Never Smoker  . Smokeless tobacco: Never Used  Substance Use Topics  . Alcohol use:  Not Currently    Alcohol/week: 0.0 standard drinks    Comment: seldom use  . Drug use: No     Office Visit from 09/28/2018 in [facility]  AUDIT-C Score  0      Interim medical history since last visit reviewed. Allergies and medications reviewed  Review of Systems Per HPI unless specifically indicated above     Objective:    LMP 09/29/2016 Comment: partial  Wt Readings from Last 3 Encounters:  09/27/18 220 lb (99.8 kg)  08/05/18 234 lb (106.1 kg)  07/07/18 233 lb 1.6 oz (105.7 kg)   (not sure if weight on 09/27/18 was actually weighed or estimated)  Physical Exam Constitutional:      General: She is not in acute distress.    Appearance: She is obese. She is not diaphoretic.  Eyes:     Extraocular Movements: Extraocular movements intact.     Right eye: Normal extraocular motion.     Left eye: Normal extraocular motion.  Pulmonary:     Effort: No tachypnea, accessory muscle usage or respiratory distress.     Comments: No coughing, no audible wheezing; able to speak in full sentences Skin:    Coloration: Skin is not jaundiced or pale.  Neurological:     Mental Status: She is alert.     Cranial Nerves: No dysarthria.  Psychiatric:        Mood and Affect: Mood is not anxious or depressed.        Speech: Speech is not rapid and pressured, delayed or slurred.     Results for orders placed or performed in visit on 07/05/18  POCT Influenza A/B  Result Value Ref Range   Influenza A, POC Negative Negative    Influenza B, POC Negative Negative      Assessment & Plan:   Problem List Items Addressed This Visit      Respiratory   Mild persistent asthma with allergic rhinitis without complication (Chronic)    Patient believes her symptoms are asthma-related, so I have taken the inititative to call in a controller corticosteroid; if she does have a blood clot, start Flovent already sent in; may start with the prednisone; she was sent to the ER so will plan to have another visit to follow-up with her in two weeks to see if symptoms are improving as long as nothing else found in ER; if so, follow-up per their recommendation; discussed risk of COVID-19; I don't know if her current symptoms suggest COVID-19, but she has had multiple trips out in public lately; urged her to wear mask, especially going to ER, use every precaution, don't touch face, etc.; without fever and significant cough, less likely to be COVID-19 and more likely to be PE or asthma (but I cannot test obviously)      Relevant Medications   fluticasone (FLOVENT HFA) 110 MCG/ACT inhaler   albuterol (PROAIR HFA) 108 (90 Base) MCG/ACT inhaler    Other Visit Diagnoses    Chest heaviness    -  Primary   I'm not comfortable calling her chest heaviness without wheezing or cough just asthma; ddx includes PE; she will go now to ER; report called to ER      Disposition: patient was not in respiratory distress, felt like she was breathing okay; did not think she had to go by ambulance; "no ma'am I'll be fine"  Follow up plan: No follow-ups on file.  Meds ordered this encounter  Medications  . fluticasone (FLOVENT  HFA) 110 MCG/ACT inhaler    Sig: Inhale 2 puffs into the lungs 2 (two) times a day. Rinse mouth out after 2nd puff; use every day    Dispense:  1 Inhaler    Refill:  12  . albuterol (PROAIR HFA) 108 (90 Base) MCG/ACT inhaler    Sig: Inhale 1-2 puffs into the lungs every 4 (four) hours as needed for wheezing or shortness of breath.     Dispense:  1 Inhaler    Refill:  1   No orders of the defined types were placed in this encounter.

## 2018-09-28 NOTE — ED Provider Notes (Addendum)
[facility] Emergency Department Provider Note  Time seen: 6:01 PM  I have reviewed the triage vital signs and the nursing notes.   HISTORY  Chief Complaint Chest Pain; Sore Throat; and Arm Swelling    HPI Alisha Kim is a 48 y.o. female with a past medical history of anemia, anxiety, diabetes, fibroids, asthma, hypertension, presents to the emergency department with right arm discomfort and swelling.  According to the patient for the past 3 to 4 days she has been experiencing discomfort and swelling in her right arm.  Went to urgent care yesterday and was evaluated discharged with prednisone and some pain medication.  Patient states she had a follow-up appointment with her doctor on the phone today continued to have some discomfort in her right arm and was now experiencing a mild sore throat/hoarse sensation in her throat and a mild sensation of chest tightness which the patient states is fairly normal for her due to asthma.  PCP referred the patient to the emergency department for evaluation.  Here the patient overall appears very well, reassuring vitals, afebrile, no reported fever.  No cough, no known sick contacts.  Past Medical History:  Diagnosis Date  . Adenomyosis 09/2015  . Allergy   . Anemia    history of  . Anxiety    controlled;   . Asthma   . Depression with anxiety   . Diabetes mellitus without complication ([place])    was told she was pre-diabetic  . Fibroids 08/14/2016   [place] March 2018  . History of Helicobacter pylori infection 08/08/2016   2004  . 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 Hypertension    controlled with medication;   . Pre-diabetes     Patient Active Problem List   Diagnosis Date Noted  . Morbid obesity ([place]) 05/31/2018  . Colon cancer screening   . Allergic rhinitis 03/01/2018  . Cervical stenosis of spinal canal 08/28/2017  . S/P vaginal hysterectomy 09/29/2016  . History of Helicobacter pylori infection 08/08/2016  .  Microcytosis 08/01/2016  . Heel spur, left 04/07/2016  . Bilateral foot pain 04/04/2016  . Anxiety 04/04/2016  . Hypertension, goal below 140/90 07/24/2015  . Bilateral hip pain 11/17/2014  . Atopic dermatitis 11/17/2014  . Low serum vitamin D 11/17/2014  . Pre-diabetes 11/17/2014  . Depression with anxiety 11/17/2014  . Mild persistent asthma with allergic rhinitis without complication [id number]/4967    Past Surgical History:  Procedure Laterality Date  . ABDOMINAL HYSTERECTOMY    . COLONOSCOPY WITH PROPOFOL N/A 05/17/2018   Procedure: COLONOSCOPY WITH PROPOFOL;  Surgeon: Lin Landsman, MD;  Location: [facility] ENDOSCOPY;  Service: Gastroenterology;  Laterality: N/A;  . DENTAL SURGERY    . NO PAST SURGERIES    . TONSILLECTOMY    . VAGINAL HYSTERECTOMY Bilateral 09/29/2016   Procedure: HYSTERECTOMY VAGINAL WITH BILATERAL SALPINGECTOMY;  Surgeon: [name], MD;  Location: [facility] ORS;  Service: Gynecology;  Laterality: Bilateral;    Prior to Admission medications   Medication Sig Start Date End Date Taking? Authorizing Provider  albuterol (PROAIR HFA) 108 (90 Base) MCG/ACT inhaler Inhale 1-2 puffs into the lungs every 4 (four) hours as needed for wheezing or shortness of breath. 09/28/18   Lada, Satira Anis, MD  amLODipine (NORVASC) 5 MG tablet Take 1 tablet (5 mg total) by mouth every evening. 03/01/18   Lada, Satira Anis, MD  Cholecalciferol (VITAMIN D3 GUMMIES ADULT PO) Take by mouth.    [provider]  cyclobenzaprine (FLEXERIL)  10 MG tablet Take 1 tablet (10 mg total) by mouth at bedtime. 09/27/18   Norval Gable, MD  fluticasone (FLONASE) 50 MCG/ACT nasal spray Place 2 sprays into both nostrils daily. 07/05/18   Poulose, Bethel Born, NP  fluticasone (FLOVENT HFA) 110 MCG/ACT inhaler Inhale 2 puffs into the lungs 2 (two) times a day. Rinse mouth out after 2nd puff; use every day 09/28/18   Arnetha Courser, MD  HYDROcodone-acetaminophen (NORCO/VICODIN) 5-325 MG tablet 1-2 tabs po qd  prn 09/27/18   Norval Gable, MD  hydrOXYzine (ATARAX/VISTARIL) 25 MG tablet Take 1 tablet (25 mg total) by mouth every 8 (eight) hours as needed for itching. 03/02/17   Lada, Satira Anis, MD  PAZEO 0.7 % SOLN Place 2 drops into both eyes as needed. 08/28/17   [provider]  predniSONE (DELTASONE) 20 MG tablet Take 1 tablet (20 mg total) by mouth daily. 09/27/18   Norval Gable, MD    Allergies  Allergen Reactions  . Meloxicam Other (See Comments)    Felt horrible, headache, bloating, back pain, SHOB; no rash  . Zithromax [Azithromycin] Swelling    Tongue   . Pseudoephedrine-Guaifenesin Swelling    Of tongue  . Shellfish Allergy Rash  . Shellfish-Derived Products Rash    Family History  Problem Relation Age of Onset  . Alcohol abuse Mother   . Arthritis Mother   . Asthma Mother   . Depression Mother   . Drug abuse Mother   . Hypertension Mother   . Mental illness Mother   . Cancer Mother        breast and lung  . Diabetes Mother   . Breast cancer Mother 74  . Alcohol abuse Father   . Drug abuse Father   . Cancer Father        lung  . Hypertension Brother   . Hearing loss Maternal Aunt   . Hypertension Maternal Aunt   . Stroke Maternal Aunt   . Alzheimer's disease Maternal Aunt   . Breast cancer Maternal Aunt        mat great aunt  . Cancer Maternal Grandmother        cervical  . Heart disease Maternal Grandmother   . Hypertension Maternal Grandmother     Social History Social History   Tobacco Use  . Smoking status: Never Smoker  . Smokeless tobacco: Never Used  Substance Use Topics  . Alcohol use: Not Currently    Alcohol/week: 0.0 standard drinks    Comment: seldom use  . Drug use: No    Review of Systems Constitutional: Negative for fever. ENT: Mild hoarseness. Cardiovascular: Negative for chest pain.  Slight tightness. Respiratory: Negative for shortness of breath.  Negative for cough. Gastrointestinal: Negative for abdominal pain, vomiting   Musculoskeletal: Negative for musculoskeletal complaints Skin: Negative for skin complaints  Neurological: Negative for headache All other ROS negative  ____________________________________________   PHYSICAL EXAM:  VITAL SIGNS: ED Triage Vitals  Enc Vitals Group     BP 09/28/18 1742 128/80     Pulse Rate 09/28/18 1742 85     Resp --      Temp 09/28/18 1742 98.1 F (36.7 C)     Temp Source 09/28/18 1742 Oral     SpO2 09/28/18 1742 100 %     Weight 09/28/18 1738 220 lb (99.8 kg)     Height 09/28/18 1738 5\' 6"  (1.676 m)     Head Circumference --      Peak Flow --  Pain Score 09/28/18 1738 4     Pain Loc --      Pain Edu? --      Excl. in [place]? --    Constitutional: Alert and oriented. Well appearing and in no distress. Eyes: Normal exam ENT      Head: Normocephalic and atraumatic.      Mouth/Throat: Mucous membranes are moist. Cardiovascular: Normal rate, regular rhythm.  Respiratory: Normal respiratory effort without tachypnea nor retractions. Breath sounds are clear  Gastrointestinal: Soft and nontender. No distention. Musculoskeletal: Nontender with normal range of motion in all extremities.  Right upper extremity appears normal my examination neurovascular intact with 2+ radial pulse, great range of motion in all joints.  No objective swelling currently. Neurologic:  Normal speech and language. No gross focal neurologic deficits  Skin:  Skin is warm, dry and intact.  Psychiatric: Mood and affect are normal.   ____________________________________________  RADIOLOGY  Venous ultrasound negative for DVT. Chest x-ray appears normal on my evaluation.   EKG viewed and interpreted by myself shows a normal sinus rhythm at 76 bpm with a narrow QRS, normal axis, normal intervals, and no concerning ST changes. ____________________________________________   INITIAL IMPRESSION / ASSESSMENT AND PLAN / ED COURSE  Pertinent labs & imaging results that were available  during my care of the patient were reviewed by me and considered in my medical decision making (see chart for details).   Patient presents emergency department for 3 to 4 days of right upper extremity swelling and discomfort.  Patient went to urgent care yesterday and was prescribed steroids and pain medication, states the arm is much better today states the pain is minimal and the swelling is come down significantly per patient.  No history of DVT in the past, does state a uncle of hers had a blood clot she believes.  Denies any estrogen use.  Patient's vitals are reassuring, PERC negative.  We will obtain a right upper extremity duplex ultrasound as a precaution.  Given the patient has mild chest tightness however she relates this to asthma we will obtain a chest x-ray as a precaution as well.  Patient agreeable to plan of care.  Patient's work-up is reassuring, ultrasound is negative patient will continue taking her prednisone and pain medication if needed.  Chest x-ray is clear.  We will discharge patient with PCP follow-up.  Alisha Kim was evaluated in Emergency Department on 09/28/2018 for the symptoms described in the history of present illness. She was evaluated in the context of the global COVID-19 pandemic, which necessitated consideration that the patient might be at risk for infection with the SARS-CoV-2 virus that causes COVID-19. Institutional protocols and algorithms that pertain to the evaluation of patients at risk for COVID-19 are in a state of rapid change based on information released by regulatory bodies including the CDC and federal and state organizations. These policies and algorithms were followed during the patient's care in the ED.  ____________________________________________   FINAL CLINICAL IMPRESSION(S) / ED DIAGNOSES  Right upper extremity pain   Harvest Dark, MD 09/28/18 Lanetta Inch    Harvest Dark, MD 10/12/18 [id number]

## 2018-09-28 NOTE — ED Notes (Signed)
Patient discharged home in stable condition, ambulatory and VSS. Patient picked up by husband in [place]

## 2018-10-04 ENCOUNTER — Encounter: Payer: Self-pay | Admitting: Dietician

## 2018-10-04 NOTE — Progress Notes (Signed)
Ms. Alisha Kim has cancelled her MNT follow-up appointment for 10/07/18. She will reschedule at a later time.

## 2018-10-07 ENCOUNTER — Ambulatory Visit: Payer: Self-pay | Admitting: Dietician

## 2018-10-08 ENCOUNTER — Encounter: Payer: Self-pay | Admitting: Nurse Practitioner

## 2018-10-08 ENCOUNTER — Other Ambulatory Visit: Payer: Self-pay

## 2018-10-08 ENCOUNTER — Ambulatory Visit (INDEPENDENT_AMBULATORY_CARE_PROVIDER_SITE_OTHER): Payer: No Typology Code available for payment source | Admitting: Nurse Practitioner

## 2018-10-08 ENCOUNTER — Encounter: Payer: Self-pay | Admitting: Family Medicine

## 2018-10-08 DIAGNOSIS — R0789 Other chest pain: Secondary | ICD-10-CM | POA: Diagnosis not present

## 2018-10-08 DIAGNOSIS — J4530 Mild persistent asthma, uncomplicated: Secondary | ICD-10-CM | POA: Diagnosis not present

## 2018-10-08 DIAGNOSIS — F419 Anxiety disorder, unspecified: Secondary | ICD-10-CM | POA: Diagnosis not present

## 2018-10-08 DIAGNOSIS — M25611 Stiffness of right shoulder, not elsewhere classified: Secondary | ICD-10-CM | POA: Diagnosis not present

## 2018-10-08 MED ORDER — HYDROXYZINE HCL 25 MG PO TABS: 25.0000 mg | ORAL_TABLET | Freq: Three times a day (TID) | ORAL | 2 refills | Status: DC | PRN

## 2018-10-08 MED ORDER — DULOXETINE HCL 30 MG PO CPEP: ORAL_CAPSULE | ORAL | 3 refills | Status: DC

## 2018-10-08 NOTE — Progress Notes (Signed)
Virtual Visit via Video Note  I connected with Alisha Kim  on 10/08/18 at  2:40 PM EDT by a video enabled telemedicine application and verified that I am speaking with the correct person using two identifiers.   Staff discussed the limitations of evaluation and management by telemedicine and the availability of in person appointments. The patient expressed understanding and agreed to proceed.  Patient location: home  My location: home office Other people present:  none HPI  Patient was seen in urgent care for right arm pain and swelling on 09/27/2018 discharged with prednisone. Saw PCP the following day who referred her to the ER do to arm pain and chest tightness. [place] of right arm showed no evidence of DVT. Chest Xray showed no active disease. EKG was normal sinus rhythm with HR 78bpm.   Patient states she continues to have intermittent chest pressure. States central non-radiating chest pressure last for a few minutes then she notes she gets wheezing and is relieved. She finished the prednisone course for a week and had significant relief then.   Endorses right arm stiffness ongoing for 2 weeks. States muscle relaxers have provided temporary relief especially at night. States she took a Vicodin but made her feel very weird so she stopped taking it. Prednisone helped increase range of motion    GAD 7 : Generalized Anxiety Score 10/08/2018  Nervous, Anxious, on Edge 3  Control/stop worrying 3  Worry too much - different things 3  Trouble relaxing 2  Restless 0  Easily annoyed or irritable 2  Afraid - awful might happen 0  Total GAD 7 Score 13  Anxiety Difficulty Somewhat difficult   Patient endorses extreme anxiety.  States she feels like she is going to have mental breakdown.  Denies depression or suicidal ideation.  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  She is tearful presently.  Does not have a counselor but is open to  seeing someone.  Has been on a daily medication in the past but does not recall what it was, states it worked well for her during the time.  She needed it.  PHQ2/9: Depression screen [facility] 2/9 10/08/2018 07/07/2018 03/01/2018 03/01/2018 09/28/2017  Decreased Interest 0 0 0 0 0  Down, Depressed, Hopeless 1 0 0 0 0  PHQ - 2 Score 1 0 0 0 0  Altered sleeping 1 - 0 - -  Tired, decreased energy 3 - 0 - -  Change in appetite 1 - 0 - -  Feeling bad or failure about yourself  0 - 0 - -  Trouble concentrating 1 - 0 - -  Moving slowly or fidgety/restless 0 - 0 - -  Suicidal thoughts 0 - 0 - -  PHQ-9 Score 7 - 0 - -  Difficult doing work/chores Somewhat difficult - - - -   PHQ reviewed.feels it is related more to anxiety.  Patient Active Problem List   Diagnosis Date Noted  . Morbid obesity ([place]) 05/31/2018  . Colon cancer screening   . Allergic rhinitis 03/01/2018  . Cervical stenosis of spinal canal 08/28/2017  . S/P vaginal hysterectomy 09/29/2016  . History of Helicobacter pylori infection 08/08/2016  . Microcytosis 08/01/2016  . Heel spur, left 04/07/2016  . Bilateral foot pain 04/04/2016  . Anxiety 04/04/2016  . Hypertension, goal below 140/90 07/24/2015  . Bilateral hip pain 11/17/2014  . Atopic dermatitis 11/17/2014  . Low serum vitamin D 11/17/2014  . Pre-diabetes  11/17/2014  . Depression with anxiety 11/17/2014  . Mild persistent asthma with allergic rhinitis without complication 16/03/9603    Past Medical History:  Diagnosis Date  . Adenomyosis 09/2015  . Allergy   . Anemia    history of  . Anxiety    controlled;   . Asthma   . Depression with anxiety   . Diabetes mellitus without complication ([place])    was told she was pre-diabetic  . Fibroids 08/14/2016   [place] March 2018  . History of Helicobacter pylori infection 08/08/2016   2004  . 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 Hypertension    controlled with medication;   . Pre-diabetes     Past Surgical History:  Procedure  Laterality Date  . ABDOMINAL HYSTERECTOMY    . COLONOSCOPY WITH PROPOFOL N/A 05/17/2018   Procedure: COLONOSCOPY WITH PROPOFOL;  Surgeon: Lin Landsman, MD;  Location: [facility] ENDOSCOPY;  Service: Gastroenterology;  Laterality: N/A;  . DENTAL SURGERY    . NO PAST SURGERIES    . TONSILLECTOMY    . VAGINAL HYSTERECTOMY Bilateral 09/29/2016   Procedure: HYSTERECTOMY VAGINAL WITH BILATERAL SALPINGECTOMY;  Surgeon: [name], MD;  Location: [facility] ORS;  Service: Gynecology;  Laterality: Bilateral;    Social History   Tobacco Use  . Smoking status: Never Smoker  . Smokeless tobacco: Never Used  Substance Use Topics  . Alcohol use: Not Currently    Alcohol/week: 0.0 standard drinks    Comment: seldom use     Current Outpatient Medications:  .  albuterol (PROAIR HFA) 108 (90 Base) MCG/ACT inhaler, Inhale 1-2 puffs into the lungs every 4 (four) hours as needed for wheezing or shortness of breath., Disp: 1 Inhaler, Rfl: 1 .  amLODipine (NORVASC) 5 MG tablet, Take 1 tablet (5 mg total) by mouth every evening., Disp: 90 tablet, Rfl: 3 .  Cholecalciferol (VITAMIN D3 GUMMIES ADULT PO), Take by mouth., Disp: , Rfl:  .  cyclobenzaprine (FLEXERIL) 10 MG tablet, Take 1 tablet (10 mg total) by mouth at bedtime., Disp: 30 tablet, Rfl: 0 .  fluticasone (FLONASE) 50 MCG/ACT nasal spray, Place 2 sprays into both nostrils daily., Disp: 16 g, Rfl: 6 .  fluticasone (FLOVENT HFA) 110 MCG/ACT inhaler, Inhale 2 puffs into the lungs 2 (two) times a day. Rinse mouth out after 2nd puff; use every day, Disp: 1 Inhaler, Rfl: 12 .  HYDROcodone-acetaminophen (NORCO/VICODIN) 5-325 MG tablet, 1-2 tabs po qd prn, Disp: 6 tablet, Rfl: 0 .  hydrOXYzine (ATARAX/VISTARIL) 25 MG tablet, Take 1 tablet (25 mg total) by mouth every 8 (eight) hours as needed for itching., Disp: 30 tablet, Rfl: 2 .  PAZEO 0.7 % SOLN, Place 2 drops into both eyes as needed., Disp: , Rfl: 2 .  predniSONE (DELTASONE) 20 MG tablet, Take 1 tablet (20  mg total) by mouth daily. (Patient not taking: Reported on 10/08/2018), Disp: 7 tablet, Rfl: 0  Allergies  Allergen Reactions  . Meloxicam Other (See Comments)    Felt horrible, headache, bloating, back pain, SHOB; no rash  . Zithromax [Azithromycin] Swelling    Tongue   . Pseudoephedrine-Guaifenesin Swelling    Of tongue  . Shellfish Allergy Rash  . Shellfish-Derived Products Rash    ROS   No other specific complaints in a complete review of systems (except as listed in HPI above).  Objective  There were no vitals filed for this visit.  There is no height or weight on file to calculate BMI.  Nursing Note and Vital  Signs reviewed.  Physical Exam  Constitutional: Patient tearful   HENT: Head: Normocephalic and atraumatic. Pulmonary/Chest: Effort normal  Musculoskeletal: Normal range of motion,  Neurological: alert and oriented. speech is normal. .  Psychiatric: Patient is tearful and anxious.     Assessment & Plan  1. Anxiety Will start cymbalta daily to help with anxiety and possibly some improvement of pain. Take hydroxyzine PRN. Work note provided to have her off until Tuesday to allow her time to set up counseling and debrief for her mental health.  - hydrOXYzine (ATARAX/VISTARIL) 25 MG tablet; Take 1 tablet (25 mg total) by mouth every 8 (eight) hours as needed for itching.  Dispense: 30 tablet; Refill: 2 - DULoxetine (CYMBALTA) 30 MG capsule; Take 1 tablet daily for one week then take 2 tablets daily thereafter.  Dispense: 37 capsule; Refill: 3  2. Chest heaviness Appears to be asthma related with anxiety component. Discussed ER precautions  3. Mild persistent asthma with allergic rhinitis without complication Continue daily med and PRN albuterol   4. Shoulder stiffness, right Continue muscle relaxer, seems like frozen shoulder. Ice, rest, slowly increase ROM, if not improving re-assess   Follow Up Instructions:   follow up in 1 week.  I discussed the  assessment and treatment plan with the patient. The patient was provided an opportunity to ask questions and all were answered. The patient agreed with the plan and demonstrated an understanding of the instructions.   The patient was advised to call back or seek an in-person evaluation if the symptoms worsen or if the condition fails to improve as anticipated.  I provided  25 minutes of non-face-to-face time during this encounter.   Fredderick Severance, NP

## 2018-10-08 NOTE — Patient Instructions (Signed)
Adhesive Capsulitis    Adhesive capsulitis, also called frozen shoulder, causes the shoulder to become stiff and painful to move. This condition happens when there is inflammation of the tendons and ligaments that surround the shoulder joint (shoulder capsule).  What are the causes?  This condition may be caused by:   An injury to your shoulder joint.   Straining your shoulder.   Not moving your shoulder for a period of time. This can happen if your arm was injured or in a sling.   Long-standing conditions, such as:  ? Diabetes.  ? Thyroid problems.  ? Heart disease.  ? Stroke.  ? Rheumatoid arthritis.  ? Lung disease.  In some cases, the cause is not known.  What increases the risk?  You are more likely to develop this condition if you are:   A woman.   Older than 48 years of age.  What are the signs or symptoms?  Symptoms of this condition include:   Pain in your shoulder when you move your arm. There may also be pain when parts of your shoulder are touched. The pain may be worse at night or when you are resting.   A sore or aching shoulder.   The inability to move your shoulder normally.   Muscle spasms.  How is this diagnosed?  This condition is diagnosed with a physical exam and imaging tests, such as an X-ray or MRI.  How is this treated?  This condition may be treated with:   Treatment of the underlying cause or condition.   Medicine. Medicine may be given to relieve pain, inflammation, or muscle spasms.   Steroid injections into the shoulder joint.   Physical therapy. This involves performing exercises to get the shoulder moving again.   Acupuncture. This is a type of treatment that involves stimulating specific points on your body by inserting thin needles through your skin.   Shoulder manipulation. This is a procedure to move the shoulder into another position. It is done after you are given a medicine to make you fall asleep (general anesthetic). The joint may also be injected with salt  water at high pressure to break down scarring.   Surgery. This may be done in severe cases when other treatments have failed.  Although most people recover completely from adhesive capsulitis, some may not regain full shoulder movement.  Follow these instructions at home:  Managing pain, stiffness, and swelling          If directed, put ice on the injured area:  ? Put ice in a plastic bag.  ? Place a towel between your skin and the bag.  ? Leave the ice on for 20 minutes, 2-3 times per day.   If directed, apply heat to the affected area before you exercise. Use the heat source that your health care provider recommends, such as a moist heat pack or a heating pad.  ? Place a towel between your skin and the heat source.  ? Leave the heat on for 20-30 minutes.  ? Remove the heat if your skin turns bright red. This is especially important if you are unable to feel pain, heat, or cold. You may have a greater risk of getting burned.  General instructions   Take over-the-counter and prescription medicines only as told by your health care provider.   If you are being treated with physical therapy, follow instructions from your physical therapist.   Avoid exercises that put a lot of demand on your   shoulder, such as throwing. These exercises can make pain worse.   Keep all follow-up visits as told by your health care provider. This is important.  Contact a health care provider if:   You develop new symptoms.   Your symptoms get worse.  Summary   Adhesive capsulitis, also called frozen shoulder, causes the shoulder to become stiff and painful to move.   You are more likely to have this condition if you are a woman and over age 48.   It is treated with physical therapy, medicines, and sometimes surgery.  This information is not intended to replace advice given to you by your health care provider. Make sure you discuss any questions you have with your health care provider.  Document Released: 03/23/2009 Document  Revised: 10/30/2017 Document Reviewed: 10/30/2017  Elsevier Interactive Patient Education  2019 [employer]vier Inc[employer]

## 2018-10-09 NOTE — ED Provider Notes (Addendum)
[facility]    CSN: [id number] Arrival date & time: 09/27/18  1106     History   Chief Complaint Chief Complaint  Patient presents with  . Arm Pain    right    HPI Alisha Kim is a 48 y.o. female.   48 yo female with a c/o right arm pain for the past 3 days. States area that hurts is right below the shoulder and it radiates through the arm. States feels occasional, intermittent numbness down the back of the arm and elbow down towards the hand. Denies any chest pain, shortness of breath, injuries, fevers, chills, rash.   The history is provided by the patient.  Arm Pain     Past Medical History:  Diagnosis Date  . Adenomyosis 09/2015  . Allergy   . Anemia    history of  . Anxiety    controlled;   . Asthma   . Depression with anxiety   . Diabetes mellitus without complication ([place])    was told she was pre-diabetic  . Fibroids 08/14/2016   [place] March 2018  . History of Helicobacter pylori infection 08/08/2016   2004  . 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 Hypertension    controlled with medication;   . Pre-diabetes     Patient Active Problem List   Diagnosis Date Noted  . Morbid obesity ([place]) 05/31/2018  . Colon cancer screening   . Allergic rhinitis 03/01/2018  . Cervical stenosis of spinal canal 08/28/2017  . S/P vaginal hysterectomy 09/29/2016  . History of Helicobacter pylori infection 08/08/2016  . Microcytosis 08/01/2016  . Heel spur, left 04/07/2016  . Bilateral foot pain 04/04/2016  . Anxiety 04/04/2016  . Hypertension, goal below 140/90 07/24/2015  . Bilateral hip pain 11/17/2014  . Atopic dermatitis 11/17/2014  . Low serum vitamin D 11/17/2014  . Pre-diabetes 11/17/2014  . Depression with anxiety 11/17/2014  . Mild persistent asthma with allergic rhinitis without complication 63/78/5885    Past Surgical History:  Procedure Laterality Date  . ABDOMINAL HYSTERECTOMY    . COLONOSCOPY WITH PROPOFOL N/A 05/17/2018   Procedure:  COLONOSCOPY WITH PROPOFOL;  Surgeon: Lin Landsman, MD;  Location: [facility] ENDOSCOPY;  Service: Gastroenterology;  Laterality: N/A;  . DENTAL SURGERY    . NO PAST SURGERIES    . TONSILLECTOMY    . VAGINAL HYSTERECTOMY Bilateral 09/29/2016   Procedure: HYSTERECTOMY VAGINAL WITH BILATERAL SALPINGECTOMY;  Surgeon: [name], MD;  Location: [facility] ORS;  Service: Gynecology;  Laterality: Bilateral;    OB History    Gravida  1   Para  1   Term  1   Preterm      AB      Living  1     SAB      TAB      Ectopic      Multiple      Live Births  1            Home Medications    Prior to Admission medications   Medication Sig Start Date End Date Taking? Authorizing Provider  amLODipine (NORVASC) 5 MG tablet Take 1 tablet (5 mg total) by mouth every evening. 03/01/18  Yes Lada, Satira Anis, MD  Cholecalciferol (VITAMIN D3 GUMMIES ADULT PO) Take by mouth.   Yes [provider]  fluticasone (FLONASE) 50 MCG/ACT nasal spray Place 2 sprays into both nostrils daily. 07/05/18  Yes Poulose, Bethel Born, NP  PAZEO 0.7 % SOLN Place 2 drops  into both eyes as needed. 08/28/17  Yes [provider]  albuterol (PROAIR HFA) 108 (90 Base) MCG/ACT inhaler Inhale 1-2 puffs into the lungs every 4 (four) hours as needed for wheezing or shortness of breath. 09/28/18   Lada, Satira Anis, MD  cyclobenzaprine (FLEXERIL) 10 MG tablet Take 1 tablet (10 mg total) by mouth at bedtime. 09/27/18   Norval Gable, MD  DULoxetine (CYMBALTA) 30 MG capsule Take 1 tablet daily for one week then take 2 tablets daily thereafter. 10/08/18   Poulose, Bethel Born, NP  fluticasone (FLOVENT HFA) 110 MCG/ACT inhaler Inhale 2 puffs into the lungs 2 (two) times a day. Rinse mouth out after 2nd puff; use every day 09/28/18   Arnetha Courser, MD  HYDROcodone-acetaminophen (NORCO/VICODIN) 5-325 MG tablet 1-2 tabs po qd prn 09/27/18   Norval Gable, MD  hydrOXYzine (ATARAX/VISTARIL) 25 MG tablet Take 1 tablet (25 mg  total) by mouth every 8 (eight) hours as needed for itching. 10/08/18   Poulose, Bethel Born, NP    Family History Family History  Problem Relation Age of Onset  . Alcohol abuse Mother   . Arthritis Mother   . Asthma Mother   . Depression Mother   . Drug abuse Mother   . Hypertension Mother   . Mental illness Mother   . Cancer Mother        breast and lung  . Diabetes Mother   . Breast cancer Mother 55  . Alcohol abuse Father   . Drug abuse Father   . Cancer Father        lung  . Hypertension Brother   . Hearing loss Maternal Aunt   . Hypertension Maternal Aunt   . Stroke Maternal Aunt   . Alzheimer's disease Maternal Aunt   . Breast cancer Maternal Aunt        mat great aunt  . Cancer Maternal Grandmother        cervical  . Heart disease Maternal Grandmother   . Hypertension Maternal Grandmother     Social History Social History   Tobacco Use  . Smoking status: Never Smoker  . Smokeless tobacco: Never Used  Substance Use Topics  . Alcohol use: Not Currently    Alcohol/week: 0.0 standard drinks    Comment: seldom use  . Drug use: No     Allergies   Meloxicam; Zithromax [azithromycin]; Pseudoephedrine-guaifenesin; Shellfish allergy; and Shellfish-derived products   Review of Systems Review of Systems   Physical Exam Triage Vital Signs ED Triage Vitals  Enc Vitals Group     BP 09/27/18 1123 133/84     Pulse Rate 09/27/18 1123 80     Resp 09/27/18 1123 16     Temp 09/27/18 1123 98.2 F (36.8 C)     Temp Source 09/27/18 1123 Oral     SpO2 09/27/18 1123 100 %     Weight 09/27/18 1120 220 lb (99.8 kg)     Height 09/27/18 1120 5\' 6"  (1.676 m)     Head Circumference --      Peak Flow --      Pain Score 09/27/18 1120 7     Pain Loc --      Pain Edu? --      Excl. in [place]? --    No data found.  Updated Vital Signs BP 133/84 (BP Location: Left Arm)   Pulse 80   Temp 98.2 F (36.8 C) (Oral)   Resp 16   Ht 5\' 6"  (1.676 m)  Wt 99.8 kg   LMP  09/29/2016 Comment: partial  SpO2 100%   BMI 35.51 kg/m   Visual Acuity Right Eye Distance:   Left Eye Distance:   Bilateral Distance:    Right Eye Near:   Left Eye Near:    Bilateral Near:     Physical Exam Vitals signs and nursing note reviewed.  Constitutional:      General: She is not in acute distress.    Appearance: She is not toxic-appearing or diaphoretic.  Musculoskeletal:     Right shoulder: Normal.     Right elbow: She exhibits normal range of motion, no swelling, no effusion, no deformity and no laceration. Tenderness (posterior upper arm diffusely) found.     Right wrist: Normal.     Comments: Right upper extremity neurovascularly intact  Neurological:     Mental Status: She is alert.      UC Treatments / Results  Labs (all labs ordered are listed, but only abnormal results are displayed) Labs Reviewed - No data to display  EKG None  Radiology No results found.  Procedures Procedures (including critical care time)  Medications Ordered in UC Medications - No data to display  Initial Impression / Assessment and Plan / UC Course  I have reviewed the triage vital signs and the nursing notes.  Pertinent labs & imaging results that were available during my care of the patient were reviewed by me and considered in my medical decision making (see chart for details).      Final Clinical Impressions(s) / UC Diagnoses   Final diagnoses:  Ulnar neuropathy of right upper extremity  Muscle strain of upper extremity, right, initial encounter     Discharge Instructions     Rest, ice, tylenol    ED Prescriptions    Medication Sig Dispense Auth. Provider   predniSONE (DELTASONE) 20 MG tablet Take 1 tablet (20 mg total) by mouth daily. Patient not taking:  Reported on 10/08/2018 7 tablet Norval Gable, MD   cyclobenzaprine (FLEXERIL) 10 MG tablet Take 1 tablet (10 mg total) by mouth at bedtime. 30 tablet Norval Gable, MD   HYDROcodone-acetaminophen  (NORCO/VICODIN) 5-325 MG tablet 1-2 tabs po qd prn 6 tablet Norval Gable, MD      1.  diagnosis reviewed with patient 2. rx as per orders above; reviewed possible side effects, interactions, risks and benefits  3. Recommend supportive treatment as above 4. Follow-up prn if symptoms worsen or don't improve  Controlled Substance Prescriptions [place] Controlled Substance Registry consulted? Not Applicable   Norval Gable, MD 10/09/18 [place], MD 10/09/18 [phone]

## 2018-10-12 ENCOUNTER — Ambulatory Visit: Payer: No Typology Code available for payment source | Admitting: Family Medicine

## 2018-11-19 ENCOUNTER — Encounter: Payer: Self-pay | Admitting: Dietician

## 2018-11-19 NOTE — Progress Notes (Signed)
Have not heard back from patient to reschedule her cancelled visit from 10/07/18. Sent letter to referring provider.

## 2018-11-25 ENCOUNTER — Other Ambulatory Visit: Payer: Self-pay | Admitting: Nurse Practitioner

## 2018-11-25 DIAGNOSIS — F419 Anxiety disorder, unspecified: Secondary | ICD-10-CM

## 2018-12-20 ENCOUNTER — Telehealth: Payer: Self-pay

## 2018-12-20 DIAGNOSIS — Z1239 Encounter for other screening for malignant neoplasm of breast: Secondary | ICD-10-CM

## 2018-12-20 NOTE — Telephone Encounter (Signed)
Copied from [place] [phone]. Topic: General - Other >> Dec 20, 2018  9:55 AM Burchel, Abbi R wrote: Reason for CRM: Pt states she is due for her mammogram and called to see if orders can be placed with out an appt.  Please call pt at home to advise:  [phone]

## 2019-04-01 ENCOUNTER — Ambulatory Visit (INDEPENDENT_AMBULATORY_CARE_PROVIDER_SITE_OTHER): Payer: No Typology Code available for payment source

## 2019-04-01 ENCOUNTER — Other Ambulatory Visit: Payer: Self-pay

## 2019-04-01 DIAGNOSIS — Z23 Encounter for immunization: Secondary | ICD-10-CM

## 2019-04-28 ENCOUNTER — Ambulatory Visit (INDEPENDENT_AMBULATORY_CARE_PROVIDER_SITE_OTHER): Payer: No Typology Code available for payment source

## 2019-04-28 ENCOUNTER — Ambulatory Visit
Admission: EM | Admit: 2019-04-28 | Discharge: 2019-04-28 | Disposition: A | Discharge status: Home / Self Care | Payer: No Typology Code available for payment source | Attending: Family Medicine | Admitting: Family Medicine

## 2019-04-28 ENCOUNTER — Other Ambulatory Visit: Payer: Self-pay

## 2019-04-28 ENCOUNTER — Encounter: Payer: Self-pay | Admitting: Emergency Medicine

## 2019-04-28 DIAGNOSIS — M25561 Pain in right knee: Secondary | ICD-10-CM

## 2019-04-28 DIAGNOSIS — W010XXA Fall on same level from slipping, tripping and stumbling without subsequent striking against object, initial encounter: Secondary | ICD-10-CM

## 2019-04-28 MED ORDER — TRAMADOL HCL 50 MG PO TABS: 50.0000 mg | ORAL_TABLET | Freq: Three times a day (TID) | ORAL | 0 refills | Status: DC | PRN

## 2019-04-28 NOTE — ED Triage Notes (Signed)
Patient in today c/o right knee pain since she sustained a fall on Sunday (04/24/19). Patient slipped on a wet floor at home landing on her back. Patient states her knee cap looked out of place and she pushed it back in, but has had continued pain.

## 2019-04-28 NOTE — ED Notes (Signed)
Patient was fitted with an 18 in knee immobilizer and crutches.

## 2019-04-28 NOTE — Discharge Instructions (Signed)
Continue taking home Advil as needed.  Take medication as prescribed. Rest.  Ice.  Elevate.  Use knee immobilizer and crutches, gradually apply weight as tolerated.  Drink plenty of fluids.   Follow-up with orthopedic this week as needed for continued pain.  Follow up with your primary care physician this week as needed. Return to Urgent care for new or worsening concerns.

## 2019-04-28 NOTE — ED Provider Notes (Addendum)
[facility] ____________________________________________  Time seen: Approximately 7:14 PM  I have reviewed the triage vital signs and the nursing notes.   HISTORY  Chief Complaint Fall (DOI 04/24/19) and Knee Injury   HPI Alisha Kim is a 48 y.o. female presenting for evaluation of right knee pain.  Patient reports on Sunday she was mopping her floor, but slipped in the water and fell backwards.  States that during this she twisted her knee causing knee pain.  Denies back pain or other injuries.  States she looked down at her knee and the patella was slid to the right.  States that it was very painful but she pushed the patella back in alignment.  States this is happened to her in the past and typically after resting for a few days the pain resolves, however reports the pain has continued.  States pain is predominantly in the middle of the knee, intermittently feels like the knee is going to give out when ambulating.  Has remained ambulatory but painful.  Denies pain radiation, paresthesias or other injuries.  Has been taken Advil without resolution.  Has also applied ice and heat.   Past Medical History:  Diagnosis Date  . Adenomyosis 09/2015  . Allergy   . Anemia    history of  . Anxiety    controlled;   . Asthma   . Depression with anxiety   . Diabetes mellitus without complication ([place])    was told she was pre-diabetic  . Fibroids 08/14/2016   [place] March 2018  . History of Helicobacter pylori infection 08/08/2016   2004  . 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 Hypertension    controlled with medication;   . Pre-diabetes     Patient Active Problem List   Diagnosis Date Noted  . Morbid obesity ([place]) 05/31/2018  . Colon cancer screening   . Allergic rhinitis 03/01/2018  . Cervical stenosis of spinal canal 08/28/2017  . S/P vaginal hysterectomy 09/29/2016  . History of Helicobacter pylori infection 08/08/2016  . Microcytosis 08/01/2016  . Heel spur, left  04/07/2016  . Bilateral foot pain 04/04/2016  . Anxiety 04/04/2016  . Hypertension, goal below 140/90 07/24/2015  . Bilateral hip pain 11/17/2014  . Atopic dermatitis 11/17/2014  . Low serum vitamin D 11/17/2014  . Pre-diabetes 11/17/2014  . Depression with anxiety 11/17/2014  . Mild persistent asthma with allergic rhinitis without complication [id number]    Past Surgical History:  Procedure Laterality Date  . ABDOMINAL HYSTERECTOMY    . COLONOSCOPY WITH PROPOFOL N/A 05/17/2018   Procedure: COLONOSCOPY WITH PROPOFOL;  Surgeon: Lin Landsman, MD;  Location: [facility] ENDOSCOPY;  Service: Gastroenterology;  Laterality: N/A;  . DENTAL SURGERY    . NO PAST SURGERIES    . TONSILLECTOMY    . VAGINAL HYSTERECTOMY Bilateral 09/29/2016   Procedure: HYSTERECTOMY VAGINAL WITH BILATERAL SALPINGECTOMY;  Surgeon: [name], MD;  Location: [facility] ORS;  Service: Gynecology;  Laterality: Bilateral;     No current facility-administered medications for this encounter.   Current Outpatient Medications:  .  albuterol (PROAIR HFA) 108 (90 Base) MCG/ACT inhaler, Inhale 1-2 puffs into the lungs every 4 (four) hours as needed for wheezing or shortness of breath., Disp: 1 Inhaler, Rfl: 1 .  amLODipine (NORVASC) 5 MG tablet, Take 1 tablet (5 mg total) by mouth every evening., Disp: 90 tablet, Rfl: 3 .  DULoxetine (CYMBALTA) 30 MG capsule, Take 2 capsules (60 mg total) by mouth daily. Take 1 tablet daily for  one week then take 2 tablets daily thereafter., Disp: 180 capsule, Rfl: 1 .  fluticasone (FLONASE) 50 MCG/ACT nasal spray, Place 2 sprays into both nostrils daily., Disp: 16 g, Rfl: 6 .  HYDROcodone-acetaminophen (NORCO/VICODIN) 5-325 MG tablet, 1-2 tabs po qd prn, Disp: 6 tablet, Rfl: 0 .  hydrOXYzine (ATARAX/VISTARIL) 25 MG tablet, Take 1 tablet (25 mg total) by mouth every 8 (eight) hours as needed for itching., Disp: 30 tablet, Rfl: 2 .  PAZEO 0.7 % SOLN, Place 2 drops into both eyes as needed., Disp:  , Rfl: 2 .  Cholecalciferol (VITAMIN D3 GUMMIES ADULT PO), Take by mouth., Disp: , Rfl:  .  cyclobenzaprine (FLEXERIL) 10 MG tablet, Take 1 tablet (10 mg total) by mouth at bedtime., Disp: 30 tablet, Rfl: 0 .  fluticasone (FLOVENT HFA) 110 MCG/ACT inhaler, Inhale 2 puffs into the lungs 2 (two) times a day. Rinse mouth out after 2nd puff; use every day, Disp: 1 Inhaler, Rfl: 12 .  traMADol (ULTRAM) 50 MG tablet, Take 1 tablet (50 mg total) by mouth every 8 (eight) hours as needed for moderate pain or severe pain., Disp: 12 tablet, Rfl: 0  Allergies Meloxicam, Zithromax [azithromycin], Pseudoephedrine-guaifenesin, Shellfish allergy, and Shellfish-derived products  Family History  Problem Relation Age of Onset  . Alcohol abuse Mother   . Arthritis Mother   . Asthma Mother   . Depression Mother   . Drug abuse Mother   . Hypertension Mother   . Mental illness Mother   . Cancer Mother        breast and lung  . Diabetes Mother   . Breast cancer Mother 58  . Alcohol abuse Father   . Drug abuse Father   . Cancer Father        lung  . Hypertension Brother   . Hearing loss Maternal Aunt   . Hypertension Maternal Aunt   . Stroke Maternal Aunt   . Alzheimer's disease Maternal Aunt   . Breast cancer Maternal Aunt        mat great aunt  . Cancer Maternal Grandmother        cervical  . Heart disease Maternal Grandmother   . Hypertension Maternal Grandmother     Social History Social History   Tobacco Use  . Smoking status: Never Smoker  . Smokeless tobacco: Never Used  Substance Use Topics  . Alcohol use: Not Currently    Alcohol/week: 0.0 standard drinks  . Drug use: No    Review of Systems Constitutional: No fever Cardiovascular: Denies chest pain. Respiratory: Denies shortness of breath. Musculoskeletal: Positive right knee pain. Skin: Negative for rash.   ____________________________________________   PHYSICAL EXAM:  VITAL SIGNS: ED Triage Vitals  Enc Vitals  Group     BP 04/28/19 1816 118/74     Pulse Rate 04/28/19 1816 84     Resp 04/28/19 1816 16     Temp 04/28/19 1816 97.8 F (36.6 C)     Temp Source 04/28/19 1816 Oral     SpO2 04/28/19 1816 100 %     Weight 04/28/19 1816 235 lb (106.6 kg)     Height 04/28/19 1816 5\' 6"  (1.676 m)     Head Circumference --      Peak Flow --      Pain Score 04/28/19 1815 8     Pain Loc --      Pain Edu? --      Excl. in [place]? --     Constitutional: Alert  and oriented. Well appearing and in no acute distress. ENT      Head: Normocephalic and atraumatic. Cardiovascular:Good peripheral circulation. Respiratory: Normal respiratory effort without tachypnea nor retractions.  Musculoskeletal:  Bilateral pedal pulses equal and easily palpated.  Gait not tested due to pain.   Except: Diffuse medial knee tenderness to palpation with tenderness medial to patella, right knee otherwise nontender, able to fully extend, painful with flexion but able to flex approximately 90 degrees, no point bony tenderness, no pain with anterior posterior drawer test, minimal pain with medial and lateral stress. Neurologic:  Normal speech and language.  Skin:  Skin is warm, dry and intact. No rash noted. Psychiatric: Mood and affect are normal. Speech and behavior are normal. Patient exhibits appropriate insight and judgment   ___________________________________________   LABS (all labs ordered are listed, but only abnormal results are displayed)  Labs Reviewed - No data to display ____________________________________________  RADIOLOGY  Dg Knee Complete 4 Views Right  Result Date: 04/28/2019 CLINICAL DATA:  Right knee pain since a fall on 04/24/2019 EXAM: RIGHT KNEE - COMPLETE 4+ VIEW COMPARISON:  None. FINDINGS: The joint spaces are maintained. No acute fracture or osteochondral lesion. No obvious joint effusion. IMPRESSION: No acute bony findings. Electronically Signed   By: Marijo Sanes M.D.   On: 04/28/2019 18:50    ____________________________________________   PROCEDURES Procedures    INITIAL IMPRESSION / ASSESSMENT AND PLAN / ED COURSE  Pertinent labs & imaging results that were available during my care of the patient were reviewed by me and considered in my medical decision making (see chart for details).  Well-appearing patient.  No acute distress.  Right knee injury as described above, suspect subluxation versus dislocation of patella.  Right knee x-ray as above per radiologist and reviewed, no acute bony findings.  Knee immobilizer applied, crutches given.  Gradual increase weightbearing as tolerated.  Continue over-the-counter Aleve Advil and as needed tramadol as needed for breakthrough pain.  Supportive care.  Follow-up with orthopedic this week as needed for continued pain.Discussed indication, risks and benefits of medications with patient.  [place] controlled substance database reviewed, continued with prescription.   Discussed follow up and return parameters including no resolution or any worsening concerns. Patient verbalized understanding and agreed to plan.   ____________________________________________   FINAL CLINICAL IMPRESSION(S) / ED DIAGNOSES  Final diagnoses:  Acute pain of right knee     ED Discharge Orders         Ordered    traMADol (ULTRAM) 50 MG tablet  Every 8 hours PRN     04/28/19 1900           Note: This dictation was prepared with Dragon dictation along with smaller phrase technology. Any transcriptional errors that result from this process are unintentional.        Marylene Land, NP 04/28/19 1920

## 2019-05-10 ENCOUNTER — Other Ambulatory Visit: Payer: Self-pay | Admitting: Physician Assistant

## 2019-05-10 DIAGNOSIS — S83014A Lateral dislocation of right patella, initial encounter: Secondary | ICD-10-CM

## 2019-05-10 DIAGNOSIS — M25569 Pain in unspecified knee: Secondary | ICD-10-CM

## 2019-05-20 ENCOUNTER — Ambulatory Visit
Admission: RE | Admit: 2019-05-20 | Discharge: 2019-05-20 | Disposition: A | Discharge status: Home / Self Care | Payer: No Typology Code available for payment source | Source: Ambulatory Visit | Attending: Physician Assistant | Admitting: Physician Assistant

## 2019-05-20 ENCOUNTER — Other Ambulatory Visit: Payer: Self-pay

## 2019-05-20 DIAGNOSIS — S83014A Lateral dislocation of right patella, initial encounter: Secondary | ICD-10-CM

## 2019-05-20 DIAGNOSIS — M25569 Pain in unspecified knee: Secondary | ICD-10-CM

## 2019-06-23 ENCOUNTER — Other Ambulatory Visit: Payer: Self-pay | Admitting: Family Medicine

## 2019-06-23 DIAGNOSIS — I10 Essential (primary) hypertension: Secondary | ICD-10-CM

## 2019-06-23 NOTE — Telephone Encounter (Signed)
Requested medication (s) are due for refill today:  yes  Requested medication (s) are on the active medication list: yes  Last refill:  02/15/2019  Future visit scheduled: no  Notes to clinic: no valid encounter within last 6 months   Requested Prescriptions  Pending Prescriptions Disp Refills   amLODipine (NORVASC) 5 MG tablet [Pharmacy Med Name: AMLODIPINE BESYLATE 5 MG TA 5 Tablet] 90 tablet 3    Sig: Take 1 tablet (5 mg total) by mouth every evening.      Cardiovascular:  Calcium Channel Blockers Failed - 06/23/2019  8:01 AM      Failed - Valid encounter within last 6 months    Recent Outpatient Visits           8 months ago [place], NP   8 months ago Chest heaviness   [facility] [place], Satira Anis, MD   11 months ago [place], NP   1 year ago Hypertension, goal below 140/90   [facility] [place], Satira Anis, MD   1 year ago Hypertension, goal below 140/90   [place], Satira Anis, MD              Passed - Last BP in normal range    BP Readings from Last 1 Encounters:  04/28/19 118/74

## 2019-09-02 ENCOUNTER — Ambulatory Visit
Admission: EM | Admit: 2019-09-02 | Discharge: 2019-09-02 | Disposition: A | Discharge status: Home / Self Care | Payer: No Typology Code available for payment source | Attending: Urgent Care | Admitting: Urgent Care

## 2019-09-02 ENCOUNTER — Other Ambulatory Visit: Payer: Self-pay

## 2019-09-02 DIAGNOSIS — S61212A Laceration without foreign body of right middle finger without damage to nail, initial encounter: Secondary | ICD-10-CM

## 2019-09-02 DIAGNOSIS — W274XXA Contact with kitchen utensil, initial encounter: Secondary | ICD-10-CM

## 2019-09-02 MED ORDER — LIDOCAINE HCL (PF) 1 % IJ SOLN: 5.0000 mL | Freq: Once | INTRAMUSCULAR | Status: DC

## 2019-09-02 MED ORDER — BACITRACIN ZINC 500 UNIT/GM EX OINT: TOPICAL_OINTMENT | Freq: Once | CUTANEOUS | Status: DC

## 2019-09-02 NOTE — ED Triage Notes (Signed)
Pt presents with c/o laceration to right middle finger that happened this morning. Pt cut it on a blender blade. Pt thinks her last Tetanus may have been about 5 years ago.

## 2019-09-02 NOTE — Discharge Instructions (Addendum)
It was very nice seeing you today in clinic. Thank you for entrusting me with your care.   Keep wound clean and dry. Monitor for signs and symptoms of infection, which would include increased redness, swelling, streaking, drainage, pain, and the development of a fever. Apply antibiotic ointment daily. Use splint to help protect stitches for at least the next 48 hours.   Return here in 10 days to have your FIVE (5) stitches removed. If your symptoms/condition worsens, please seek follow up care either here or in the ER. Please remember, our [facility] providers are "right here with you" when you need [place].   Again, it was my pleasure to take care of you today. Thank you for choosing our clinic. I hope that you start to feel better quickly.   Honor Loh, MSN, APRN, FNP-C, CEN Advanced Practice Provider [facility]

## 2019-09-04 NOTE — ED Provider Notes (Signed)
[place], [place]   Name: Alisha Kim DOB: 1970/12/22 MRN: [id number] CSN: [id number] PCP: Delsa Grana, PA-C  Arrival date and time:  09/02/19 1435  Chief Complaint:  Finger Laceration  NOTE: Prior to seeing the patient today, I have reviewed the triage nursing documentation and vital signs. Clinical staff has updated patient's PMH/PSHx, current medication list, and drug allergies/intolerances to ensure comprehensive history available to assist in medical decision making.   History:   HPI: Alisha Kim is a 49 y.o. female who presents today with complaints of pain in her RIGHT middle finger following traumatic injury that occurred this morning. Patient reports that she made a smoothie and washed her new [employer] blender afterwards. During the course of putting the blender back together, patient inadvertently lacerated her finger on the blade. Patient presents with jagged laceration to the palmar aspect of third digit on her RIGHT hand. Patient reports that injury occurred at around 0800 this morning, however she had a scheduled appointment for some car maintenance, which she completed prior to coming in. Wound was cleansed and dressed immediately after it occurred. She has FROM and sensation in the digit. Bleeding is controlled upon arrival. Despite her symptoms, patient has not taken any over the counter interventions to help improve/relieve her reported symptoms at home.  Tetanus vaccination status reviewed with patient. Based on her reports, it is determined that she is up to date on her tetanus prophylaxis; last was about 5 years ago.   Past Medical History:  Diagnosis Date  . Adenomyosis 09/2015  . Allergy   . Anemia    history of  . Anxiety    controlled;   . Asthma   . Depression with anxiety   . Diabetes mellitus without complication ([place])    was told she was pre-diabetic  . Fibroids 08/14/2016   [place] March 2018  . History of Helicobacter pylori infection 08/08/2016   2004  .  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 Hypertension    controlled with medication;   . Pre-diabetes     Past Surgical History:  Procedure Laterality Date  . ABDOMINAL HYSTERECTOMY    . COLONOSCOPY WITH PROPOFOL N/A 05/17/2018   Procedure: COLONOSCOPY WITH PROPOFOL;  Surgeon: Lin Landsman, MD;  Location: [facility] ENDOSCOPY;  Service: Gastroenterology;  Laterality: N/A;  . DENTAL SURGERY    . NO PAST SURGERIES    . TONSILLECTOMY    . VAGINAL HYSTERECTOMY Bilateral 09/29/2016   Procedure: HYSTERECTOMY VAGINAL WITH BILATERAL SALPINGECTOMY;  Surgeon: [name], MD;  Location: [facility] ORS;  Service: Gynecology;  Laterality: Bilateral;    Family History  Problem Relation Age of Onset  . Alcohol abuse Mother   . Arthritis Mother   . Asthma Mother   . Depression Mother   . Drug abuse Mother   . Hypertension Mother   . Mental illness Mother   . Cancer Mother        breast and lung  . Diabetes Mother   . Breast cancer Mother 72  . Alcohol abuse Father   . Drug abuse Father   . Cancer Father        lung  . Hypertension Brother   . Hearing loss Maternal Aunt   . Hypertension Maternal Aunt   . Stroke Maternal Aunt   . Alzheimer's disease Maternal Aunt   . Breast cancer Maternal Aunt        mat great aunt  . Cancer Maternal Grandmother  cervical  . Heart disease Maternal Grandmother   . Hypertension Maternal Grandmother     Social History   Tobacco Use  . Smoking status: Never Smoker  . Smokeless tobacco: Never Used  Substance Use Topics  . Alcohol use: Not Currently    Alcohol/week: 0.0 standard drinks  . Drug use: No    Patient Active Problem List   Diagnosis Date Noted  . Morbid obesity ([place]) 05/31/2018  . Colon cancer screening   . Allergic rhinitis 03/01/2018  . Cervical stenosis of spinal canal 08/28/2017  . S/P vaginal hysterectomy 09/29/2016  . History of Helicobacter pylori infection 08/08/2016  . Microcytosis 08/01/2016  . Heel spur, left 04/07/2016    . Bilateral foot pain 04/04/2016  . Anxiety 04/04/2016  . Hypertension, goal below 140/90 07/24/2015  . Bilateral hip pain 11/17/2014  . Atopic dermatitis 11/17/2014  . Low serum vitamin D 11/17/2014  . Pre-diabetes 11/17/2014  . Depression with anxiety 11/17/2014  . Mild persistent asthma with allergic rhinitis without complication [id number]    Home Medications:    Current Meds  Medication Sig  . albuterol (PROAIR HFA) 108 (90 Base) MCG/ACT inhaler Inhale 1-2 puffs into the lungs every 4 (four) hours as needed for wheezing or shortness of breath.  Marland Kitchen amLODipine (NORVASC) 5 MG tablet TAKE 1 TABLET (5 MG TOTAL) BY MOUTH EVERY EVENING.  Marland Kitchen Cholecalciferol (VITAMIN D3 GUMMIES ADULT PO) Take by mouth.  . DULoxetine (CYMBALTA) 30 MG capsule Take 2 capsules (60 mg total) by mouth daily. Take 1 tablet daily for one week then take 2 tablets daily thereafter.  . fluticasone (FLONASE) 50 MCG/ACT nasal spray Place 2 sprays into both nostrils daily.  . fluticasone (FLOVENT HFA) 110 MCG/ACT inhaler Inhale 2 puffs into the lungs 2 (two) times a day. Rinse mouth out after 2nd puff; use every day  . hydrOXYzine (ATARAX/VISTARIL) 25 MG tablet Take 1 tablet (25 mg total) by mouth every 8 (eight) hours as needed for itching.  Marland Kitchen PAZEO 0.7 % SOLN Place 2 drops into both eyes as needed.    Allergies:   Meloxicam, Zithromax [azithromycin], Pseudoephedrine-guaifenesin, Shellfish allergy, and Shellfish-derived products  Review of Systems (ROS):  Review of systems NEGATIVE unless otherwise noted in narrative H&P section.   Vital Signs: Today's Vitals   09/02/19 1531 09/02/19 1535 09/02/19 1626  BP:  132/90   Pulse:  73   Resp:  18   Temp:  98.3 F (36.8 C)   TempSrc:  Oral   SpO2:  100%   Weight: 255 lb (115.7 kg)    Height: 5\' 6"  (1.676 m)    PainSc: 3   2     Physical Exam: Physical Exam  Constitutional: She is oriented to person, place, and time and well-developed, well-nourished, and in  no distress.  HENT:  Head: Normocephalic and atraumatic.  Eyes: Pupils are equal, round, and reactive to light.  Cardiovascular: Normal rate and intact distal pulses.  Pulmonary/Chest: Effort normal. No respiratory distress.  Musculoskeletal:     Right hand: Laceration and tenderness present. No swelling or deformity. Normal range of motion. Normal strength. Normal sensation. There is no disruption of two-point discrimination.       Hands:     Comments: 2 cm jagged laceration to marked location. Bleeding controlled. Edges approximate well, with the exception of the distal most aspect of the wound that has a flap of mangled tissue. (+) FROM; able to make fist; strength normal against opposition. (+) PMS with color,  temperature, and capillary refill WNL.  Neurological: She is alert and oriented to person, place, and time. She has normal sensation. Gait normal.  Skin: Skin is warm and dry. No rash noted. She is not diaphoretic.  Psychiatric: Memory, affect and judgment normal. Her mood appears anxious.  Nursing note and vitals reviewed.   Urgent Care Treatments / Results:   Orders Placed This Encounter  Procedures  . ED LACERATION REPAIR  . Apply dressing  . Apply finger splint static    LABS: PLEASE NOTE: all labs that were ordered this encounter are listed, however only abnormal results are displayed. Labs Reviewed - No data to display  EKG: -None  RADIOLOGY: No results found.  PROCEDURES: Laceration Repair Performed by: Karen Kitchens, NP Authorized by: Karen Kitchens, NP   Consent:    Consent obtained:  Verbal   Consent given by:  Patient   Risks discussed:  Infection, need for additional repair, pain, poor cosmetic result and poor wound healing   Alternatives discussed:  Delayed treatment and referral Universal protocol:    Procedure explained and questions answered to patient or proxy's satisfaction: yes     Patient identity confirmed:  Verbally with  patient Anesthesia (see MAR for exact dosages):    Anesthesia method:  Local infiltration   Local anesthetic:  Lidocaine 1% w/o epi Laceration details:    Location:  Finger   Finger location:  R long finger   Length (cm):  2 Repair type:    Repair type:  Simple Pre-procedure details:    Preparation:  Patient was prepped and draped in usual sterile fashion Exploration:    Hemostasis achieved with:  Direct pressure   Wound exploration: wound explored through full range of motion and entire depth of wound probed and visualized     Contaminated: no   Treatment:    Area cleansed with:  Betadine, Hibiclens and saline   Amount of cleaning:  Standard   Irrigation solution:  Sterile saline   Irrigation volume:  50   Irrigation method:  Pressure wash and syringe   Visualized foreign bodies/material removed: no   Skin repair:    Repair method:  Sutures   Suture size:  5-0   Suture material:  Prolene   Suture technique:  Simple interrupted   Number of sutures:  5 Approximation:    Approximation:  Close Post-procedure details:    Dressing:  Antibiotic ointment, non-adherent dressing, sterile dressing and splint for protection   Patient tolerance of procedure:  Tolerated well, no immediate complications    MEDICATIONS RECEIVED THIS VISIT:  Meds this encounter  Medications  . lidocaine (PF) (XYLOCAINE) 1 % injection 5 mL - GIVEN  . bacitracin ointment 1 application - GIVEN    PERTINENT CLINICAL COURSE NOTES/UPDATES:   Initial Impression / Assessment and Plan / Urgent Care Course:  Pertinent labs & imaging results that were available during my care of the patient were personally reviewed by me and considered in my medical decision making (see lab/imaging section of note for values and interpretations).  Alisha Kim is a 48 y.o. female who presents to [facility] today with complaints of Finger Laceration  Patient is well appearing overall in clinic today. She does not  appear to be in any acute distress. Presenting symptoms (see HPI) and exam as documented above. Laceration to RIGHT third digit sustained this morning when cleaning blender after use. Laceration repaired as per above procedure note. Wound cleansed and dressed in clinic;  bacitracin and protective splint applied. Patient educated on need for daily wound care. She was encouraged to keep wound clean and dry. She was instructed to apply antibiotic ointment 1-2 times daily. Wound may be left open to air while at home, however she was encouraged to cover area while in public to prevent infection. Patient to monitor for signs and symptoms of infection, which would include increased redness, swelling, streaking, drainage, pain, and the development of a fever. She will RTC in 10 days to have FIVE (5) sutures removed. In the interim, if there are any questions or concerns related to the would, patient was encouraged to call the clinic to discuss.  I have reviewed the follow up and strict return precautions for any new or worsening symptoms. Patient is aware of symptoms that would be deemed urgent/emergent, and would thus require further evaluation either here or in the emergency department. At the time of discharge, she verbalized understanding and consent with the discharge plan as it was reviewed with her. All questions were fielded by provider and/or clinic staff prior to patient discharge.    Final Clinical Impressions / Urgent Care Diagnoses:   Final diagnoses:  Laceration of right middle finger without foreign body without damage to nail, initial encounter  Contact with kitchen utensil, initial encounter    New Prescriptions:  [place] Controlled Substance Registry consulted? Not Applicable  Meds ordered this encounter  Medications  . lidocaine (PF) (XYLOCAINE) 1 % injection 5 mL  . bacitracin ointment    Recommended Follow up Care:  Patient encouraged to follow up with the following provider within the  specified time frame, or sooner as dictated by the severity of her symptoms. As always, she was instructed that for any urgent/emergent care needs, she should seek care either here or in the emergency department for more immediate evaluation.  Follow-up Information    [place] In 10 days.   Specialty: Urgent Care Why: For suture removal Contact information: [place] [place] [phone]        NOTE: This note was prepared using Dragon dictation software along with smaller phrase technology. Despite my best ability to proofread, there is the potential that transcriptional errors may still occur from this process, and are completely unintentional.    Karen Kitchens, NP 09/04/19 2105

## 2019-09-16 ENCOUNTER — Ambulatory Visit
Admission: EM | Admit: 2019-09-16 | Discharge: 2019-09-16 | Disposition: A | Discharge status: Home / Self Care | Payer: No Typology Code available for payment source

## 2019-09-16 ENCOUNTER — Other Ambulatory Visit: Payer: Self-pay

## 2019-09-16 ENCOUNTER — Encounter: Payer: Self-pay | Admitting: Emergency Medicine

## 2019-09-16 DIAGNOSIS — Z4802 Encounter for removal of sutures: Secondary | ICD-10-CM

## 2019-09-16 NOTE — ED Triage Notes (Signed)
Patient here for suture removal to her right middle finger.  Patient denies any problems.

## 2019-11-02 ENCOUNTER — Ambulatory Visit
Admission: EM | Admit: 2019-11-02 | Discharge: 2019-11-02 | Disposition: A | Discharge status: Home / Self Care | Payer: No Typology Code available for payment source | Attending: Family Medicine | Admitting: Family Medicine

## 2019-11-02 ENCOUNTER — Other Ambulatory Visit: Payer: Self-pay

## 2019-11-02 DIAGNOSIS — H6011 Cellulitis of right external ear: Secondary | ICD-10-CM | POA: Diagnosis not present

## 2019-11-02 DIAGNOSIS — H6091 Unspecified otitis externa, right ear: Secondary | ICD-10-CM

## 2019-11-02 MED ORDER — AMOXICILLIN-POT CLAVULANATE 875-125 MG PO TABS
1.0000 | ORAL_TABLET | Freq: Two times a day (BID) | ORAL | 0 refills | Status: DC
Start: 2019-11-02 — End: 2020-02-17

## 2019-11-02 MED ORDER — NEOMYCIN-POLYMYXIN-HC 3.5-10000-1 OT SUSP
4.0000 [drp] | Freq: Three times a day (TID) | OTIC | 0 refills | Status: AC
Start: 2019-11-02 — End: 2019-11-09

## 2019-11-02 NOTE — Discharge Instructions (Addendum)
Take medication as prescribed. Rest. Drink plenty of fluids. Avoid scratching.   Follow up with your primary care physician this week as needed. Return to Urgent care for new or worsening concerns.   

## 2019-11-02 NOTE — ED Provider Notes (Signed)
[facility] ____________________________________________  Time seen: Approximately 12:55 PM  I have reviewed the triage vital signs and the nursing notes.   HISTORY  Chief Complaint Otalgia (right)   HPI Alisha Kim is a 49 y.o. female presenting for evaluation of right outer ear redness and rash.  Reports 1 week ago she first had several red bumps that were very itchy to the outside of her right ear.  States she did notice this after working outside in her flower beds.  States she often has sensitive skin and figured she was having an allergy.  Did take Benadryl without resolution as well as apply topical hydrocortisone cream.  Reports she has had continued redness to the ear on the outside that got more red and then has slightly improved and the redness, still itching.  Denies pain to the inner ear but reports the inner ear feels itchy and has a pressure.  Denies hearing changes.  Denies further surrounding rash or skin changes.  Denies any initial recurrent vesicular appearance.  Denies fevers, ear drainage, trauma, cough, sore throat, chest pain, shortness of breath, facial asymmetry, paresthesias, unilateral weakness or vision changes.  Ports otherwise feels well.  Delsa Grana, PA-C : PCP   Past Medical History:  Diagnosis Date  . Adenomyosis 09/2015  . Allergy   . Anemia    history of  . Anxiety    controlled;   . Asthma   . Depression with anxiety   . Diabetes mellitus without complication ([place])    was told she was pre-diabetic  . Fibroids 08/14/2016   [place] March 2018  . History of Helicobacter pylori infection 08/08/2016   2004  . 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 Hypertension    controlled with medication;   . Pre-diabetes     Patient Active Problem List   Diagnosis Date Noted  . Morbid obesity ([place]) 05/31/2018  . Colon cancer screening   . Allergic rhinitis 03/01/2018  . Cervical stenosis of spinal canal 08/28/2017  . S/P vaginal hysterectomy  09/29/2016  . History of Helicobacter pylori infection 08/08/2016  . Microcytosis 08/01/2016  . Heel spur, left 04/07/2016  . Bilateral foot pain 04/04/2016  . Anxiety 04/04/2016  . Hypertension, goal below 140/90 07/24/2015  . Bilateral hip pain 11/17/2014  . Atopic dermatitis 11/17/2014  . Low serum vitamin D 11/17/2014  . Pre-diabetes 11/17/2014  . Depression with anxiety 11/17/2014  . Mild persistent asthma with allergic rhinitis without complication [id number]    Past Surgical History:  Procedure Laterality Date  . ABDOMINAL HYSTERECTOMY    . COLONOSCOPY WITH PROPOFOL N/A 05/17/2018   Procedure: COLONOSCOPY WITH PROPOFOL;  Surgeon: Lin Landsman, MD;  Location: [facility] ENDOSCOPY;  Service: Gastroenterology;  Laterality: N/A;  . DENTAL SURGERY    . NO PAST SURGERIES    . TONSILLECTOMY    . VAGINAL HYSTERECTOMY Bilateral 09/29/2016   Procedure: HYSTERECTOMY VAGINAL WITH BILATERAL SALPINGECTOMY;  Surgeon: [name], MD;  Location: [facility] ORS;  Service: Gynecology;  Laterality: Bilateral;     No current facility-administered medications for this encounter.  Current Outpatient Medications:  .  albuterol (PROAIR HFA) 108 (90 Base) MCG/ACT inhaler, Inhale 1-2 puffs into the lungs every 4 (four) hours as needed for wheezing or shortness of breath., Disp: 1 Inhaler, Rfl: 1 .  amLODipine (NORVASC) 5 MG tablet, TAKE 1 TABLET (5 MG TOTAL) BY MOUTH EVERY EVENING., Disp: 90 tablet, Rfl: 3 .  DULoxetine (CYMBALTA) 30 MG capsule, Take 2 capsules (60  mg total) by mouth daily. Take 1 tablet daily for one week then take 2 tablets daily thereafter., Disp: 180 capsule, Rfl: 1 .  fluticasone (FLONASE) 50 MCG/ACT nasal spray, Place 2 sprays into both nostrils daily., Disp: 16 g, Rfl: 6 .  fluticasone (FLOVENT HFA) 110 MCG/ACT inhaler, Inhale 2 puffs into the lungs 2 (two) times a day. Rinse mouth out after 2nd puff; use every day, Disp: 1 Inhaler, Rfl: 12 .  hydrOXYzine (ATARAX/VISTARIL) 25 MG  tablet, Take 1 tablet (25 mg total) by mouth every 8 (eight) hours as needed for itching., Disp: 30 tablet, Rfl: 2 .  PAZEO 0.7 % SOLN, Place 2 drops into both eyes as needed., Disp: , Rfl: 2 .  amoxicillin-clavulanate (AUGMENTIN) 875-125 MG tablet, Take 1 tablet by mouth every 12 (twelve) hours., Disp: 20 tablet, Rfl: 0 .  Cholecalciferol (VITAMIN D3 GUMMIES ADULT PO), Take by mouth., Disp: , Rfl:  .  neomycin-polymyxin-hydrocortisone (CORTISPORIN) 3.5-10000-1 OTIC suspension, Place 4 drops into the right ear 3 (three) times daily for 7 days., Disp: 5 mL, Rfl: 0  Allergies Meloxicam, Zithromax [azithromycin], Pseudoephedrine-guaifenesin, Shellfish allergy, and Shellfish-derived products  Family History  Problem Relation Age of Onset  . Alcohol abuse Mother   . Arthritis Mother   . Asthma Mother   . Depression Mother   . Drug abuse Mother   . Hypertension Mother   . Mental illness Mother   . Cancer Mother        breast and lung  . Diabetes Mother   . Breast cancer Mother 66  . Alcohol abuse Father   . Drug abuse Father   . Cancer Father        lung  . Hypertension Brother   . Hearing loss Maternal Aunt   . Hypertension Maternal Aunt   . Stroke Maternal Aunt   . Alzheimer's disease Maternal Aunt   . Breast cancer Maternal Aunt        mat great aunt  . Cancer Maternal Grandmother        cervical  . Heart disease Maternal Grandmother   . Hypertension Maternal Grandmother     Social History Social History   Tobacco Use  . Smoking status: Never Smoker  . Smokeless tobacco: Never Used  Substance Use Topics  . Alcohol use: Not Currently    Alcohol/week: 0.0 standard drinks  . Drug use: No    Review of Systems Constitutional: No fever/chills Eyes: No visual changes. ENT: No sore throat. Cardiovascular: Denies chest pain. Respiratory: Denies shortness of breath. Gastrointestinal: No abdominal pain.  Skin: Positive skin changes. Neurological: Negative for headaches,  focal weakness or numbness.   ____________________________________________   PHYSICAL EXAM:  VITAL SIGNS: ED Triage Vitals  Enc Vitals Group     BP 11/02/19 1224 (!) 129/92     Pulse Rate 11/02/19 1224 70     Resp 11/02/19 1224 18     Temp 11/02/19 1224 98 F (36.7 C)     Temp Source 11/02/19 1224 Oral     SpO2 11/02/19 1224 100 %     Weight 11/02/19 1222 250 lb (113.4 kg)     Height 11/02/19 1222 5\' 6"  (1.676 m)     Head Circumference --      Peak Flow --      Pain Score 11/02/19 1221 6     Pain Loc --      Pain Edu? --      Excl. in [place]? --  Constitutional: Alert and oriented. Well appearing and in no acute distress. Eyes: Conjunctivae are normal. PERRL. EOMI. ENT      Head: Normocephalic.  No TMJ tenderness.      Ears: Left: nontender, normal canal, no erythema, normal TM. Right: Nontender, external auricle mild to moderate erythema with dryness, mild diffuse auricle edema, no drainage, no fluctuance, nonvesicular, no further surrounding erythema.  Canal with mild erythema and edema, no exudate, no drainage, TM nonerythematous and normal-appearing.      Nose: No congestion/rhinnorhea. Hematological/Lymphatic/Immunilogical: No cervical lymphadenopathy. Cardiovascular: Normal rate, regular rhythm. Grossly normal heart sounds.  Good peripheral circulation. Respiratory: Normal respiratory effort without tachypnea nor retractions. Breath sounds are clear and equal bilaterally. No wheezes, rales, rhonchi. Musculoskeletal: Steady gait Neurologic:  Normal speech and language. No gross focal neurologic deficits are appreciated. Speech is normal. No gait instability.  No paresthesias noted to face.  No facial asymmetry noted. Skin:  Skin is warm, dry and intact. No rash noted. Psychiatric: Mood and affect are normal. Speech and behavior are normal. Patient exhibits appropriate insight and judgment   ___________________________________________   LABS (all labs ordered are  listed, but only abnormal results are displayed)  Labs Reviewed - No data to display ____________________________________________  PROCEDURES Procedures    INITIAL IMPRESSION / ASSESSMENT AND PLAN / ED COURSE  Pertinent labs & imaging results that were available during my care of the patient were reviewed by me and considered in my medical decision making (see chart for details).  Well-appearing patient.  No acute distress.  Right external ear cellulitis, suspect secondary to insect bite or contact dermatitis, mild otitis externa.  Will treat with oral Augmentin and Cortisporin.  Topical over-the-counter antibiotic ointment.  Monitor.  Supportive care.Discussed indication, risks and benefits of medications with patient.   Discussed follow up with Primary care physician this week. Discussed follow up and return parameters including no resolution or any worsening concerns. Patient verbalized understanding and agreed to plan.   ____________________________________________   FINAL CLINICAL IMPRESSION(S) / ED DIAGNOSES  Final diagnoses:  Cellulitis of right ear  Otitis externa of right ear, unspecified chronicity, unspecified type     ED Discharge Orders         Ordered    neomycin-polymyxin-hydrocortisone (CORTISPORIN) 3.5-10000-1 OTIC suspension  3 times daily     11/02/19 1248    amoxicillin-clavulanate (AUGMENTIN) 875-125 MG tablet  Every 12 hours     11/02/19 1248           Note: This dictation was prepared with Dragon dictation along with smaller phrase technology. Any transcriptional errors that result from this process are unintentional.         Marylene Land, NP 11/02/19 1308

## 2019-11-02 NOTE — ED Triage Notes (Signed)
Patient complains of right ear pain that started 1 week ago. Patient states that originally is started as a rash and she had been using creams. States that now pain has settled down in to her ear.

## 2019-12-10 ENCOUNTER — Ambulatory Visit
Admission: EM | Admit: 2019-12-10 | Discharge: 2019-12-10 | Disposition: A | Discharge status: Home / Self Care | Payer: No Typology Code available for payment source | Attending: Emergency Medicine | Admitting: Emergency Medicine

## 2019-12-10 ENCOUNTER — Encounter: Payer: Self-pay | Admitting: Emergency Medicine

## 2019-12-10 ENCOUNTER — Other Ambulatory Visit: Payer: Self-pay

## 2019-12-10 DIAGNOSIS — Z8249 Family history of ischemic heart disease and other diseases of the circulatory system: Secondary | ICD-10-CM | POA: Insufficient documentation

## 2019-12-10 DIAGNOSIS — Z833 Family history of diabetes mellitus: Secondary | ICD-10-CM | POA: Diagnosis not present

## 2019-12-10 DIAGNOSIS — J4530 Mild persistent asthma, uncomplicated: Secondary | ICD-10-CM | POA: Diagnosis not present

## 2019-12-10 DIAGNOSIS — Z881 Allergy status to other antibiotic agents status: Secondary | ICD-10-CM | POA: Diagnosis not present

## 2019-12-10 DIAGNOSIS — F419 Anxiety disorder, unspecified: Secondary | ICD-10-CM | POA: Diagnosis not present

## 2019-12-10 DIAGNOSIS — Z20822 Contact with and (suspected) exposure to covid-19: Secondary | ICD-10-CM | POA: Diagnosis not present

## 2019-12-10 DIAGNOSIS — Z90722 Acquired absence of ovaries, bilateral: Secondary | ICD-10-CM | POA: Insufficient documentation

## 2019-12-10 DIAGNOSIS — Z7951 Long term (current) use of inhaled steroids: Secondary | ICD-10-CM | POA: Diagnosis not present

## 2019-12-10 DIAGNOSIS — Z888 Allergy status to other drugs, medicaments and biological substances status: Secondary | ICD-10-CM | POA: Insufficient documentation

## 2019-12-10 DIAGNOSIS — J019 Acute sinusitis, unspecified: Secondary | ICD-10-CM | POA: Insufficient documentation

## 2019-12-10 DIAGNOSIS — I10 Essential (primary) hypertension: Secondary | ICD-10-CM | POA: Diagnosis not present

## 2019-12-10 DIAGNOSIS — Z79899 Other long term (current) drug therapy: Secondary | ICD-10-CM | POA: Diagnosis not present

## 2019-12-10 DIAGNOSIS — E119 Type 2 diabetes mellitus without complications: Secondary | ICD-10-CM | POA: Insufficient documentation

## 2019-12-10 DIAGNOSIS — Z90710 Acquired absence of both cervix and uterus: Secondary | ICD-10-CM | POA: Diagnosis not present

## 2019-12-10 LAB — SARS CORONAVIRUS 2 (TAT 6-24 HRS): SARS Coronavirus 2: NEGATIVE

## 2019-12-10 MED ORDER — PREDNISONE 50 MG PO TABS: 50.0000 mg | ORAL_TABLET | Freq: Every day | ORAL | 0 refills | Status: DC

## 2019-12-10 MED ORDER — CEFDINIR 300 MG PO CAPS
300.0000 mg | ORAL_CAPSULE | Freq: Two times a day (BID) | ORAL | 0 refills | Status: DC
Start: 2019-12-10 — End: 2020-02-17

## 2019-12-10 NOTE — ED Provider Notes (Signed)
[facility] - [place], [place]   Name: Alisha Kim DOB: [birth year] MRN: [id number] CSN: [id number] PCP: Delsa Grana, PA-C  Arrival date and time:  12/10/19 0926  Chief Complaint:  Sinus Problem and Otalgia   NOTE: Prior to seeing the patient today, I have reviewed the triage nursing documentation and vital signs. Clinical staff has updated patient's PMH/PSHx, current medication list, and drug allergies/intolerances to ensure comprehensive history available to assist in medical decision making.   History:   HPI: Alisha Kim is a 49 y.o. female who presents today with complaints of sinus pressure and ear pain.  Patient states her sinus pressure started approximately 1 week ago.  She started to treat the pain and discomfort with over-the-counter allergy medication and the pain subsided about the week.  However 2 days ago, the pain in her sinuses started to rapidly increase.  She also noted increased amount of postnasal drip as well and bilateral ear pain.  She denies any recordable fevers, body aches, or chills.  She denies any known exposure to COVID-19.  She is fully vaccinated against COVID-19.   Past Medical History:  Diagnosis Date  . Adenomyosis 09/2015  . Allergy   . Anemia    history of  . Anxiety    controlled;   . Asthma   . Depression with anxiety   . Diabetes mellitus without complication ([place])    was told she was pre-diabetic  . Fibroids 08/14/2016   [place] March 2018  . History of Helicobacter pylori infection 08/08/2016   2004  . 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 Hypertension    controlled with medication;   . Pre-diabetes     Past Surgical History:  Procedure Laterality Date  . ABDOMINAL HYSTERECTOMY    . COLONOSCOPY WITH PROPOFOL N/A 05/17/2018   Procedure: COLONOSCOPY WITH PROPOFOL;  Surgeon: Lin Landsman, MD;  Location: [facility] ENDOSCOPY;  Service: Gastroenterology;  Laterality: N/A;  . DENTAL SURGERY    . NO PAST SURGERIES    .  TONSILLECTOMY    . VAGINAL HYSTERECTOMY Bilateral 09/29/2016   Procedure: HYSTERECTOMY VAGINAL WITH BILATERAL SALPINGECTOMY;  Surgeon: [name], MD;  Location: [facility] ORS;  Service: Gynecology;  Laterality: Bilateral;    Family History  Problem Relation Age of Onset  . Alcohol abuse Mother   . Arthritis Mother   . Asthma Mother   . Depression Mother   . Drug abuse Mother   . Hypertension Mother   . Mental illness Mother   . Cancer Mother        breast and lung  . Diabetes Mother   . Breast cancer Mother 7  . Alcohol abuse Father   . Drug abuse Father   . Cancer Father        lung  . Hypertension Brother   . Hearing loss Maternal Aunt   . Hypertension Maternal Aunt   . Stroke Maternal Aunt   . Alzheimer's disease Maternal Aunt   . Breast cancer Maternal Aunt        mat great aunt  . Cancer Maternal Grandmother        cervical  . Heart disease Maternal Grandmother   . Hypertension Maternal Grandmother     Social History   Tobacco Use  . Smoking status: Never Smoker  . Smokeless tobacco: Never Used  Vaping Use  . Vaping Use: Never used  Substance Use Topics  . Alcohol use: Not Currently    Alcohol/week: 0.0 standard  drinks  . Drug use: No    Patient Active Problem List   Diagnosis Date Noted  . Morbid obesity ([place]) 05/31/2018  . Colon cancer screening   . Allergic rhinitis 03/01/2018  . Cervical stenosis of spinal canal 08/28/2017  . S/P vaginal hysterectomy 09/29/2016  . History of Helicobacter pylori infection 08/08/2016  . Microcytosis 08/01/2016  . Heel spur, left 04/07/2016  . Bilateral foot pain 04/04/2016  . Anxiety 04/04/2016  . Hypertension, goal below 140/90 07/24/2015  . Bilateral hip pain 11/17/2014  . Atopic dermatitis 11/17/2014  . Low serum vitamin D 11/17/2014  . Pre-diabetes 11/17/2014  . Depression with anxiety 11/17/2014  . Mild persistent asthma with allergic rhinitis without complication 69/[id number]/9528    Home Medications:     Current Meds  Medication Sig  . albuterol (PROAIR HFA) 108 (90 Base) MCG/ACT inhaler Inhale 1-2 puffs into the lungs every 4 (four) hours as needed for wheezing or shortness of breath.  Marland Kitchen amLODipine (NORVASC) 5 MG tablet TAKE 1 TABLET (5 MG TOTAL) BY MOUTH EVERY EVENING.  Marland Kitchen Cholecalciferol (VITAMIN D3 GUMMIES ADULT PO) Take by mouth.  . DULoxetine (CYMBALTA) 30 MG capsule Take 2 capsules (60 mg total) by mouth daily. Take 1 tablet daily for one week then take 2 tablets daily thereafter.  . fluticasone (FLONASE) 50 MCG/ACT nasal spray Place 2 sprays into both nostrils daily.  . fluticasone (FLOVENT HFA) 110 MCG/ACT inhaler Inhale 2 puffs into the lungs 2 (two) times a day. Rinse mouth out after 2nd puff; use every day  . hydrOXYzine (ATARAX/VISTARIL) 25 MG tablet Take 1 tablet (25 mg total) by mouth every 8 (eight) hours as needed for itching.    Allergies:   Meloxicam, Zithromax [azithromycin], Pseudoephedrine-guaifenesin, Shellfish allergy, and Shellfish-derived products  Review of Systems (ROS): Review of Systems  Constitutional: Negative for activity change, appetite change, diaphoresis, fatigue and fever.  HENT: Positive for congestion, ear pain, postnasal drip, sinus pressure and sinus pain. Negative for ear discharge, facial swelling, hearing loss, sneezing and sore throat.   Eyes: Negative for pain.  Respiratory: Positive for cough. Negative for shortness of breath and wheezing.   Gastrointestinal: Positive for nausea. Negative for constipation and vomiting.  Musculoskeletal: Negative for myalgias.  All other systems reviewed and are negative.    Vital Signs: Today's Vitals   12/10/19 0958 12/10/19 1002 12/10/19 1043  BP:  (!) 135/98   Pulse:  81   Resp:  16   Temp:  98.2 F (36.8 C)   TempSrc:  Oral   SpO2:  100%   Weight: 250 lb (113.4 kg)    Height: 5\' 5"  (1.651 m)    PainSc: 2   2     Physical Exam: Physical Exam Vitals and nursing note reviewed.  HENT:      Head: Normocephalic.     Right Ear: No middle ear effusion.     Left Ear: Tympanic membrane normal.  No middle ear effusion.     Nose: Congestion present.     Right Sinus: Maxillary sinus tenderness and frontal sinus tenderness present.     Left Sinus: Maxillary sinus tenderness and frontal sinus tenderness present.     Mouth/Throat:     Mouth: Mucous membranes are moist.     Pharynx: Posterior oropharyngeal erythema present.     Tonsils: No tonsillar exudate or tonsillar abscesses.  Eyes:     Extraocular Movements: Extraocular movements intact.  Cardiovascular:     Rate and Rhythm: Normal rate and  regular rhythm.     Heart sounds: Normal heart sounds.  Pulmonary:     Effort: Pulmonary effort is normal.     Breath sounds: Normal breath sounds.  Musculoskeletal:     Cervical back: Normal range of motion.  Lymphadenopathy:     Cervical: No cervical adenopathy.  Skin:    General: Skin is warm and dry.  Neurological:     Mental Status: She is alert.      Urgent Care Treatments / Results:   LABS: PLEASE NOTE: all labs that were ordered this encounter are listed, however only abnormal results are displayed. Labs Reviewed  SARS CORONAVIRUS 2 (TAT 6-24 HRS)    EKG: -None  RADIOLOGY: No results found.  PROCEDURES: Procedures  MEDICATIONS RECEIVED THIS VISIT: Medications - No data to display  PERTINENT CLINICAL COURSE NOTES/UPDATES:   Initial Impression / Assessment and Plan / Urgent Care Course:  Pertinent labs & imaging results that were available during my care of the patient were personally reviewed by me and considered in my medical decision making (see lab/imaging section of note for values and interpretations).  Alisha Kim is a 49 y.o. female who presents to [facility] today with complaints of sinus pressure, diagnosed with bacterial sinusitis, and treated as such with the medications below. NP and patient reviewed discharge instructions below during  visit.   Patient is well appearing overall in clinic today. She does not appear to be in any acute distress. Presenting symptoms (see HPI) and exam as documented above.   I have reviewed the follow up and strict return precautions for any new or worsening symptoms. Patient is aware of symptoms that would be deemed urgent/emergent, and would thus require further evaluation either here or in the emergency department. At the time of discharge, she verbalized understanding and consent with the discharge plan as it was reviewed with her. All questions were fielded by provider and/or clinic staff prior to patient discharge.    Final Clinical Impressions / Urgent Care Diagnoses:   Final diagnoses:  Acute non-recurrent sinusitis, unspecified location    New Prescriptions:  [place] Controlled Substance Registry consulted? Not Applicable  Meds ordered this encounter  Medications  . cefdinir (OMNICEF) 300 MG capsule    Sig: Take 1 capsule (300 mg total) by mouth 2 (two) times daily.    Dispense:  20 capsule    Refill:  0  . predniSONE (DELTASONE) 50 MG tablet    Sig: Take 1 tablet (50 mg total) by mouth daily with breakfast.    Dispense:  5 tablet    Refill:  0      Discharge Instructions     You were seen for sinus pressure and pain and are being treated for sinusitis.   Take the antibiotics as prescribed until they're finished. If you think you're having a reaction, stop the medication, take benadryl and go to the nearest urgent care/emergency room. Take a probiotic while taking the antibiotic to decrease the chances of stomach upset.   Use over-the-counter Flonase and cetirizine (Zyrtec) to help treat your postnasal drainage. ---- Take care, Dr. Marland Kitchen, NP-c     Recommended Follow up Care:  Patient encouraged to follow up with the following provider within the specified time frame, or sooner as dictated by the severity of her symptoms. As always, she was instructed that for any  urgent/emergent care needs, she should seek care either here or in the emergency department for more immediate evaluation.   Alisha Kim  Marland Kitchen, DNP, NP-c    Gertie Baron, NP 12/10/19 [phone]

## 2019-12-10 NOTE — Discharge Instructions (Addendum)
You were seen for sinus pressure and pain and are being treated for sinusitis.   Take the antibiotics as prescribed until they're finished. If you think you're having a reaction, stop the medication, take benadryl and go to the nearest urgent care/emergency room. Take a probiotic while taking the antibiotic to decrease the chances of stomach upset.   Use over-the-counter Flonase and cetirizine (Zyrtec) to help treat your postnasal drainage. ---- Take care, Dr. Marland Kitchen, NP-c

## 2019-12-10 NOTE — ED Triage Notes (Signed)
Patient c/o sinus congestion and pressure and bilateral ear pain and post nasal drip that started 2-3 days ago.  Patient reports low grade fevers.

## 2020-02-13 ENCOUNTER — Encounter: Payer: Self-pay | Admitting: Family Medicine

## 2020-02-16 NOTE — Progress Notes (Signed)
Name: Alisha Kim   MRN: [id number]    DOB: 10/21/1970   Date:02/17/2020       Progress Note  Chief Complaint  Patient presents with  . Anxiety    medication cymbalta not working  . Depression     Subjective:   Alisha Kim is a 49 y.o. 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  For over the past 1.5 years she has worked much longer hours, on call all the time, the COVID surges and nature of her duties have been very difficult and wearing for her.  She has been depressed and anxious.  With last surge she did not feel like she could do it anymore.  She is one of th only people that can use a certain system for the data analysis.  She notes increasing tensions and work place stress/irritability.  She is tearful throughout visit. She submitted her resignation last month after asking for a way to transfer out of the [place] team.  They were unable to do this and had begged her to work a 3 week notice.  Her last day was 02/02/2020. She has been feeling guilty about not working although she financially planned for this.  She has attempted to find new job but she is just not herself. Additionally her niece was just buried yesterday, she shows me the pamphlet from her service.  Her niece was young, has leukemia.  Pt is tearful, angry, upset, extremely sad with grief and loss, she is having severe anxiety about many things.   Depression screen [facility] 2/9 02/17/2020 10/08/2018 07/07/2018  Decreased Interest 1 0 0  Down, Depressed, Hopeless 1 1 0  PHQ - 2 Score 2 1 0  Altered sleeping 2 1 -  Tired, decreased energy 2 3 -  Change in appetite 2 1 -  Feeling bad or failure about yourself  1 0 -  Trouble concentrating 2 1 -  Moving slowly or  fidgety/restless 3 0 -  Suicidal thoughts 0 0 -  PHQ-9 Score 14 7 -  Difficult doing work/chores Somewhat difficult Somewhat difficult -   GAD 7 : Generalized Anxiety Score 02/17/2020 10/08/2018  Nervous, Anxious, on Edge 3 3  Control/stop worrying 2 3  Worry too much - different things 1 3  Trouble relaxing 1 2  Restless 1 0  Easily annoyed or irritable 1 2  Afraid - awful might happen 1 0  Total GAD 7 Score 10 13  Anxiety Difficulty Somewhat difficult Somewhat difficult   In the past she was given cymbalta which did little for anxiety sx, she tried hydroxyzine and it helped but made her very sleepy A while ago she took lexapro when she first had anxiety sx - it was helpful and she remembers being on a low dose.  Both PHQ and GAD7 are positive today  There is a past family hx in both parents of "mental illness", drug abuse and alcohol abuse.   Current Outpatient Medications:  .  albuterol (PROAIR HFA) 108 (90 Base) MCG/ACT inhaler, Inhale 1-2 puffs into the lungs every 4 (four) hours as needed for wheezing or shortness of breath., Disp: 1 Inhaler, Rfl: 1 .  amLODipine (NORVASC)  5 MG tablet, TAKE 1 TABLET (5 MG TOTAL) BY MOUTH EVERY EVENING., Disp: 90 tablet, Rfl: 3 .  DULoxetine (CYMBALTA) 30 MG capsule, Take 2 capsules (60 mg total) by mouth daily. Take 1 tablet daily for one week then take 2 tablets daily thereafter., Disp: 180 capsule, Rfl: 1 .  fluticasone (FLOVENT HFA) 110 MCG/ACT inhaler, Inhale 2 puffs into the lungs 2 (two) times a day. Rinse mouth out after 2nd puff; use every day, Disp: 1 Inhaler, Rfl: 12 .  PAZEO 0.7 % SOLN, Place 2 drops into both eyes as needed., Disp: , Rfl: 2  Patient Active Problem List   Diagnosis Date Noted  . Morbid obesity ([place]) 05/31/2018  . Colon cancer screening   . Allergic rhinitis 03/01/2018  . Cervical stenosis of spinal canal 08/28/2017  . S/P vaginal hysterectomy 09/29/2016  . History of Helicobacter pylori infection 08/08/2016  .  Microcytosis 08/01/2016  . Heel spur, left 04/07/2016  . Bilateral foot pain 04/04/2016  . Anxiety 04/04/2016  . Hypertension, goal below 140/90 07/24/2015  . Bilateral hip pain 11/17/2014  . Atopic dermatitis 11/17/2014  . Low serum vitamin D 11/17/2014  . Pre-diabetes 11/17/2014  . Depression with anxiety 11/17/2014  . Mild persistent asthma with allergic rhinitis without complication 22/07/5425    Past Surgical History:  Procedure Laterality Date  . ABDOMINAL HYSTERECTOMY    . COLONOSCOPY WITH PROPOFOL N/A 05/17/2018   Procedure: COLONOSCOPY WITH PROPOFOL;  Surgeon: Lin Landsman, MD;  Location: [facility];  Service: Gastroenterology;  Laterality: N/A;  . DENTAL SURGERY    . NO PAST SURGERIES    . TONSILLECTOMY    . VAGINAL HYSTERECTOMY Bilateral 09/29/2016   Procedure: HYSTERECTOMY VAGINAL WITH BILATERAL SALPINGECTOMY;  Surgeon: [name], MD;  Location: [facility] ORS;  Service: Gynecology;  Laterality: Bilateral;    Family History  Problem Relation Age of Onset  . Alcohol abuse Mother   . Arthritis Mother   . Asthma Mother   . Depression Mother   . Drug abuse Mother   . Hypertension Mother   . Mental illness Mother   . Cancer Mother        breast and lung  . Diabetes Mother   . Breast cancer Mother 41  . Alcohol abuse Father   . Drug abuse Father   . Cancer Father        lung  . Hypertension Brother   . Hearing loss Maternal Aunt   . Hypertension Maternal Aunt   . Stroke Maternal Aunt   . Alzheimer's disease Maternal Aunt   . Breast cancer Maternal Aunt        mat great aunt  . Cancer Maternal Grandmother        cervical  . Heart disease Maternal Grandmother   . Hypertension Maternal Grandmother     Social History   Tobacco Use  . Smoking status: Never Smoker  . Smokeless tobacco: Never Used  Vaping Use  . Vaping Use: Never used  Substance Use Topics  . Alcohol use: Not Currently    Alcohol/week: 0.0 standard drinks  . Drug use: No      Allergies  Allergen Reactions  . Meloxicam Other (See Comments)    Felt horrible, headache, bloating, back pain, SHOB; no rash  . Zithromax [Azithromycin] Swelling    Tongue   . Pseudoephedrine-Guaifenesin Swelling    Of tongue  . Shellfish Allergy Rash  . Shellfish-Derived Products Rash    Health Maintenance  Topic Date Due  .  MAMMOGRAM  12/22/2018  . PAP SMEAR-Modifier  11/12/2019  . INFLUENZA VACCINE  01/08/2020  . TETANUS/TDAP  06/09/2024  . COVID-19 Vaccine  Completed  . Hepatitis C Screening  Completed  . HIV Screening  Completed    Chart Review Today: I personally reviewed active problem list, medication list, allergies, family history, social history, health maintenance, notes from last encounter, lab results, imaging with the patient/caregiver today.   Review of Systems  10 Systems reviewed and are negative for acute change except as noted in the HPI.  Objective:   Vitals:   02/17/20 0949  BP: 126/86  Pulse: 84  Resp: 18  Temp: 98 F (36.7 C)  TempSrc: Oral  SpO2: 98%  Weight: 257 lb 1.6 oz (116.6 kg)  Height: 5\' 6"  (1.676 m)    Body mass index is 41.5 kg/m.  Physical Exam Vitals and nursing note reviewed.  Constitutional:      General: She is not in acute distress.    Appearance: Normal appearance. She is well-developed. She is obese. She is not ill-appearing, toxic-appearing or diaphoretic.  HENT:     Head: Normocephalic and atraumatic.     Nose: Nose normal.  Eyes:     General:        Right eye: No discharge.        Left eye: No discharge.     Conjunctiva/sclera: Conjunctivae normal.  Neck:     Trachea: No tracheal deviation.  Cardiovascular:     Rate and Rhythm: Normal rate and regular rhythm.  Pulmonary:     Effort: Pulmonary effort is normal. No respiratory distress.     Breath sounds: No stridor.  Musculoskeletal:        General: Normal range of motion.  Skin:    General: Skin is warm and dry.     Findings: No rash.   Neurological:     Mental Status: She is alert.     Motor: No abnormal muscle tone.     Coordination: Coordination normal.  Psychiatric:        Attention and Perception: Attention normal.        Mood and Affect: Mood is anxious and depressed. Affect is angry and tearful.        Speech: Speech normal.        Behavior: Behavior normal. Behavior is cooperative.        Thought Content: Thought content normal. Thought content does not include homicidal or suicidal ideation. Thought content does not include homicidal or suicidal plan.        Cognition and Memory: Cognition and memory normal.        Judgment: Judgment normal.         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  1. Current moderate episode of major depressive disorder, unspecified whether recurrent (HCC) - hydrOXYzine (ATARAX/VISTARIL) 10 MG tablet; Take 1-2 tablets (10-20 mg total) by mouth every 6 (six) hours as needed for itching.  Dispense: 120 tablet; Refill: 2 - escitalopram (LEXAPRO) 5 MG tablet; Start 5 mg po once daily x 1 week and increase to 10 mg PO once daily  Dispense: 60 tablet; Refill: 0 - escitalopram (LEXAPRO) 10 MG tablet; Take 1 tablet (10 mg total) by mouth daily.  Dispense: 30 tablet; Refill: 5  2. Anxiety -  hydrOXYzine (ATARAX/VISTARIL) 10 MG tablet; Take 1-2 tablets (10-20 mg total) by mouth every 6 (six) hours as needed for itching.  Dispense: 120 tablet; Refill: 2 - escitalopram (LEXAPRO) 5 MG tablet; Start 5 mg po once daily x 1 week and increase to 10 mg PO once daily  Dispense: 60 tablet; Refill: 0 - escitalopram (LEXAPRO) 10 MG tablet; Take 1 tablet (10 mg total) by mouth daily.  Dispense: 30 tablet; Refill: 5  3. Grief reaction - hydrOXYzine (ATARAX/VISTARIL) 10 MG tablet; Take 1-2 tablets (10-20 mg total) by  mouth every 6 (six) hours as needed for itching.  Dispense: 120 tablet; Refill: 2 - ALPRAZolam (XANAX) 0.25 MG tablet; Take 1-2 tablets (0.25-0.5 mg total) by mouth 2 (two) times daily as needed for anxiety (panic attacks).  Dispense: 20 tablet; Refill: 0  4. Stress reaction - hydrOXYzine (ATARAX/VISTARIL) 10 MG tablet; Take 1-2 tablets (10-20 mg total) by mouth every 6 (six) hours as needed for itching.  Dispense: 120 tablet; Refill: 2  5. Need for influenza vaccination - Flu Vaccine QUAD 6+ mos PF IM (Fluarix Quad PF)   PLAN for dx #1-4 above:  (wording was made directly for patient)  "Start lexapro at 5 mg and then increase to 10 mg daily  You can use hydroxyzine 1-2 tablets up to three times a day as needed to help with anxiety symptoms.  For SEVERE anxiety symptoms or panic attacks you can try the xanax - be very careful with this.  There is risk of sedation and addiction - so use sparingly  Try to call your insurance and see what psychiatrist or therapist (psychologist) are in network and call to see who is accepting new patients.  I want to see you again in 1 month.  Please call us or check in sooner if you are having worsening symptoms, or if you need me to put in a referral for your insurance."  Several lists of local resources were given to pt Encouraged her to be patient with her emotional and mental time and recovery I encouraged her to give herself a few more weeks before attempting interviews - be patient with herself, allow some bad day, and give herself some small goals each day - go out walking 10-20 min a day, set a small goal for something she needs to get done at home, something she would enjoy doing each day Strongly suggested therapy/counseling - grief counseling  With her past med hx and family hx - she would likely do very well with full formal psychiatric eval and management. We reviewed normal sx for grief and stress - ample time today spent with pt listening  and for emotional support.  Reviewed meds, explained benzos risks/dangers and will not refill - will need to use very sparingly All questions asked and answered today  No current SI, feel pts behavior and emotions all very appropriate given all she's been through I encouraged her to reach out and lmk if she needs a referral placed.   Greater than 50% of this visit was spent in direct face-to-face counseling, obtaining history and physical, discussing and educating pt on treatment plan.  Total time of this visit was 50+ min.  Remainder of time involved but was not limited to reviewing chart (recent and pertinent OV notes and labs), documentation in EMR, and coordinating care and treatment plan.    Return in about 1 month (around 03/18/2020) for f/up anxiety/grief/stress reaction med check.   Delsa Grana, PA-C 02/17/20 9:55 AM

## 2020-02-17 ENCOUNTER — Other Ambulatory Visit: Payer: Self-pay

## 2020-02-17 ENCOUNTER — Encounter: Payer: Self-pay | Admitting: Family Medicine

## 2020-02-17 ENCOUNTER — Ambulatory Visit (INDEPENDENT_AMBULATORY_CARE_PROVIDER_SITE_OTHER): Payer: No Typology Code available for payment source | Admitting: Family Medicine

## 2020-02-17 VITALS — BP 126/86 | HR 84 | Temp 98.0°F | Resp 18 | Ht 66.0 in | Wt 257.1 lb

## 2020-02-17 DIAGNOSIS — F321 Major depressive disorder, single episode, moderate: Secondary | ICD-10-CM | POA: Diagnosis not present

## 2020-02-17 DIAGNOSIS — Z23 Encounter for immunization: Secondary | ICD-10-CM | POA: Diagnosis not present

## 2020-02-17 DIAGNOSIS — F430 Acute stress reaction: Secondary | ICD-10-CM | POA: Diagnosis not present

## 2020-02-17 DIAGNOSIS — F4321 Adjustment disorder with depressed mood: Secondary | ICD-10-CM

## 2020-02-17 DIAGNOSIS — F4320 Adjustment disorder, unspecified: Secondary | ICD-10-CM | POA: Insufficient documentation

## 2020-02-17 DIAGNOSIS — F419 Anxiety disorder, unspecified: Secondary | ICD-10-CM | POA: Diagnosis not present

## 2020-02-17 MED ORDER — ESCITALOPRAM OXALATE 5 MG PO TABS: ORAL_TABLET | ORAL | 0 refills | Status: DC

## 2020-02-17 MED ORDER — HYDROXYZINE HCL 10 MG PO TABS: 10.0000 mg | ORAL_TABLET | Freq: Four times a day (QID) | ORAL | 2 refills | Status: DC | PRN

## 2020-02-17 MED ORDER — ESCITALOPRAM OXALATE 10 MG PO TABS: 10.0000 mg | ORAL_TABLET | Freq: Every day | ORAL | 5 refills | Status: DC

## 2020-02-17 MED ORDER — ALPRAZOLAM 0.25 MG PO TABS: 0.2500 mg | ORAL_TABLET | Freq: Two times a day (BID) | ORAL | 0 refills | Status: DC | PRN

## 2020-02-17 NOTE — Patient Instructions (Signed)
Start lexapro at 5 mg and then increase to 10 mg daily  You can use hydroxyzine 1-2 tablets up to three times a day as needed to help with anxiety symptoms.  For SEVERE anxiety symptoms or panic attacks you can try the xanax - be very careful with this.  There is risk of sedation and addiction - so use sparingly  Try to call your insurance and see what psychiatrist or therapist (psychologist) are in network and call to see who is accepting new patients.  I want to see you again in 1 month.  Please call us or check in sooner if you are having worsening symptoms, or if you need me to put in a referral for your insurance.       Here are some resources to help you if you feel you are in a mental health crisis:  [facility] - Call [phone]  for help - Website with more resources: GripTrip.com.pt  [facility] - Call [phone] for help. - Mobile Crisis Program available 24 hours a day, 365 days a year. - Available for anyone of any age in [place] counties.  [facility] - Address: [address], [place] - Telephone: [phone]  - Hours of Operation: Sunday - Saturday - 8:00 a.m. - 8:00 p.m. - Medicaid, Medicare (Government Issued Only), BCBS, and [facility], [facility], Psychiatrists on-site to provide medication management, [place], and Peer Support Care.  [facility] - Call [phone] for help. - Mobile Crisis Program available 24 hours a day, 365 days a year. - Available for anyone of any age in [place]:  Dr. Modena Morrow - [phone] [facility]) - [facility] [place]) - [phone] Dr. Chucky May [facility]) [phone] [facility] [place]) [phone] [place] ([place]) - [phone] [facility] [place]) - [place], [address] [place], [place]

## 2020-02-20 ENCOUNTER — Telehealth: Payer: Self-pay | Admitting: *Deleted

## 2020-02-20 NOTE — Chronic Care Management (AMB) (Signed)
  Care Management   Note  02/20/2020 Name: Alisha Kim MRN: [id number] DOB: Oct 19, 1970  Alisha Kim is a 49 y.o. year old female who is a primary care patient of Delsa Grana, [place]. I reached out to [name] by phone today in response to a referral sent by Alisha Kim's PCP, Delsa Grana, PA-C.    [name] was given information about care management services today including:  1. Care management services include personalized support from designated clinical staff supervised by her physician, including individualized plan of care and coordination with other care providers 2. 24/7 contact phone numbers for assistance for urgent and routine care needs. 3. The patient may stop care management services at any time by phone call to the office staff.  Patient agreed to services and verbal consent obtained.   Follow up plan: Telephone appointment with care management team member scheduled for: 02/22/2020  [place] [facility]

## 2020-02-22 ENCOUNTER — Telehealth: Payer: No Typology Code available for payment source

## 2020-02-24 ENCOUNTER — Ambulatory Visit: Payer: No Typology Code available for payment source | Admitting: *Deleted

## 2020-02-24 NOTE — Chronic Care Management (AMB) (Signed)
   Care Management    Clinical Social Work Follow Up Note  02/24/2020 Name: Alisha Kim MRN: [id number] DOB: 06-13-1970  Alisha Kim is a 49 y.o. year old female who is a primary care patient of Delsa Grana, [place]. The CCM team was consulted for assistance with Mental Health Counseling and Resources.   Phone call to patient to complete needs assessment and offer emotional support related to grief and loss. Patient discussed increased difficulty working through her grief. Patient interested in grief support and has already contacted [facility] to set up an initial assessment. Patient also provided with contact information for Hospice and Palliative Care. Patient provided with emotional support related to her anxiety and gried with emphasis on self care. Patient describes a positive  support network with plan to follow up with the [facility] as well as Hospice and Palliative Care for additional grief support.  Review of patient status, including review of consultants reports, other relevant assessments, and collaboration with appropriate care team members and the patient's provider was performed as part of comprehensive patient evaluation and provision of chronic care management services.    SDOH (Social Determinants of Health) assessments performed: Yes SDOH Interventions     Most Recent Value  SDOH Interventions  SDOH Interventions for the Following Domains Depression  Depression Interventions/Treatment  Counseling  [patient referred to [facility] as well as Hospice and Palliative Care for additional grief support]       Outpatient Encounter Medications as of 02/24/2020  Medication Sig  . albuterol (PROAIR HFA) 108 (90 Base) MCG/ACT inhaler Inhale 1-2 puffs into the lungs every 4 (four) hours as needed for wheezing or shortness of breath.  . ALPRAZolam (XANAX) 0.25 MG tablet Take 1-2 tablets (0.25-0.5 mg total) by mouth 2 (two) times daily as needed  for anxiety (panic attacks).  Marland Kitchen amLODipine (NORVASC) 5 MG tablet TAKE 1 TABLET (5 MG TOTAL) BY MOUTH EVERY EVENING.  . DULoxetine (CYMBALTA) 30 MG capsule Take 2 capsules (60 mg total) by mouth daily. Take 1 tablet daily for one week then take 2 tablets daily thereafter.  Derrill Memo ON 03/12/2020] escitalopram (LEXAPRO) 10 MG tablet Take 1 tablet (10 mg total) by mouth daily.  Marland Kitchen escitalopram (LEXAPRO) 5 MG tablet Start 5 mg po once daily x 1 week and increase to 10 mg PO once daily  . fluticasone (FLOVENT HFA) 110 MCG/ACT inhaler Inhale 2 puffs into the lungs 2 (two) times a day. Rinse mouth out after 2nd puff; use every day  . hydrOXYzine (ATARAX/VISTARIL) 10 MG tablet Take 1-2 tablets (10-20 mg total) by mouth every 6 (six) hours as needed for itching.  Marland Kitchen PAZEO 0.7 % SOLN Place 2 drops into both eyes as needed.   No facility-administered encounter medications on file as of 02/24/2020.     Goals Addressed   None      Follow Up Plan: Client will follow up with the [facility] and Hospice and Palliative Care for Grief Counseling. Patient to call this social worker with any questions or concerns regarding her mental health needs   Elliot Gurney, [place] Administrator, [facility] Care Management [phone]

## 2020-03-09 ENCOUNTER — Encounter: Payer: Self-pay | Admitting: Gastroenterology

## 2020-03-09 ENCOUNTER — Other Ambulatory Visit: Payer: Self-pay

## 2020-03-09 ENCOUNTER — Ambulatory Visit (INDEPENDENT_AMBULATORY_CARE_PROVIDER_SITE_OTHER): Payer: 59 | Admitting: Gastroenterology

## 2020-03-09 VITALS — BP 129/83 | HR 79 | Temp 98.0°F | Ht 66.0 in | Wt 259.1 lb

## 2020-03-09 DIAGNOSIS — J45909 Unspecified asthma, uncomplicated: Secondary | ICD-10-CM

## 2020-03-09 DIAGNOSIS — K760 Fatty (change of) liver, not elsewhere classified: Secondary | ICD-10-CM | POA: Diagnosis not present

## 2020-03-09 NOTE — Progress Notes (Signed)
[name], MD [address]  [place]  [place], [place] [address]  Main: [phone]  Fax: [phone]    Gastroenterology Consultation  Referring Provider:     Delsa Grana, PA-C Primary Care Physician:  Delsa Grana, PA-C Primary Gastroenterologist:  Dr. [name] Reason for Consultation:     Elevated alkaline phosphatase        HPI:   Alisha Kim is a 49 y.o. female referred by Dr. Delsa Grana, PA-C  for consultation & management of elevated alkaline phosphatase.  This was first noted to be abnormal in 08/2017.  The levels were slightly more than normal.  Isoenzymes came back positive for liver.  Rest of the liver enzymes were normal.  TSH was normal.  She had ultrasound of the liver which was unremarkable.  She does not have chronic hepatitis B and C.  HIV negative.  Patient reports that she has been gaining weight in last 1 year significantly.  She does drink sodas regularly, consumes red meat, hamburger regularly.  She denies any GI symptoms.  She is also overdue for colon cancer screening.  Follow-up visit 03/09/2020 Patient is here for follow-up of chronic liver disease.  She does have biopsy-proven fatty liver.  She denies any GI symptoms today.  She continues to gain weight.  She has not adopted a healthy lifestyle discussed during last visit. She does report having asthma attacks about 2 to 3 times per year needing short course of prednisone.  She does not have a pulmonologist.  NSAIDs: None  Antiplts/Anticoagulants/Anti thrombotics: None  GI Procedures:  Screening colonoscopy 05/17/2018 - One diminutive polyp in the sigmoid colon, removed with a cold biopsy forceps. Resected and retrieved. - Mild diverticulosis in the sigmoid colon. There was no evidence of diverticular bleeding. - The examined portion of the ileum was normal. - The examination was otherwise normal. - The distal rectum and anal verge are normal on retroflexion view.  DIAGNOSIS:  A.  COLON POLYP, SIGMOID; BIOPSY:  - TUBULAR ADENOMA.  - NEGATIVE FOR HIGH-GRADE DYSPLASIA AND MALIGNANCY.   She denies family history of GI malignancy  Past Medical History:  Diagnosis Date  . Adenomyosis 09/2015  . Allergy   . Anemia    history of  . Anxiety    controlled;   . Asthma   . Depression with anxiety   . Diabetes mellitus without complication ([place])    was told she was pre-diabetic  . Fibroids 08/14/2016   [place] March 2018  . History of Helicobacter pylori infection 08/08/2016   2004  . 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 Hypertension    controlled with medication;   . Pre-diabetes     Past Surgical History:  Procedure Laterality Date  . ABDOMINAL HYSTERECTOMY    . COLONOSCOPY WITH PROPOFOL N/A 05/17/2018   Procedure: COLONOSCOPY WITH PROPOFOL;  Surgeon: Lin Landsman, MD;  Location: [facility] ENDOSCOPY;  Service: Gastroenterology;  Laterality: N/A;  . DENTAL SURGERY    . NO PAST SURGERIES    . TONSILLECTOMY    . VAGINAL HYSTERECTOMY Bilateral 09/29/2016   Procedure: HYSTERECTOMY VAGINAL WITH BILATERAL SALPINGECTOMY;  Surgeon: [name], MD;  Location: [facility] ORS;  Service: Gynecology;  Laterality: Bilateral;     Current Outpatient Medications:  .  albuterol (PROAIR HFA) 108 (90 Base) MCG/ACT inhaler, Inhale 1-2 puffs into the lungs every 4 (four) hours as needed for wheezing or shortness of breath., Disp: 1 Inhaler, Rfl: 1 .  ALPRAZolam Duanne Moron)  0.25 MG tablet, Take 1-2 tablets (0.25-0.5 mg total) by mouth 2 (two) times daily as needed for anxiety (panic attacks)., Disp: 20 tablet, Rfl: 0 .  amLODipine (NORVASC) 5 MG tablet, TAKE 1 TABLET (5 MG TOTAL) BY MOUTH EVERY EVENING., Disp: 90 tablet, Rfl: 3 .  [START ON 03/12/2020] escitalopram (LEXAPRO) 10 MG tablet, Take 1 tablet (10 mg total) by mouth daily., Disp: 30 tablet, Rfl: 5 .  fluticasone (FLOVENT HFA) 110 MCG/ACT inhaler, Inhale 2 puffs into the lungs 2 (two) times a day. Rinse mouth out after 2nd puff; use every day,  Disp: 1 Inhaler, Rfl: 12 .  hydrOXYzine (ATARAX/VISTARIL) 10 MG tablet, Take 1-2 tablets (10-20 mg total) by mouth every 6 (six) hours as needed for itching., Disp: 120 tablet, Rfl: 2 .  PAZEO 0.7 % SOLN, Place 2 drops into both eyes as needed., Disp: , Rfl: 2   Family History  Problem Relation Age of Onset  . Alcohol abuse Mother   . Arthritis Mother   . Asthma Mother   . Depression Mother   . Drug abuse Mother   . Hypertension Mother   . Mental illness Mother   . Cancer Mother        breast and lung  . Diabetes Mother   . Breast cancer Mother 33  . Alcohol abuse Father   . Drug abuse Father   . Cancer Father        lung  . Hypertension Brother   . Hearing loss Maternal Aunt   . Hypertension Maternal Aunt   . Stroke Maternal Aunt   . Alzheimer's disease Maternal Aunt   . Breast cancer Maternal Aunt        mat great aunt  . Cancer Maternal Grandmother        cervical  . Heart disease Maternal Grandmother   . Hypertension Maternal Grandmother      Social History   Tobacco Use  . Smoking status: Never Smoker  . Smokeless tobacco: Never Used  Vaping Use  . Vaping Use: Never used  Substance Use Topics  . Alcohol use: Not Currently    Alcohol/week: 0.0 standard drinks  . Drug use: No    Allergies as of 03/09/2020 - Review Complete 03/09/2020  Allergen Reaction Noted  . Meloxicam Other (See Comments) 06/06/2016  . Zithromax [azithromycin] Swelling 11/17/2014  . Pseudoephedrine-guaifenesin Swelling 03/15/2015  . Shellfish allergy Rash 11/17/2014  . Shellfish-derived products Rash 04/12/2014    Review of Systems:    All systems reviewed and negative except where noted in HPI.   Physical Exam:  BP 129/83 (BP Location: Left Arm, Patient Position: Sitting, Cuff Size: Large)   Pulse 79   Temp 98 F (36.7 C) (Oral)   Ht 5\' 6"  (1.676 m)   Wt 259 lb 2 oz (117.5 kg)   LMP 09/29/2016 Comment: partial  BMI 41.82 kg/m  Patient's last menstrual period was  09/29/2016.  General:   Alert,  Well-developed, well-nourished, pleasant and cooperative in NAD Head:  Normocephalic and atraumatic. Eyes:  Sclera clear, no icterus.   Conjunctiva pink. Ears:  Normal auditory acuity. Nose:  No deformity, discharge, or lesions. Mouth:  No deformity or lesions,oropharynx pink & moist. Neck:  Supple; no masses or thyromegaly. Lungs:  Respirations even and unlabored.  Clear throughout to auscultation.   No wheezes, crackles, or rhonchi. No acute distress. Heart:  Regular rate and rhythm; no murmurs, clicks, rubs, or gallops. Abdomen:  Normal bowel sounds. Soft, non-tender and non-distended  without masses, hepatosplenomegaly or hernias noted.  No guarding or rebound tenderness.   Rectal: Not performed Msk:  Symmetrical without gross deformities. Good, equal movement & strength bilaterally. Pulses:  Normal pulses noted. Extremities:  No clubbing or edema.  No cyanosis. Neurologic:  Alert and oriented x3;  grossly normal neurologically. Skin:  Intact without significant lesions or rashes. No jaundice. Psych:  Alert and cooperative. Normal mood and affect.  Imaging Studies: Reviewed  Assessment and Plan:   Alisha Kim is a 49 y.o. African-American female with morbid obesity, hypertension with isolated elevated alkaline phosphatase since 08/2017.  Isoenzymes confirmed that alkaline phosphatase is coming from liver.  Fatty liver, biopsy-proven with no evidence of fibrosis Secondary liver disease work-up is negative Highly encouraged her to lose weight, cut back on sodas, red meat and carbs Provided her with information regarding [facility] Recheck LFTs today and annually  Morbid obesity Likely combination of prednisone use and lifestyle Will refer her to pulmonologist for management of asthma as she has been having frequent flareups requiring short courses of prednisone  Follow up every 6 months   [name], MD

## 2020-03-10 LAB — HEPATIC FUNCTION PANEL
ALT: 25 IU/L (ref 0–32)
AST: 19 IU/L (ref 0–40)
Albumin: 3.7 g/dL — ABNORMAL LOW (ref 3.8–4.8)
Alkaline Phosphatase: 142 IU/L — ABNORMAL HIGH (ref 44–121)
Bilirubin Total: <0.2 mg/dL (ref 0.0–1.2)
Bilirubin, Direct: <0.1 mg/dL (ref 0.00–0.40)
Total Protein: 6.9 g/dL (ref 6.0–8.5)

## 2020-03-11 ENCOUNTER — Other Ambulatory Visit: Payer: Self-pay | Admitting: Family Medicine

## 2020-03-11 DIAGNOSIS — F321 Major depressive disorder, single episode, moderate: Secondary | ICD-10-CM

## 2020-03-11 DIAGNOSIS — F419 Anxiety disorder, unspecified: Secondary | ICD-10-CM

## 2020-03-13 ENCOUNTER — Telehealth: Payer: Self-pay

## 2020-03-13 DIAGNOSIS — K760 Fatty (change of) liver, not elsewhere classified: Secondary | ICD-10-CM

## 2020-03-13 NOTE — Telephone Encounter (Signed)
Patient verbalized understanding of results. Patient will come to our office to have lab work

## 2020-03-13 NOTE — Telephone Encounter (Signed)
-----   Message from Lin Landsman, MD sent at 03/13/2020  1:03 PM EDT ----- One of the liver enzymes remain elevated.  Recommend hepatic fibrosis panel which is a blood test to assess underlying liver injury from fatty liver  Rohini Vanga

## 2020-03-13 NOTE — Telephone Encounter (Signed)
Called and left a message for call back order lab work

## 2020-03-16 ENCOUNTER — Encounter: Payer: Self-pay | Admitting: Gastroenterology

## 2020-03-16 LAB — HCV FIBROSURE
ALPHA 2-MACROGLOBULINS, QN: 96 mg/dL — ABNORMAL LOW (ref 110–276)
ALT (SGPT) P5P: 35 IU/L (ref 0–40)
Apolipoprotein A-1: 108 mg/dL — ABNORMAL LOW (ref 116–209)
Bilirubin, Total: 0.2 mg/dL (ref 0.0–1.2)
Fibrosis Score: 0.02 (ref 0.00–0.21)
GGT: 11 IU/L (ref 0–60)
Haptoglobin: 138 mg/dL (ref 42–296)
Necroinflammat Activity Score: 0.13 (ref 0.00–0.17)

## 2020-03-19 ENCOUNTER — Ambulatory Visit (INDEPENDENT_AMBULATORY_CARE_PROVIDER_SITE_OTHER): Payer: 59 | Admitting: Family Medicine

## 2020-03-19 ENCOUNTER — Other Ambulatory Visit: Payer: Self-pay

## 2020-03-19 ENCOUNTER — Encounter: Payer: Self-pay | Admitting: Family Medicine

## 2020-03-19 ENCOUNTER — Telehealth: Payer: Self-pay

## 2020-03-19 VITALS — BP 116/74 | HR 97 | Temp 98.4°F | Resp 14 | Ht 68.0 in | Wt 245.6 lb

## 2020-03-19 DIAGNOSIS — F419 Anxiety disorder, unspecified: Secondary | ICD-10-CM | POA: Diagnosis not present

## 2020-03-19 DIAGNOSIS — F321 Major depressive disorder, single episode, moderate: Secondary | ICD-10-CM | POA: Diagnosis not present

## 2020-03-19 DIAGNOSIS — F430 Acute stress reaction: Secondary | ICD-10-CM | POA: Diagnosis not present

## 2020-03-19 DIAGNOSIS — F4321 Adjustment disorder with depressed mood: Secondary | ICD-10-CM | POA: Diagnosis not present

## 2020-03-19 DIAGNOSIS — J4530 Mild persistent asthma, uncomplicated: Secondary | ICD-10-CM

## 2020-03-19 DIAGNOSIS — R7303 Prediabetes: Secondary | ICD-10-CM

## 2020-03-19 DIAGNOSIS — Z5181 Encounter for therapeutic drug level monitoring: Secondary | ICD-10-CM

## 2020-03-19 DIAGNOSIS — R7989 Other specified abnormal findings of blood chemistry: Secondary | ICD-10-CM

## 2020-03-19 DIAGNOSIS — R748 Abnormal levels of other serum enzymes: Secondary | ICD-10-CM

## 2020-03-19 NOTE — Patient Instructions (Signed)
I would recommend a daily inhaler at this point and pulmonology can adjust as they do their work up.  Work on [employer] recommendations from GI  We will call you with lab results  Get a follow up in 3-4 months

## 2020-03-19 NOTE — Progress Notes (Signed)
Name: Alisha Kim   MRN: [id number]    DOB: [birth year]   Date:03/21/2020       Progress Note  Chief Complaint  Patient presents with  . Follow-up  . Depression  . Anxiety     Subjective:   Alisha Kim is a 49 y.o. 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[employer], recently it was overwhelming, she resigned from her job, also lost a niece, and she was feeling unmanagable anxiety and depression She started on lexapro 5 mg and increased to 10 mg daily also taking hydroxyzine prn  Previously on cymbalta and short course prn xanax  GAD 7 : Generalized Anxiety Score 02/17/2020 10/08/2018  Nervous, Anxious, on Edge 3 3  Control/stop worrying 2 3  Worry too much - different things 1 3  Trouble relaxing 1 2  Restless 1 0  Easily annoyed or irritable 1 2  Afraid - awful might happen 1 0  Total GAD 7 Score 10 13  Anxiety Difficulty Somewhat difficult Somewhat difficult    Depression screen [facility] 2/9 03/19/2020 02/24/2020 02/17/2020  Decreased Interest 0 1 1  Down, Depressed, Hopeless 0 1 1  PHQ - 2 Score 0 2 2  Altered sleeping 0 2 2  Tired, decreased energy 0 2 2  Change in appetite 0 2 2  Feeling bad or failure about yourself  0 1 1  Trouble concentrating 0 2 2  Moving slowly or fidgety/restless 0 1 3  Suicidal thoughts 0 0 0  PHQ-9 Score 0 12 14  Difficult doing work/chores Not difficult at all - Somewhat difficult    She has multiple issues/conditions that she was previously managing with PCP from 2 years ago, but has not been in since, including obesity, prediabetes, Vit D deficiency, HTN, liver disease/elevated Alk phos managed by GI  Reviewed past labs and more recent GI visit and lab results, pt anxious about liver function She has a new diet to follow  Also has been able to relax and recouperate at home. She has good family support, has touched base with a  faith based program that has been helpful. She had a job interview this am, feels good about it.     Current Outpatient Medications:  .  albuterol (PROAIR HFA) 108 (90 Base) MCG/ACT inhaler, Inhale 1-2 puffs into the lungs every 4 (four) hours as needed for wheezing or shortness of breath., Disp: 1 Inhaler, Rfl: 1 .  ALPRAZolam (XANAX) 0.25 MG tablet, Take 1-2 tablets (0.25-0.5 mg total) by mouth 2 (two) times daily as needed for anxiety (panic attacks)., Disp: 20 tablet, Rfl: 0 .  amLODipine (NORVASC) 5 MG tablet, TAKE 1 TABLET (5 MG TOTAL) BY MOUTH EVERY EVENING., Disp: 90 tablet, Rfl: 3 .  escitalopram (LEXAPRO) 10 MG tablet, Take 1 tablet (10 mg total) by mouth daily., Disp: 30 tablet, Rfl: 5 .  fluticasone (FLOVENT HFA) 110 MCG/ACT inhaler, Inhale 2 puffs into the lungs 2 (two) times a day. Rinse mouth out after 2nd puff; use every day, Disp: 1 Inhaler, Rfl: 12 .  hydrOXYzine (ATARAX/VISTARIL) 10 MG tablet, Take 1-2 tablets (10-20 mg total) by mouth every 6 (six) hours as needed for itching., Disp: 120 tablet, Rfl: 2 .  PAZEO 0.7 % SOLN, Place 2 drops into both eyes as needed., Disp: , Rfl: 2  Patient Active Problem List   Diagnosis Date Noted  .  Current moderate episode of major depressive disorder ([place]) 02/17/2020  . Grief reaction 02/17/2020  . Stress reaction 02/17/2020  . Morbid obesity ([place]) 05/31/2018  . Colon cancer screening   . Allergic rhinitis 03/01/2018  . Cervical stenosis of spinal canal 08/28/2017  . S/P vaginal hysterectomy 09/29/2016  . History of Helicobacter pylori infection 08/08/2016  . Microcytosis 08/01/2016  . Heel spur, left 04/07/2016  . Bilateral foot pain 04/04/2016  . Anxiety 04/04/2016  . Hypertension, goal below 140/90 07/24/2015  . Bilateral hip pain 11/17/2014  . Atopic dermatitis 11/17/2014  . Low serum vitamin D 11/17/2014  . Pre-diabetes 11/17/2014  . Depression with anxiety 11/17/2014  . Mild persistent asthma with allergic rhinitis  without complication 56/21/3086    Past Surgical History:  Procedure Laterality Date  . ABDOMINAL HYSTERECTOMY    . COLONOSCOPY WITH PROPOFOL N/A 05/17/2018   Procedure: COLONOSCOPY WITH PROPOFOL;  Surgeon: Lin Landsman, MD;  Location: [facility] ENDOSCOPY;  Service: Gastroenterology;  Laterality: N/A;  . DENTAL SURGERY    . NO PAST SURGERIES    . TONSILLECTOMY    . VAGINAL HYSTERECTOMY Bilateral 09/29/2016   Procedure: HYSTERECTOMY VAGINAL WITH BILATERAL SALPINGECTOMY;  Surgeon: [name], MD;  Location: [facility] ORS;  Service: Gynecology;  Laterality: Bilateral;    Family History  Problem Relation Age of Onset  . Alcohol abuse Mother   . Arthritis Mother   . Asthma Mother   . Depression Mother   . Drug abuse Mother   . Hypertension Mother   . Mental illness Mother   . Cancer Mother        breast and lung  . Diabetes Mother   . Breast cancer Mother 10  . Alcohol abuse Father   . Drug abuse Father   . Cancer Father        lung  . Hypertension Brother   . Hearing loss Maternal Aunt   . Hypertension Maternal Aunt   . Stroke Maternal Aunt   . Alzheimer's disease Maternal Aunt   . Breast cancer Maternal Aunt        mat great aunt  . Cancer Maternal Grandmother        cervical  . Heart disease Maternal Grandmother   . Hypertension Maternal Grandmother     Social History   Tobacco Use  . Smoking status: Never Smoker  . Smokeless tobacco: Never Used  Vaping Use  . Vaping Use: Never used  Substance Use Topics  . Alcohol use: Not Currently    Alcohol/week: 0.0 standard drinks  . Drug use: No     Allergies  Allergen Reactions  . Meloxicam Other (See Comments)    Felt horrible, headache, bloating, back pain, SHOB; no rash  . Zithromax [Azithromycin] Swelling    Tongue   . Pseudoephedrine-Guaifenesin Swelling    Of tongue  . Shellfish Allergy Rash  . Shellfish-Derived Products Rash    Health Maintenance  Topic Date Due  . MAMMOGRAM  12/22/2018  . PAP  SMEAR-Modifier  11/12/2019  . TETANUS/TDAP  06/09/2024  . INFLUENZA VACCINE  Completed  . COVID-19 Vaccine  Completed  . Hepatitis C Screening  Completed  . HIV Screening  Completed    Chart Review Today: I personally reviewed active problem list, medication list, allergies, family history, social history, health maintenance, notes from last encounter, lab results, imaging with the patient/caregiver today.   Review of Systems  10 Systems reviewed and are negative for acute change except as noted in the HPI.  Objective:   Vitals:   03/19/20 1328  BP: 116/74  Pulse: 97  Resp: 14  Temp: 98.4 F (36.9 C)  SpO2: 99%  Weight: 245 lb 9.6 oz (111.4 kg)  Height: 5\' 8"  (1.727 m)    Body mass index is 37.34 kg/m.  Physical Exam Vitals and nursing note reviewed.  Constitutional:      General: She is not in acute distress.    Appearance: Normal appearance. She is well-developed. She is obese. She is not ill-appearing, toxic-appearing or diaphoretic.     Interventions: Face mask in place.  HENT:     Head: Normocephalic and atraumatic.     Right Ear: External ear normal.     Left Ear: External ear normal.  Eyes:     General: Lids are normal. No scleral icterus.       Right eye: No discharge.        Left eye: No discharge.     Conjunctiva/sclera: Conjunctivae normal.  Neck:     Trachea: Phonation normal. No tracheal deviation.  Cardiovascular:     Rate and Rhythm: Normal rate and regular rhythm.     Pulses: Normal pulses.          Radial pulses are 2+ on the right side and 2+ on the left side.       Posterior tibial pulses are 2+ on the right side and 2+ on the left side.     Heart sounds: Normal heart sounds. No murmur heard.  No friction rub. No gallop.   Pulmonary:     Effort: Pulmonary effort is normal. No respiratory distress.     Breath sounds: Normal breath sounds. No stridor. No wheezing, rhonchi or rales.  Chest:     Chest wall: No tenderness.  Abdominal:      General: Bowel sounds are normal. There is no distension.     Palpations: Abdomen is soft.  Musculoskeletal:     Right lower leg: No edema.     Left lower leg: No edema.  Skin:    General: Skin is warm and dry.     Coloration: Skin is not jaundiced or pale.     Findings: No rash.  Neurological:     Mental Status: She is alert.     Motor: No abnormal muscle tone.     Gait: Gait normal.  Psychiatric:        Mood and Affect: Mood normal.        Speech: Speech normal.        Behavior: Behavior normal.         Assessment & Plan:   1. Current moderate episode of major depressive disorder, unspecified whether recurrent (HCC) Improving sx with family support, outreach to religious/pastoral help, lexapro and hydroxyzine doing well  2. Anxiety Same as #1, improving Continue lexapro and hydroxyzine prn Encouraged continuing to establish with psychiatry or clinical psychologist for specialists professional help, family and religion are great and helpful, but they are not clinical and she would benefit from psych/therapy with meds  3. Grief reaction She has found support, grief and loss not as intense now as initially  4. Stress reaction See #3  5. Low serum vitamin D Recheck, not currently on supplements, was on prescription in the past - COMPLETE METABOLIC PANEL WITH GFR - VITAMIN D 25 Hydroxy (Vit-D Deficiency, Fractures)  6. 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 Mild persistent asthma with allergic rhinitis without complication Pt states sx stable, currently well controlled  8. Encounter for medication monitoring - COMPLETE METABOLIC PANEL WITH GFR - CBC with Differential/Platelet - Hemoglobin A1c - VITAMIN D 25 Hydroxy (Vit-D Deficiency, Fractures) - Lipid panel  9.  Elevated alkaline phosphatase level Per GI, reviewed last OV and labs - COMPLETE METABOLIC PANEL WITH GFR - VITAMIN D 25 Hydroxy (Vit-D Deficiency, Fractures)  10. Elevated liver function tests Checked hepatic dosing of all meds, encouraged healthy diet - COMPLETE METABOLIC PANEL WITH GFR - Hemoglobin A1c - Lipid panel  11. Morbid obesity ([place]) Work on decreasing calories/portions, increase activity and avoid sedentary lifestyle - COMPLETE METABOLIC PANEL WITH GFR - CBC with Differential/Platelet - Hemoglobin A1c - VITAMIN D 25 Hydroxy (Vit-D Deficiency, Fractures) - Lipid panel   Return for 3-4 month f/up .   Delsa Grana, PA-C 03/21/20 3:25 PM

## 2020-03-19 NOTE — Telephone Encounter (Signed)
Called and left a message, asked pt to call back and schedule new pt appt with dr Devona Konig. [name]

## 2020-03-20 LAB — CBC WITH DIFFERENTIAL/PLATELET
Absolute Monocytes: 359 cells/uL (ref 200–950)
Basophils Absolute: 18 cells/uL (ref 0–200)
Basophils Relative: 0.4 %
Eosinophils Absolute: 69 cells/uL (ref 15–500)
Eosinophils Relative: 1.5 %
HCT: 42 % (ref 35.0–45.0)
Hemoglobin: 13.6 g/dL (ref 11.7–15.5)
Lymphs Abs: 1693 cells/uL (ref 850–3900)
MCH: 26.4 pg — ABNORMAL LOW (ref 27.0–33.0)
MCHC: 32.4 g/dL (ref 32.0–36.0)
MCV: 81.4 fL (ref 80.0–100.0)
MPV: 10.9 fL (ref 7.5–12.5)
Monocytes Relative: 7.8 %
Neutro Abs: 2461 cells/uL (ref 1500–7800)
Neutrophils Relative %: 53.5 %
Platelets: 269 10*3/uL (ref 140–400)
RBC: 5.16 10*6/uL — ABNORMAL HIGH (ref 3.80–5.10)
RDW: 13.9 % (ref 11.0–15.0)
Total Lymphocyte: 36.8 %
WBC: 4.6 10*3/uL (ref 3.8–10.8)

## 2020-03-20 LAB — COMPLETE METABOLIC PANEL WITH GFR
AG Ratio: 1.1 (calc) (ref 1.0–2.5)
ALT: 35 U/L — ABNORMAL HIGH (ref 6–29)
AST: 24 U/L (ref 10–35)
Albumin: 4.1 g/dL (ref 3.6–5.1)
Alkaline phosphatase (APISO): 124 U/L (ref 31–125)
BUN: 11 mg/dL (ref 7–25)
CO2: 31 mmol/L (ref 20–32)
Calcium: 9.6 mg/dL (ref 8.6–10.2)
Chloride: 103 mmol/L (ref 98–110)
Creat: 0.73 mg/dL (ref 0.50–1.10)
GFR, Est African American: 113 mL/min/{1.73_m2} (ref >=60)
GFR, Est Non African American: 97 mL/min/{1.73_m2} (ref >=60)
Globulin: 3.6 g/dL (calc) (ref 1.9–3.7)
Glucose, Bld: 104 mg/dL (ref 65–139)
Potassium: 4.5 mmol/L (ref 3.5–5.3)
Sodium: 138 mmol/L (ref 135–146)
Total Bilirubin: 0.3 mg/dL (ref 0.2–1.2)
Total Protein: 7.7 g/dL (ref 6.1–8.1)

## 2020-03-20 LAB — HEMOGLOBIN A1C
Hgb A1c MFr Bld: 6.3 % of total Hgb — ABNORMAL HIGH (ref <5.7)
Mean Plasma Glucose: 134 (calc)
eAG (mmol/L): 7.4 (calc)

## 2020-03-20 LAB — LIPID PANEL
Cholesterol: 225 mg/dL — ABNORMAL HIGH (ref <200)
HDL: 41 mg/dL — ABNORMAL LOW (ref >=50)
LDL Cholesterol (Calc): 169 mg/dL (calc) — ABNORMAL HIGH
Non-HDL Cholesterol (Calc): 184 mg/dL (calc) — ABNORMAL HIGH (ref <130)
Total CHOL/HDL Ratio: 5.5 (calc) — ABNORMAL HIGH (ref <5.0)
Triglycerides: 60 mg/dL (ref <150)

## 2020-03-20 LAB — VITAMIN D 25 HYDROXY (VIT D DEFICIENCY, FRACTURES): Vit D, 25-Hydroxy: 21 ng/mL — ABNORMAL LOW (ref 30–100)

## 2020-03-22 ENCOUNTER — Other Ambulatory Visit: Payer: Self-pay

## 2020-03-22 ENCOUNTER — Encounter: Payer: Self-pay | Admitting: Internal Medicine

## 2020-03-22 ENCOUNTER — Ambulatory Visit: Payer: 59 | Admitting: Internal Medicine

## 2020-03-22 DIAGNOSIS — J302 Other seasonal allergic rhinitis: Secondary | ICD-10-CM

## 2020-03-22 DIAGNOSIS — R0602 Shortness of breath: Secondary | ICD-10-CM | POA: Diagnosis not present

## 2020-03-22 DIAGNOSIS — J45909 Unspecified asthma, uncomplicated: Secondary | ICD-10-CM | POA: Diagnosis not present

## 2020-03-22 NOTE — Progress Notes (Signed)
[facility] [place], [place] [address]  Pulmonary Sleep Medicine   Office Visit Note  Patient Name: Alisha Kim DOB: August 20, 1970 MRN [id number]  Date of Service: 03/26/2020  Complaints/HPI: Patient presents Kim as a new pulmonary patient She was referred by her GI doctor, Dr. Marius Ditch due to her asthma--Dr. Marius Ditch is following Alisha Kim for weight gain but feels as though her weight gain is likely related to excessive prednisone use for asthma exacerbations Alisha Kim says she was diagnosed with asthma in her late 97's, she has never been managed by a pulmonologist--typically sees her PCP or urgent care when having a flare of her asthma She says she routinely gets prescribed prednisone and lately has been on and off prednisone every other month for quite some time She is tried of not having her asthma and the weight gain that has been caused by prednisone use She has never been on a daily inhaler, denies personal history of smoking, does report that as a child both of her parents smoked within the home--exposure to second-hand smoking for about 10 years She reports she currently feels she is having an exacerbation, chest tightness, shortness of breath, cough, wheezing--she does feel her exacerbations occur with season changes, notices her symptoms are more frequent at this time   ROS  General: (-) fever, (-) chills, (-) night sweats, (-) weakness Skin: (-) rashes, (-) itching,. Eyes: (-) visual changes, (-) redness, (-) itching. Nose and Sinuses: (-) nasal stuffiness or itchiness, (-) postnasal drip, (-) nosebleeds, (-) sinus trouble. Mouth and Throat: (-) sore throat, (-) hoarseness. Neck: (-) swollen glands, (-) enlarged thyroid, (-) neck pain. Respiratory: + cough, (-) bloody sputum, + shortness of breath, + wheezing. Cardiovascular: - ankle swelling, (-) chest pain. Lymphatic: (-) lymph node enlargement. Neurologic: (-) numbness, (-)  tingling. Psychiatric: (-) anxiety, (-) depression   Current Medication: Outpatient Encounter Medications as of 03/22/2020  Medication Sig  . albuterol (PROAIR HFA) 108 (90 Base) MCG/ACT inhaler Inhale 1-2 puffs into the lungs every 4 (four) hours as needed for wheezing or shortness of breath.  . ALPRAZolam (XANAX) 0.25 MG tablet Take 1-2 tablets (0.25-0.5 mg total) by mouth 2 (two) times daily as needed for anxiety (panic attacks).  Marland Kitchen amLODipine (NORVASC) 5 MG tablet TAKE 1 TABLET (5 MG TOTAL) BY MOUTH EVERY EVENING.  Marland Kitchen escitalopram (LEXAPRO) 10 MG tablet Take 1 tablet (10 mg total) by mouth daily.  . fluticasone (FLOVENT HFA) 110 MCG/ACT inhaler Inhale 2 puffs into the lungs 2 (two) times a day. Rinse mouth out after 2nd puff; use every day  . hydrOXYzine (ATARAX/VISTARIL) 10 MG tablet Take 1-2 tablets (10-20 mg total) by mouth every 6 (six) hours as needed for itching.  Marland Kitchen PAZEO 0.7 % SOLN Place 2 drops into both eyes as needed.   No facility-administered encounter medications on file as of 03/22/2020.    Surgical History: Past Surgical History:  Procedure Laterality Date  . ABDOMINAL HYSTERECTOMY    . COLONOSCOPY WITH PROPOFOL N/A 05/17/2018   Procedure: COLONOSCOPY WITH PROPOFOL;  Surgeon: Lin Landsman, MD;  Location: [facility] ENDOSCOPY;  Service: Gastroenterology;  Laterality: N/A;  . DENTAL SURGERY    . NO PAST SURGERIES    . TONSILLECTOMY    . VAGINAL HYSTERECTOMY Bilateral 09/29/2016   Procedure: HYSTERECTOMY VAGINAL WITH BILATERAL SALPINGECTOMY;  Surgeon: [name], MD;  Location: [facility] ORS;  Service: Gynecology;  Laterality: Bilateral;    Medical History: Past Medical History:  Diagnosis Date  .  Adenomyosis 09/2015  . Allergy   . Anemia    history of  . Anxiety    controlled;   . Asthma   . Depression with anxiety   . Diabetes mellitus without complication ([place])    was told she was pre-diabetic  . Fibroids 08/14/2016   [place] March 2018  . History of Helicobacter  pylori infection 08/08/2016   2004  . 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 Hypertension    controlled with medication;   . Pre-diabetes     Family History: Family History  Problem Relation Age of Onset  . Alcohol abuse Mother   . Arthritis Mother   . Asthma Mother   . Depression Mother   . Drug abuse Mother   . Hypertension Mother   . Mental illness Mother   . Cancer Mother        breast and lung  . Diabetes Mother   . Breast cancer Mother 58  . Alcohol abuse Father   . Drug abuse Father   . Cancer Father        lung  . Hypertension Brother   . Hearing loss Maternal Aunt   . Hypertension Maternal Aunt   . Stroke Maternal Aunt   . Alzheimer's disease Maternal Aunt   . Breast cancer Maternal Aunt        mat great aunt  . Cancer Maternal Grandmother        cervical  . Heart disease Maternal Grandmother   . Hypertension Maternal Grandmother     Social History: Social History   Socioeconomic History  . Marital status: Married    Spouse name: Not on file  . Number of children: Not on file  . Years of education: Not on file  . Highest education level: Not on file  Occupational History  . Not on file  Tobacco Use  . Smoking status: Never Smoker  . Smokeless tobacco: Never Used  Vaping Use  . Vaping Use: Never used  Substance and Sexual Activity  . Alcohol use: Not Currently    Alcohol/week: 0.0 standard drinks  . Drug use: No  . Sexual activity: Yes    Partners: Male    Birth control/protection: None, Surgical  Other Topics Concern  . Not on file  Social History Narrative  . Not on file   Social Determinants of Health   Financial Resource Strain: Low Risk   . Difficulty of Paying Living Expenses: Not hard at all  Food Insecurity: No Food Insecurity  . Worried About Charity fundraiser in the Last Year: Never true  . Ran Out of Food in the Last Year: Never true  Transportation Needs: No Transportation Needs  . Lack of Transportation (Medical): No  . Lack  of Transportation (Non-Medical): No  Physical Activity:   . Days of Exercise per Week: Not on file  . Minutes of Exercise per Session: Not on file  Stress: Stress Concern Present  . Feeling of Stress : To some extent  Social Connections: Moderately Isolated  . Frequency of Communication with Friends and Family: More than three times a week  . Frequency of Social Gatherings with Friends and Family: More than three times a week  . Attends Religious Services: Never  . Active Member of Clubs or Organizations: No  . Attends Archivist Meetings: Never  . Marital Status: Married  Human resources officer Violence: Not At Risk  . Fear of Current or Ex-Partner: No  . Emotionally Abused: No  .  Physically Abused: No  . Sexually Abused: No    Vital Signs: Blood pressure 118/74, pulse 82, resp. rate 16, height 5\' 6"  (1.676 m), weight 255 lb 3.2 oz (115.8 kg), last menstrual period 09/29/2016, SpO2 98 %.  Examination: General Appearance: The patient is well-developed, well-nourished, and in no distress. Skin: Gross inspection of skin unremarkable. Head: normocephalic, no gross deformities. Eyes: no gross deformities noted. ENT: ears appear grossly normal no exudates. Neck: Supple. No thyromegaly. No LAD. Respiratory: Prominent wheezing in bilateral bases, no rhonchi or rales noted. Cardiovascular: Normal S1 and S2 without murmur or rub. Extremities: No cyanosis. pulses are equal. Neurologic: Alert and oriented. No involuntary movements.  LABS: Recent Results (from the past 2160 hour(s))  Hepatic function panel     Status: Abnormal   Collection Time: 03/09/20  4:16 PM  Result Value Ref Range   Total Protein 6.9 6.0 - 8.5 g/dL   Albumin 3.7 (L) 3.8 - 4.8 g/dL   Bilirubin Total <0.2 0.0 - 1.2 mg/dL   Bilirubin, Direct <0.10 0.00 - 0.40 mg/dL   Alkaline Phosphatase 142 (H) 44 - 121 IU/L    Comment:               **Please note reference interval change**   AST 19 0 - 40 IU/L   ALT 25 0  - 32 IU/L  HCV FibroSURE     Status: Abnormal   Collection Time: 03/14/20 11:12 AM  Result Value Ref Range   Fibrosis Score 0.02 0.00 - 0.21   Fibrosis Stage Comment     Comment:                    F0 - No fibrosis   Necroinflammat Activity Score 0.13 0.00 - 0.17   Necroinflammat Activity Grade A0-No activity    ALPHA 2-MACROGLOBULINS, QN 96 (L) 110 - 276 mg/dL   Haptoglobin 138 42 - 296 mg/dL   Apolipoprotein A-1 108 (L) 116 - 209 mg/dL   Bilirubin, Total 0.2 0.0 - 1.2 mg/dL   GGT 11 0 - 60 IU/L   ALT (SGPT) P5P 35 0 - 40 IU/L   Interpretations: Comment     Comment: Quantitative results of 6 biochemical tests are analyzed using a computational algorithm to provide a quantitative surrogate marker (0.0-1.0) for liver fibrosis (METAVIR F0-F4) and for necroinflammatory activity (METAVIR A0-A3).    Fibrosis Scoring: Comment     Comment:      <=0.21 = Stage F0 - No fibrosis 0.21 - 0.27 = Stage F0 - F1 0.27 - 0.31 = Stage F1 - Portal fibrosis 0.31 - 0.48 = Stage F1 - F2 0.48 - 0.58 = Stage F2 - Bridging fibrosis with few septa 0.58 - 0.72 = Stage F3 - Bridging fibrosis with many septa 0.72 - 0.74 = Stage F3 - F4       >0.74 = Stage F4 - Cirrhosis    Necroinflamm Activity Scoring: Comment     Comment:       <0.17 = Grade A0 - No Activity 0.17 - 0.29 = Grade A0 - A1 0.29 - 0.36 = Grade A1 - Minimal activity 0.36 - 0.52 = Grade A1 - A2 0.52 - 0.60 = Grade A2 - Moderate activity 0.60 - 0.62 = Grade A2 - A3       >0.62 = Grade A3 - Severe activity    Limitations: Comment     Comment: The negative predictive value of a Fibrotest score <0.31 (absence of  clinically significant fibrosis) was 85% when compared to liver biopsy in 1,270 HCV infected patients with a 38% prevalence of significant liver fibrosis (F2, 3 or 4). The positive predictive value of a Fibro- test score >0.48 (F2, 3, 4) was 61% in that same patient cohort. HCV FibroSURE is not recommended in patients with Rosanna Randy  Disease, acute hemolysis (e.g. HCV ribavirin therapy mediated hemolysis) acute hepa- titis of the liver, extra-hepatic cholestasis, transplant patients, and/or renal insufficiency patients.  Any of these clinical situations may lead to inaccurate quantitative predictions of fibrosis and necroinflammatory activity in the liver.    Comment Comment     Comment: This test was developed and its performance characteristics determined by [facility].  It has not been cleared or approved by the Food and Drug Administration.  The FDA has determined that such clearance or approval is not necessary. For questions regarding this report please contact customer service at [phone].   COMPLETE METABOLIC PANEL WITH GFR     Status: Abnormal   Collection Time: 03/19/20  2:13 PM  Result Value Ref Range   Glucose, Bld 104 65 - 139 mg/dL    Comment: .        Non-fasting reference interval .    BUN 11 7 - 25 mg/dL   Creat 0.73 0.50 - 1.10 mg/dL   GFR, Est Non African American 97 > OR = 60 mL/min/1.50m2   GFR, Est African American 113 > OR = 60 mL/min/1.29m2   BUN/Creatinine Ratio NOT APPLICABLE 6 - 22 (calc)   Sodium 138 135 - 146 mmol/L   Potassium 4.5 3.5 - 5.3 mmol/L   Chloride 103 98 - 110 mmol/L   CO2 31 20 - 32 mmol/L   Calcium 9.6 8.6 - 10.2 mg/dL   Total Protein 7.7 6.1 - 8.1 g/dL   Albumin 4.1 3.6 - 5.1 g/dL   Globulin 3.6 1.9 - 3.7 g/dL (calc)   AG Ratio 1.1 1.0 - 2.5 (calc)   Total Bilirubin 0.3 0.2 - 1.2 mg/dL   Alkaline phosphatase (APISO) 124 31 - 125 U/L   AST 24 10 - 35 U/L   ALT 35 (H) 6 - 29 U/L  CBC with Differential/Platelet     Status: Abnormal   Collection Time: 03/19/20  2:13 PM  Result Value Ref Range   WBC 4.6 3.8 - 10.8 Thousand/uL   RBC 5.16 (H) 3.80 - 5.10 Million/uL   Hemoglobin 13.6 11.7 - 15.5 g/dL   HCT 42.0 35 - 45 %   MCV 81.4 80.0 - 100.0 fL   MCH 26.4 (L) 27.0 - 33.0 pg   MCHC 32.4 32.0 - 36.0 g/dL   RDW 13.9 11.0 - 15.0 %   Platelets 269 140 - 400  Thousand/uL   MPV 10.9 7.5 - 12.5 fL   Neutro Abs 2,461 1,500 - 7,800 cells/uL   Lymphs Abs 1,693 850 - 3,900 cells/uL   Absolute Monocytes 359 200 - 950 cells/uL   Eosinophils Absolute 69 15.0 - 500.0 cells/uL   Basophils Absolute 18 0.0 - 200.0 cells/uL   Neutrophils Relative % 53.5 %   Total Lymphocyte 36.8 %   Monocytes Relative 7.8 %   Eosinophils Relative 1.5 %   Basophils Relative 0.4 %  Hemoglobin A1c     Status: Abnormal   Collection Time: 03/19/20  2:13 PM  Result Value Ref Range   Hgb A1c MFr Bld 6.3 (H) <5.7 % of total Hgb    Comment: For someone without known diabetes, a  hemoglobin  A1c value between 5.7% and 6.4% is consistent with prediabetes and should be confirmed with a  follow-up test. . For someone with known diabetes, a value <7% indicates that their diabetes is well controlled. A1c targets should be individualized based on duration of diabetes, age, comorbid conditions, and other considerations. . This assay result is consistent with an increased risk of diabetes. . Currently, no consensus exists regarding use of hemoglobin A1c for diagnosis of diabetes for children. .    Mean Plasma Glucose 134 (calc)   eAG (mmol/L) 7.4 (calc)  VITAMIN D 25 Hydroxy (Vit-D Deficiency, Fractures)     Status: Abnormal   Collection Time: 03/19/20  2:13 PM  Result Value Ref Range   Vit D, 25-Hydroxy 21 (L) 30 - 100 ng/mL    Comment: Vitamin D Status         25-OH Vitamin D: . Deficiency:                    <20 ng/mL Insufficiency:             20 - 29 ng/mL Optimal:                 > or = 30 ng/mL . For 25-OH Vitamin D testing on patients on  D2-supplementation and patients for whom quantitation  of D2 and D3 fractions is required, the QuestAssureD(TM) 25-OH VIT D, (D2,D3), LC/MS/MS is recommended: order  code [phone] (patients >39yrs). See Note 1 . Note 1 . For additional information, please refer to  http://education.[facility].com/faq/FAQ199  (This link  is being provided for informational/ educational purposes only.)   Lipid panel     Status: Abnormal   Collection Time: 03/19/20  2:13 PM  Result Value Ref Range   Cholesterol 225 (H) <200 mg/dL   HDL 41 (L) > OR = 50 mg/dL   Triglycerides 60 <150 mg/dL   LDL Cholesterol (Calc) 169 (H) mg/dL (calc)    Comment: Reference range: <100 . Desirable range <100 mg/dL for primary prevention;   <70 mg/dL for patients with CHD or diabetic patients  with > or = 2 CHD risk factors. Marland Kitchen LDL-C is now calculated using the Martin-Hopkins  calculation, which is a validated novel method providing  better accuracy than the Friedewald equation in the  estimation of LDL-C.  Cresenciano Genre et al. Annamaria Helling. 3790;240(97): 2061-2068  (http://education.[facility].com/faq/FAQ164)    Total CHOL/HDL Ratio 5.5 (H) <5.0 (calc)   Non-HDL Cholesterol (Calc) 184 (H) <130 mg/dL (calc)    Comment: For patients with diabetes plus 1 major ASCVD risk  factor, treating to a non-HDL-C goal of <100 mg/dL  (LDL-C of <70 mg/dL) is considered a therapeutic  option.     Radiology: No results found.  No results found.  No results found.    Assessment and Plan: Patient Active Problem List   Diagnosis Date Noted  . Current moderate episode of major depressive disorder ([place]) 02/17/2020  . Grief reaction 02/17/2020  . Stress reaction 02/17/2020  . Morbid obesity ([place]) 05/31/2018  . Colon cancer screening   . Allergic rhinitis 03/01/2018  . Cervical stenosis of spinal canal 08/28/2017  . S/P vaginal hysterectomy 09/29/2016  . History of Helicobacter pylori infection 08/08/2016  . Microcytosis 08/01/2016  . Heel spur, left 04/07/2016  . Bilateral foot pain 04/04/2016  . Anxiety 04/04/2016  . Hypertension, goal below 140/90 07/24/2015  . Bilateral hip pain 11/17/2014  . Atopic dermatitis 11/17/2014  . Low serum vitamin D 11/17/2014  .  Pre-diabetes 11/17/2014  . Depression with anxiety 11/17/2014  . Mild  persistent asthma with allergic rhinitis without complication 19/50/9326      General Counseling: I have discussed the findings of the evaluation and examination with Alisha Kim.  I have also discussed any further diagnostic evaluation thatmay be needed or ordered Kim. Alisha Kim verbalizes understanding of the findings of todays visit. Alisha Kim and discussed drug interactions and side effects including but not limited excessive drowsiness and altered mental states. Alisha also discussed that there is always a risk not just to her but also people around her. she has been encouraged to call the office with any questions or concerns that should arise related to todays visit.  1. Persistent asthma without complication, unspecified asthma severity Will obtain PFT for proper diagnosis and to help with management--for acute exacerbation nebulizer machine given in office Kim with duo-nebs--advised to be used at least once daily while having symptoms Samples of Breztri given in office Kim--will assess response to therapy at next visit - Pulmonary function test; Future - For home use only DME Nebulizer machine  2. Shortness of breath  Use nebulizer given in office Kim with duo-neb medicine at least daily to help with shortness of breath and chest tightness - For home use only DME Nebulizer machine  3. Seasonal allergies Will perform allergy testing--potential allergy induced asthma due to symptom presentation, will review results and adjust plan of care as needed - Allergy Test  Orders Placed This Encounter  Procedures  . Allergy Test    Order Specific Question:   Allergy test to perform    Answer:   per [facility]SK  . For home use only DME Nebulizer machine    Order Specific Question:   Patient needs a nebulizer to treat with the following condition    Answer:   Asthma [745110]    Order Specific Question:   Length of Need    Answer:   Lifetime  . Pulmonary function test     Standing Status:   Future    Standing Expiration Date:   03/22/2021    Order Specific Question:   Where should this test be performed?    Answer:   [facility]     Time spent: 40  I have personally obtained a history, examined the patient, evaluated laboratory and imaging results, formulated the assessment and plan and placed orders. This patient was seen by Casey Burkitt AGNP-C in Collaboration with Dr. Devona Konig as a part of collaborative care agreement.    Allyne Gee, MD [facility] Pulmonary and Critical Care Sleep medicine

## 2020-03-26 ENCOUNTER — Encounter: Payer: Self-pay | Admitting: Internal Medicine

## 2020-03-26 NOTE — Patient Instructions (Signed)
Asthma, Adult  Asthma is a long-term (chronic) condition in which the airways get tight and narrow. The airways are the breathing passages that lead from the nose and mouth down into the lungs. A person with asthma will have times when symptoms get worse. These are called asthma attacks. They can cause coughing, whistling sounds when you breathe (wheezing), shortness of breath, and chest pain. They can make it hard to breathe. There is no cure for asthma, but medicines and lifestyle changes can help control it. There are many things that can bring on an asthma attack or make asthma symptoms worse (triggers). Common triggers include:  Mold.  Dust.  Cigarette smoke.  Cockroaches.  Things that can cause allergy symptoms (allergens). These include animal skin flakes (dander) and pollen from trees or grass.  Things that pollute the air. These may include household cleaners, wood smoke, smog, or chemical odors.  Cold air, weather changes, and wind.  Crying or laughing hard.  Stress.  Certain medicines or drugs.  Certain foods such as dried fruit, potato chips, and grape juice.  Infections, such as a cold or the flu.  Certain medical conditions or diseases.  Exercise or tiring activities. Asthma may be treated with medicines and by staying away from the things that cause asthma attacks. Types of medicines may include:  Controller medicines. These help prevent asthma symptoms. They are usually taken every day.  Fast-acting reliever or rescue medicines. These quickly relieve asthma symptoms. They are used as needed and provide short-term relief.  Allergy medicines if your attacks are brought on by allergens.  Medicines to help control the body's defense (immune) system. Follow these instructions at home: Avoiding triggers in your home  Change your heating and air conditioning filter often.  Limit your use of fireplaces and wood stoves.  Get rid of pests (such as roaches and  mice) and their droppings.  Throw away plants if you see mold on them.  Clean your floors. Dust regularly. Use cleaning products that do not smell.  Have someone vacuum when you are not home. Use a vacuum cleaner with a HEPA filter if possible.  Replace carpet with wood, tile, or vinyl flooring. Carpet can trap animal skin flakes and dust.  Use allergy-proof pillows, mattress covers, and box spring covers.  Wash bed sheets and blankets every week in hot water. Dry them in a dryer.  Keep your bedroom free of any triggers.  Avoid pets and keep windows closed when things that cause allergy symptoms are in the air.  Use blankets that are made of polyester or cotton.  Clean bathrooms and kitchens with bleach. If possible, have someone repaint the walls in these rooms with mold-resistant paint. Keep out of the rooms that are being cleaned and painted.  Wash your hands often with soap and water. If soap and water are not available, use hand sanitizer.  Do not allow anyone to smoke in your home. General instructions  Take over-the-counter and prescription medicines only as told by your doctor. ? Talk with your doctor if you have questions about how or when to take your medicines. ? Make note if you need to use your medicines more often than usual.  Do not use any products that contain nicotine or tobacco, such as cigarettes and e-cigarettes. If you need help quitting, ask your doctor.  Stay away from secondhand smoke.  Avoid doing things outdoors when allergen counts are high and when air quality is low.  Wear a ski mask   when doing outdoor activities in the winter. The mask should cover your nose and mouth. Exercise indoors on cold days if you can.  Warm up before you exercise. Take time to cool down after exercise.  Use a peak flow meter as told by your doctor. A peak flow meter is a tool that measures how well the lungs are working.  Keep track of the peak flow meter's readings.  Write them down.  Follow your asthma action plan. This is a written plan for taking care of your asthma and treating your attacks.  Make sure you get all the shots (vaccines) that your doctor recommends. Ask your doctor about a flu shot and a pneumonia shot.  Keep all follow-up visits as told by your doctor. This is important. Contact a doctor if:  You have wheezing, shortness of breath, or a cough even while taking medicine to prevent attacks.  The mucus you cough up (sputum) is thicker than usual.  The mucus you cough up changes from clear or white to yellow, green, gray, or bloody.  You have problems from the medicine you are taking, such as: ? A rash. ? Itching. ? Swelling. ? Trouble breathing.  You need reliever medicines more than 2-3 times a week.  Your peak flow reading is still at 50-79% of your personal best after following the action plan for 1 hour.  You have a fever. Get help right away if:  You seem to be worse and are not responding to medicine during an asthma attack.  You are short of breath even at rest.  You get short of breath when doing very little activity.  You have trouble eating, drinking, or talking.  You have chest pain or tightness.  You have a fast heartbeat.  Your lips or fingernails start to turn blue.  You are light-headed or dizzy, or you faint.  Your peak flow is less than 50% of your personal best.  You feel too tired to breathe normally. Summary  Asthma is a long-term (chronic) condition in which the airways get tight and narrow. An asthma attack can make it hard to breathe.  Asthma cannot be cured, but medicines and lifestyle changes can help control it.  Make sure you understand how to avoid triggers and how and when to use your medicines. This information is not intended to replace advice given to you by your health care provider. Make sure you discuss any questions you have with your health care provider. Document Revised:  07/29/2018 Document Reviewed: 06/30/2016 Elsevier Patient Education  2020 [employer]  

## 2020-04-09 ENCOUNTER — Ambulatory Visit: Payer: 59 | Admitting: Internal Medicine

## 2020-04-09 ENCOUNTER — Encounter: Payer: Self-pay | Admitting: Internal Medicine

## 2020-04-09 ENCOUNTER — Other Ambulatory Visit: Payer: Self-pay

## 2020-04-09 VITALS — BP 120/80 | HR 88 | Temp 97.8°F | Resp 16 | Ht 66.0 in | Wt 255.8 lb

## 2020-04-09 DIAGNOSIS — J301 Allergic rhinitis due to pollen: Secondary | ICD-10-CM

## 2020-04-09 NOTE — Procedures (Signed)
    OMNI Allergy MQT Recording Form  [facility] [address] [place], [place] [address] Phone [phone] Fax [phone]   Patient Name: Alisha Kim Age: 49 y.o. Sex: female Date of Service: 04/09/2020   Performing Provider: Allyne Gee MD [facility]         Battery A Back   Site Antigen [facility] Flare  A1 Positive Control 10 39  A2 Negative Control 5 5  A3 American Elm 6 12  A4 Maple Box Elder 7 10  A5 Grass Mix 5 10  A6 Dock Sorrel Mix 0 0  A7 Russian Thistle 7 12  A8 Ragweed Mix 10 10  A9 English Pantain 0 0  A10 Oak Mix  7 11   Battery B Wheal Flare  B1 Lambs Quarters 7 11  B2 Cottonwood 0 0  B3 Pigweed Mix 0 0  B4 Acacia 0 0  B5 Pine Mix 0 0  B6 Privet 7 11  B7 White/Red Mulberry 0 0  B8 Western Water Hemp 0 0  B9 Guatemala Grass 0 0  B10 Melalucea 7 11   Battery C Wheal Flare  C1 Red River Birch 10 19  C2 Eastern Sycamore 0 0  C3 Bahai Grass 0 0  C4 American Beech 0 0  C5 Ash Mix 4 14  C6 Black Willow 0 0  C7 Hickory 0 0  C8 Black Walnut 0 0  C9 Red Cedar 0 0  C10 Sweet Gum  7 10   Battery D Wheal Flare  D1 Cultivated Oat 7 9  D2 Dog Fennel 0 0  D3 Common Mugwort 7 11  D4 Marsh Elder 0 0  D5 Johnson 0 0  D6 Hackberry Tree 7 10  D7 Bayberry Tree 0 0  D8 Cypress, Bald Tree 0 0  D9 Aspergillus Fumigatus 0 0  D10 Alternia  0 0   Battery E Wheal Flare  E1 Dreschlere 7 11  E2 Fusarium Mix 7 10  E3 Cladosporum Sph 0 0  E4 Bipolaris 0 0  E5 Penicillin chrys 0 0  E6 Cladosporum Herb 0 0  E7 Candida 0 0  E8 Aureobasidium 0 0  E9 Rhizopus 0 0  E10 Botrytis  0 0   Battery F Wheal Flare  F1 Aspergillus [place] 0 0  F2 Dust Mite Mix 10 25  F3 Cockroach Mix 10 22  F4 Cat Hair 0 0  F5 Dog Mixed breeds 0 0  F6 Feather Mix 0 0

## 2020-04-10 NOTE — Progress Notes (Signed)
Allergy test done.

## 2020-04-10 NOTE — Patient Instructions (Signed)
Allergy Shots, Adult Allergy shots, also known as allergen immunotherapy, is a treatment that helps reduce allergy symptoms or conditions such as:  Sneezing.  Itchy, watery eyes.  A runny, stuffy nose.  Asthma. The treatment may benefit people who are allergic to any of these substances:  Pollen from grasses, trees, plants, and weeds.  Insect venom.  Animal dander.  House dust mites.  Molds. The treatment is not done for food allergies. During your allergy immunotherapy treatments, the substance that you are allergic to (allergen) will be injected under your skin.  At first, only a little bit of the substance will be given.  Over time, the amount will slowly be increased.  The treatments allow your body to build up a tolerance (immunity) to the allergen and create proteins called antibodies that block the effect that the allergen has on your body. Most people start by getting shots 1-3 times a week for 3-6 months, then they continue to get maintenance shots about one time per month for life. Some people can stop getting shots after 3-5 years. It may be necessary to stop this treatment if:  The shots do not work for you.  You start taking certain medicines that interact with the shots.  You miss many appointments for your shots. Allergy tablets given under the tongue is another treatment option that may work for certain types of allergies. Tell a health care provider about:  All medicines you are taking, including vitamins, herbs, eye drops, creams, and over-the-counter medicines. If you are taking certain types of heart medicine (beta blockers), you may not be able to receive allergy shots.  Any blood disorders you have.  Any medical conditions you have, especially asthma.  Whether you are pregnant or may be pregnant. What are the risks? This treatment can cause side effects. The most common side effects affect the injection area and include mild redness and swelling.  They often go away on their own. Serious reactions, while rare, can happen. A serious reaction that spreads throughout the body (systemic reaction) may require immediate treatment by a health care provider. Reactions usually happen within the first 30 minutes after the shots (injections), but they can occur later. What happens before the procedure?  Let your health care provider know if you are not feeling well the day of the injection. What happens during the procedure?  The allergen will be injected into your skin. The procedure may vary among health care providers and hospitals. What happens after the procedure?   You will need to stay at the clinic for up to 30 minutes so a health care provider can be sure that you do not have serious side effects.  The shots will begin to work shortly after you begin treatment, but your allergy symptoms may not improve for 3-12 months.  Get help right away if you have any symptoms of a systemic reaction. These include: ? Itchy, red, swollen areas of skin (hives) or rash. ? Itchy eyes, nose, or throat. ? Runny nose or nasal congestion. ? Coughing or trouble breathing. ? Wheezing. ? Chest tightness. ? Swelling of the throat. ? Nausea. ? Dizziness.  Keep all follow-up visits as told by your health care provider. This is important. Summary  Allergen immunotherapy is a treatment that helps reduce allergy symptoms.  Before receiving injections, tell a health care provider about all medicines you are taking.  The most common side effects of allergy shots are mild redness and swelling at the injection site.    There is a risk of a serious allergic reaction. Seek medical care right away if you develop wheezing, chest tightness, or any trouble with breathing. This information is not intended to replace advice given to you by your health care provider. Make sure you discuss any questions you have with your health care provider. Document Revised:  05/08/2017 Document Reviewed: 04/14/2016 Elsevier Patient Education  2020 [employer].

## 2020-04-18 ENCOUNTER — Ambulatory Visit: Payer: 59 | Admitting: Internal Medicine

## 2020-04-18 ENCOUNTER — Other Ambulatory Visit: Payer: Self-pay

## 2020-04-18 DIAGNOSIS — J45909 Unspecified asthma, uncomplicated: Secondary | ICD-10-CM | POA: Diagnosis not present

## 2020-04-18 LAB — PULMONARY FUNCTION TEST

## 2020-04-19 ENCOUNTER — Telehealth: Payer: Self-pay

## 2020-04-19 NOTE — Telephone Encounter (Signed)
Patient advised of allergy benefits and will call back once she decides, gave benefit information to desha to hold onto with allergy results. Alisha Kim

## 2020-04-23 ENCOUNTER — Ambulatory Visit: Payer: 59 | Admitting: Internal Medicine

## 2020-04-27 NOTE — Procedures (Signed)
[address] [place], [address]  DATE OF SERVICE: April 18, 2020  Complete Pulmonary Function Testing Interpretation:  FINDINGS:  Patient forced vital capacity is normal.  FEV1 is normal.  FEV1 FVC ratio is is normal.  Postbronchodilator there is no significant change in FEV1.  Total lung capacity is normal residual volume is mildly decreased.  Residual internal capacity ratio is decreased.  FRC is decreased.  DLCO is normal  IMPRESSION:  This pulmonary function study is within normal limits  Allyne Gee, MD [facility] Pulmonary Critical Care Medicine Sleep Medicine

## 2020-04-30 ENCOUNTER — Ambulatory Visit: Payer: 59 | Admitting: Hospice and Palliative Medicine

## 2020-05-01 ENCOUNTER — Encounter: Payer: Self-pay | Admitting: Internal Medicine

## 2020-05-01 ENCOUNTER — Other Ambulatory Visit: Payer: Self-pay

## 2020-05-01 ENCOUNTER — Ambulatory Visit: Payer: 59 | Admitting: Internal Medicine

## 2020-05-01 VITALS — BP 130/80 | HR 89 | Resp 16 | Ht 66.0 in | Wt 254.0 lb

## 2020-05-01 DIAGNOSIS — J301 Allergic rhinitis due to pollen: Secondary | ICD-10-CM

## 2020-05-01 DIAGNOSIS — J45909 Unspecified asthma, uncomplicated: Secondary | ICD-10-CM | POA: Diagnosis not present

## 2020-05-01 NOTE — Progress Notes (Signed)
[facility] [place], [place] [address]  Pulmonary Sleep Medicine   Office Visit Note  Patient Name: Alisha Kim DOB: 1970/10/18 MRN [id number]  Date of Service: 05/01/2020  Complaints/HPI: Patient being seen today for routine pulmonary follow-up Second follow-up on asthma--was given sample of Breztri at last visit and has noticed a significant improvement in her symptoms--shortness of breath and wheezing has improved PFT 11/10--normal findings Underwent allergy testing--no significant findings, not interested in allergy injections at this time, was told she was allergic to roaches--annoyed by this because she does not feel she has roaches in her home and does not feel her asthma attacks are triggered by this  ROS  General: (-) fever, (-) chills, (-) night sweats, (-) weakness Skin: (-) rashes, (-) itching,. Eyes: (-) visual changes, (-) redness, (-) itching. Nose and Sinuses: (-) nasal stuffiness or itchiness, (-) postnasal drip, (-) nosebleeds, (-) sinus trouble. Mouth and Throat: (-) sore throat, (-) hoarseness. Neck: (-) swollen glands, (-) enlarged thyroid, (-) neck pain. Respiratory: - cough, (-) bloody sputum, - shortness of breath, - wheezing. Cardiovascular: - ankle swelling, (-) chest pain. Lymphatic: (-) lymph node enlargement. Neurologic: (-) numbness, (-) tingling. Psychiatric: (-) anxiety, (-) depression   Current Medication: Outpatient Encounter Medications as of 05/01/2020  Medication Sig   albuterol (PROAIR HFA) 108 (90 Base) MCG/ACT inhaler Inhale 1-2 puffs into the lungs every 4 (four) hours as needed for wheezing or shortness of breath.   ALPRAZolam (XANAX) 0.25 MG tablet Take 1-2 tablets (0.25-0.5 mg total) by mouth 2 (two) times daily as needed for anxiety (panic attacks).   amLODipine (NORVASC) 5 MG tablet TAKE 1 TABLET (5 MG TOTAL) BY MOUTH EVERY EVENING.   escitalopram (LEXAPRO) 10 MG tablet Take 1 tablet (10 mg total)  by mouth daily.   fluticasone (FLOVENT HFA) 110 MCG/ACT inhaler Inhale 2 puffs into the lungs 2 (two) times a day. Rinse mouth out after 2nd puff; use every day   hydrOXYzine (ATARAX/VISTARIL) 10 MG tablet Take 1-2 tablets (10-20 mg total) by mouth every 6 (six) hours as needed for itching.   PAZEO 0.7 % SOLN Place 2 drops into both eyes as needed.   No facility-administered encounter medications on file as of 05/01/2020.    Surgical History: Past Surgical History:  Procedure Laterality Date   ABDOMINAL HYSTERECTOMY     COLONOSCOPY WITH PROPOFOL N/A 05/17/2018   Procedure: COLONOSCOPY WITH PROPOFOL;  Surgeon: Lin Landsman, MD;  Location: [facility] ENDOSCOPY;  Service: Gastroenterology;  Laterality: N/A;   DENTAL SURGERY     NO PAST SURGERIES     TONSILLECTOMY     VAGINAL HYSTERECTOMY Bilateral 09/29/2016   Procedure: HYSTERECTOMY VAGINAL WITH BILATERAL SALPINGECTOMY;  Surgeon: [name], MD;  Location: [facility] ORS;  Service: Gynecology;  Laterality: Bilateral;    Medical History: Past Medical History:  Diagnosis Date   Adenomyosis 09/2015   Allergy    Anemia    history of   Anxiety    controlled;    Asthma    Depression with anxiety    Diabetes mellitus without complication [facility])    was told she was pre-diabetic   Fibroids 08/14/2016   [place] March 3235   History of Helicobacter pylori infection 08/08/2016   2004   Hydrosalpinx 08/14/2016   right   Hypertension    controlled with medication;    Pre-diabetes     Family History: Family History  Problem Relation Age of Onset   Alcohol abuse Mother  Arthritis Mother    Asthma Mother    Depression Mother    Drug abuse Mother    Hypertension Mother    Mental illness Mother    Cancer Mother        breast and lung   Diabetes Mother    Breast cancer Mother 74   Alcohol abuse Father    Drug abuse Father    Cancer Father        lung   Hypertension Brother    Hearing loss Maternal Aunt     Hypertension Maternal Aunt    Stroke Maternal Aunt    Alzheimer's disease Maternal Aunt    Breast cancer Maternal Aunt        mat great aunt   Cancer Maternal Grandmother        cervical   Heart disease Maternal Grandmother    Hypertension Maternal Grandmother     Social History: Social History   Socioeconomic History   Marital status: Married    Spouse name: Not on file   Number of children: Not on file   Years of education: Not on file   Highest education level: Not on file  Occupational History   Not on file  Tobacco Use   Smoking status: Never Smoker   Smokeless tobacco: Never Used  Vaping Use   Vaping Use: Never used  Substance and Sexual Activity   Alcohol use: Not Currently    Alcohol/week: 0.0 standard drinks   Drug use: No   Sexual activity: Yes    Partners: Male    Birth control/protection: None, Surgical  Other Topics Concern   Not on file  Social History Narrative   Not on file   Social Determinants of Health   Financial Resource Strain: Low Risk    Difficulty of Paying Living Expenses: Not hard at all  Food Insecurity: No Food Insecurity   Worried About Charity fundraiser in the Last Year: Never true   Gunbarrel in the Last Year: Never true  Transportation Needs: No Transportation Needs   Lack of Transportation (Medical): No   Lack of Transportation (Non-Medical): No  Physical Activity:    Days of Exercise per Week: Not on file   Minutes of Exercise per Session: Not on file  Stress: Stress Concern Present   Feeling of Stress : To some extent  Social Connections: Moderately Isolated   Frequency of Communication with Friends and Family: More than three times a week   Frequency of Social Gatherings with Friends and Family: More than three times a week   Attends Religious Services: Never   Marine scientist or Organizations: No   Attends Music therapist: Never   Marital Status: Married   Human resources officer Violence: Not At Risk   Fear of Current or Ex-Partner: No   Emotionally Abused: No   Physically Abused: No   Sexually Abused: No    Vital Signs: Blood pressure 130/80, pulse 89, resp. rate 16, height 5\' 6"  (1.676 m), weight 254 lb (115.2 kg), last menstrual period 09/29/2016, SpO2 99 %.  Examination: General Appearance: The patient is well-developed, well-nourished, and in no distress. Skin: Gross inspection of skin unremarkable. Head: normocephalic, no gross deformities. Eyes: no gross deformities noted. ENT: ears appear grossly normal no exudates. Neck: Supple. No thyromegaly. No LAD. Respiratory: Clear throughout, no rhonchi, rale or wheezing noted. Cardiovascular: Normal S1 and S2 without murmur or rub. Extremities: No cyanosis. pulses are equal. Neurologic: Alert and  oriented. No involuntary movements.  LABS: Recent Results (from the past 2160 hour(s))  Hepatic function panel     Status: Abnormal   Collection Time: 03/09/20  4:16 PM  Result Value Ref Range   Total Protein 6.9 6.0 - 8.5 g/dL   Albumin 3.7 (L) 3.8 - 4.8 g/dL   Bilirubin Total <0.2 0.0 - 1.2 mg/dL   Bilirubin, Direct <0.10 0.00 - 0.40 mg/dL   Alkaline Phosphatase 142 (H) 44 - 121 IU/L    Comment:               **Please note reference interval change**   AST 19 0 - 40 IU/L   ALT 25 0 - 32 IU/L  HCV FibroSURE     Status: Abnormal   Collection Time: 03/14/20 11:12 AM  Result Value Ref Range   Fibrosis Score 0.02 0.00 - 0.21   Fibrosis Stage Comment     Comment:                    F0 - No fibrosis   Necroinflammat Activity Score 0.13 0.00 - 0.17   Necroinflammat Activity Grade A0-No activity    ALPHA 2-MACROGLOBULINS, QN 96 (L) 110 - 276 mg/dL   Haptoglobin 138 42 - 296 mg/dL   Apolipoprotein A-1 108 (L) 116 - 209 mg/dL   Bilirubin, Total 0.2 0.0 - 1.2 mg/dL   GGT 11 0 - 60 IU/L   ALT (SGPT) P5P 35 0 - 40 IU/L   Interpretations: Comment     Comment: Quantitative results of 6  biochemical tests are analyzed using a computational algorithm to provide a quantitative surrogate marker (0.0-1.0) for liver fibrosis (METAVIR F0-F4) and for necroinflammatory activity (METAVIR A0-A3).    Fibrosis Scoring: Comment     Comment:      <=0.21 = Stage F0 - No fibrosis 0.21 - 0.27 = Stage F0 - F1 0.27 - 0.31 = Stage F1 - Portal fibrosis 0.31 - 0.48 = Stage F1 - F2 0.48 - 0.58 = Stage F2 - Bridging fibrosis with few septa 0.58 - 0.72 = Stage F3 - Bridging fibrosis with many septa 0.72 - 0.74 = Stage F3 - F4       >0.74 = Stage F4 - Cirrhosis    Necroinflamm Activity Scoring: Comment     Comment:       <0.17 = Grade A0 - No Activity 0.17 - 0.29 = Grade A0 - A1 0.29 - 0.36 = Grade A1 - Minimal activity 0.36 - 0.52 = Grade A1 - A2 0.52 - 0.60 = Grade A2 - Moderate activity 0.60 - 0.62 = Grade A2 - A3       >0.62 = Grade A3 - Severe activity    Limitations: Comment     Comment: The negative predictive value of a Fibrotest score <0.31 (absence of clinically significant fibrosis) was 85% when compared to liver biopsy in 1,270 HCV infected patients with a 38% prevalence of significant liver fibrosis (F2, 3 or 4). The positive predictive value of a Fibro- test score >0.48 (F2, 3, 4) was 61% in that same patient cohort. HCV FibroSURE is not recommended in patients with Rosanna Randy Disease, acute hemolysis (e.g. HCV ribavirin therapy mediated hemolysis) acute hepa- titis of the liver, extra-hepatic cholestasis, transplant patients, and/or renal insufficiency patients.  Any of these clinical situations may lead to inaccurate quantitative predictions of fibrosis and necroinflammatory activity in the liver.    Comment Comment     Comment: This test  was developed and its performance characteristics determined by [facility].  It has not been cleared or approved by the Food and Drug Administration.  The FDA has determined that such clearance or approval is not necessary. For questions  regarding this report please contact customer service at [phone].   COMPLETE METABOLIC PANEL WITH GFR     Status: Abnormal   Collection Time: 03/19/20  2:13 PM  Result Value Ref Range   Glucose, Bld 104 65 - 139 mg/dL    Comment: .        Non-fasting reference interval .    BUN 11 7 - 25 mg/dL   Creat 0.73 0.50 - 1.10 mg/dL   GFR, Est Non African American 97 > OR = 60 mL/min/1.70m2   GFR, Est African American 113 > OR = 60 mL/min/1.14m2   BUN/Creatinine Ratio NOT APPLICABLE 6 - 22 (calc)   Sodium 138 135 - 146 mmol/L   Potassium 4.5 3.5 - 5.3 mmol/L   Chloride 103 98 - 110 mmol/L   CO2 31 20 - 32 mmol/L   Calcium 9.6 8.6 - 10.2 mg/dL   Total Protein 7.7 6.1 - 8.1 g/dL   Albumin 4.1 3.6 - 5.1 g/dL   Globulin 3.6 1.9 - 3.7 g/dL (calc)   AG Ratio 1.1 1.0 - 2.5 (calc)   Total Bilirubin 0.3 0.2 - 1.2 mg/dL   Alkaline phosphatase (APISO) 124 31 - 125 U/L   AST 24 10 - 35 U/L   ALT 35 (H) 6 - 29 U/L  CBC with Differential/Platelet     Status: Abnormal   Collection Time: 03/19/20  2:13 PM  Result Value Ref Range   WBC 4.6 3.8 - 10.8 Thousand/uL   RBC 5.16 (H) 3.80 - 5.10 Million/uL   Hemoglobin 13.6 11.7 - 15.5 g/dL   HCT 42.0 35 - 45 %   MCV 81.4 80.0 - 100.0 fL   MCH 26.4 (L) 27.0 - 33.0 pg   MCHC 32.4 32.0 - 36.0 g/dL   RDW 13.9 11.0 - 15.0 %   Platelets 269 140 - 400 Thousand/uL   MPV 10.9 7.5 - 12.5 fL   Neutro Abs 2,461 1,500 - 7,800 cells/uL   Lymphs Abs 1,693 850 - 3,900 cells/uL   Absolute Monocytes 359 200 - 950 cells/uL   Eosinophils Absolute 69 15.0 - 500.0 cells/uL   Basophils Absolute 18 0.0 - 200.0 cells/uL   Neutrophils Relative % 53.5 %   Total Lymphocyte 36.8 %   Monocytes Relative 7.8 %   Eosinophils Relative 1.5 %   Basophils Relative 0.4 %  Hemoglobin A1c     Status: Abnormal   Collection Time: 03/19/20  2:13 PM  Result Value Ref Range   Hgb A1c MFr Bld 6.3 (H) <5.7 % of total Hgb    Comment: For someone without known diabetes, a hemoglobin   A1c value between 5.7% and 6.4% is consistent with prediabetes and should be confirmed with a  follow-up test. . For someone with known diabetes, a value <7% indicates that their diabetes is well controlled. A1c targets should be individualized based on duration of diabetes, age, comorbid conditions, and other considerations. . This assay result is consistent with an increased risk of diabetes. . Currently, no consensus exists regarding use of hemoglobin A1c for diagnosis of diabetes for children. .    Mean Plasma Glucose 134 (calc)   eAG (mmol/L) 7.4 (calc)  VITAMIN D 25 Hydroxy (Vit-D Deficiency, Fractures)     Status: Abnormal  Collection Time: 03/19/20  2:13 PM  Result Value Ref Range   Vit D, 25-Hydroxy 21 (L) 30 - 100 ng/mL    Comment: Vitamin D Status         25-OH Vitamin D: . Deficiency:                    <20 ng/mL Insufficiency:             20 - 29 ng/mL Optimal:                 > or = 30 ng/mL . For 25-OH Vitamin D testing on patients on  D2-supplementation and patients for whom quantitation  of D2 and D3 fractions is required, the Quest[facility]ureD(TM) 25-OH VIT D, (D2,D3), LC/MS/MS is recommended: order  code [id number] (patients >16yrs). See Note 1 . Note 1 . For additional information, please refer to  http://education.[facility].com/faq/FAQ199  (This link is being provided for informational/ educational purposes only.)   Lipid panel     Status: Abnormal   Collection Time: 03/19/20  2:13 PM  Result Value Ref Range   Cholesterol 225 (H) <200 mg/dL   HDL 41 (L) > OR = 50 mg/dL   Triglycerides 60 <150 mg/dL   LDL Cholesterol (Calc) 169 (H) mg/dL (calc)    Comment: Reference range: <100 . Desirable range <100 mg/dL for primary prevention;   <70 mg/dL for patients with CHD or diabetic patients  with > or = 2 CHD risk factors. Marland Kitchen LDL-C is now calculated using the Martin-Hopkins  calculation, which is a validated novel method providing  better accuracy  than the Friedewald equation in the  estimation of LDL-C.  Cresenciano Genre et al. Annamaria Helling. 6433;295(18): 2061-2068  (http://education.[facility].com/faq/FAQ164)    Total CHOL/HDL Ratio 5.5 (H) <5.0 (calc)   Non-HDL Cholesterol (Calc) 184 (H) <130 mg/dL (calc)    Comment: For patients with diabetes plus 1 major ASCVD risk  factor, treating to a non-HDL-C goal of <100 mg/dL  (LDL-C of <70 mg/dL) is considered a therapeutic  option.   Pulmonary function test     Status: None   Collection Time: 04/18/20  2:15 PM  Result Value Ref Range   FEV1     FVC     FEV1/FVC     TLC     DLCO      Radiology: No results found.  No results found.  No results found.    Assessment and Plan: Patient Active Problem List   Diagnosis Date Noted   Current moderate episode of major depressive disorder ([place]) 02/17/2020   Grief reaction 02/17/2020   Stress reaction 02/17/2020   Morbid obesity ([place]) 05/31/2018   Colon cancer screening    Allergic rhinitis 03/01/2018   Cervical stenosis of spinal canal 08/28/2017   S/P vaginal hysterectomy 84/16/6063   History of Helicobacter pylori infection 08/08/2016   Microcytosis 08/01/2016   Heel spur, left 04/07/2016   Bilateral foot pain 04/04/2016   Anxiety 04/04/2016   Hypertension, goal below 140/90 07/24/2015   Bilateral hip pain 11/17/2014   Atopic dermatitis 11/17/2014   Low serum vitamin D 11/17/2014   Pre-diabetes 11/17/2014   Depression with anxiety 11/17/2014   Mild persistent asthma with allergic rhinitis without complication 01/60/1093    1. Persistent asthma without complication, unspecified asthma severity PFT normal, further samples of Breztri given in office today At next follow-up discussed finding alternative prescription options covered by insurance  2. Seasonal allergic rhinitis due to pollen Samples of Zyrtec  given in office today Advised to try different allergy medications and will need to take one  daily  3. Morbid obesity ([place]) Discussed importance of weight management and the impact obesity has on her breathing. Discussed making lifestyle changes to help with weight management.  General Counseling: I have discussed the findings of the evaluation and examination with Rodena Piety.  I have also discussed any further diagnostic evaluation thatmay be needed or ordered today. Leianna verbalizes understanding of the findings of todays visit. We also reviewed her medications today and discussed drug interactions and side effects including but not limited excessive drowsiness and altered mental states. We also discussed that there is always a risk not just to her but also people around her. she has been encouraged to call the office with any questions or concerns that should arise related to todays visit.     Time spent: 30  I have personally obtained a history, examined the patient, evaluated laboratory and imaging results, formulated the assessment and plan and placed orders. This patient was seen by Casey Burkitt AGNP-C in Collaboration with Dr. Devona Konig as a part of collaborative care agreement.    Allyne Gee, MD [facility] Pulmonary and Critical Care Sleep medicine

## 2020-05-07 NOTE — Patient Instructions (Signed)
Asthma, Adult  Asthma is a long-term (chronic) condition in which the airways get tight and narrow. The airways are the breathing passages that lead from the nose and mouth down into the lungs. A person with asthma will have times when symptoms get worse. These are called asthma attacks. They can cause coughing, whistling sounds when you breathe (wheezing), shortness of breath, and chest pain. They can make it hard to breathe. There is no cure for asthma, but medicines and lifestyle changes can help control it. There are many things that can bring on an asthma attack or make asthma symptoms worse (triggers). Common triggers include:  Mold.  Dust.  Cigarette smoke.  Cockroaches.  Things that can cause allergy symptoms (allergens). These include animal skin flakes (dander) and pollen from trees or grass.  Things that pollute the air. These may include household cleaners, wood smoke, smog, or chemical odors.  Cold air, weather changes, and wind.  Crying or laughing hard.  Stress.  Certain medicines or drugs.  Certain foods such as dried fruit, potato chips, and grape juice.  Infections, such as a cold or the flu.  Certain medical conditions or diseases.  Exercise or tiring activities. Asthma may be treated with medicines and by staying away from the things that cause asthma attacks. Types of medicines may include:  Controller medicines. These help prevent asthma symptoms. They are usually taken every day.  Fast-acting reliever or rescue medicines. These quickly relieve asthma symptoms. They are used as needed and provide short-term relief.  Allergy medicines if your attacks are brought on by allergens.  Medicines to help control the body's defense (immune) system. Follow these instructions at home: Avoiding triggers in your home  Change your heating and air conditioning filter often.  Limit your use of fireplaces and wood stoves.  Get rid of pests (such as roaches and  mice) and their droppings.  Throw away plants if you see mold on them.  Clean your floors. Dust regularly. Use cleaning products that do not smell.  Have someone vacuum when you are not home. Use a vacuum cleaner with a HEPA filter if possible.  Replace carpet with wood, tile, or vinyl flooring. Carpet can trap animal skin flakes and dust.  Use allergy-proof pillows, mattress covers, and box spring covers.  Wash bed sheets and blankets every week in hot water. Dry them in a dryer.  Keep your bedroom free of any triggers.  Avoid pets and keep windows closed when things that cause allergy symptoms are in the air.  Use blankets that are made of polyester or cotton.  Clean bathrooms and kitchens with bleach. If possible, have someone repaint the walls in these rooms with mold-resistant paint. Keep out of the rooms that are being cleaned and painted.  Wash your hands often with soap and water. If soap and water are not available, use hand sanitizer.  Do not allow anyone to smoke in your home. General instructions  Take over-the-counter and prescription medicines only as told by your doctor. ? Talk with your doctor if you have questions about how or when to take your medicines. ? Make note if you need to use your medicines more often than usual.  Do not use any products that contain nicotine or tobacco, such as cigarettes and e-cigarettes. If you need help quitting, ask your doctor.  Stay away from secondhand smoke.  Avoid doing things outdoors when allergen counts are high and when air quality is low.  Wear a ski mask   when doing outdoor activities in the winter. The mask should cover your nose and mouth. Exercise indoors on cold days if you can.  Warm up before you exercise. Take time to cool down after exercise.  Use a peak flow meter as told by your doctor. A peak flow meter is a tool that measures how well the lungs are working.  Keep track of the peak flow meter's readings.  Write them down.  Follow your asthma action plan. This is a written plan for taking care of your asthma and treating your attacks.  Make sure you get all the shots (vaccines) that your doctor recommends. Ask your doctor about a flu shot and a pneumonia shot.  Keep all follow-up visits as told by your doctor. This is important. Contact a doctor if:  You have wheezing, shortness of breath, or a cough even while taking medicine to prevent attacks.  The mucus you cough up (sputum) is thicker than usual.  The mucus you cough up changes from clear or white to yellow, green, gray, or bloody.  You have problems from the medicine you are taking, such as: ? A rash. ? Itching. ? Swelling. ? Trouble breathing.  You need reliever medicines more than 2-3 times a week.  Your peak flow reading is still at 50-79% of your personal best after following the action plan for 1 hour.  You have a fever. Get help right away if:  You seem to be worse and are not responding to medicine during an asthma attack.  You are short of breath even at rest.  You get short of breath when doing very little activity.  You have trouble eating, drinking, or talking.  You have chest pain or tightness.  You have a fast heartbeat.  Your lips or fingernails start to turn blue.  You are light-headed or dizzy, or you faint.  Your peak flow is less than 50% of your personal best.  You feel too tired to breathe normally. Summary  Asthma is a long-term (chronic) condition in which the airways get tight and narrow. An asthma attack can make it hard to breathe.  Asthma cannot be cured, but medicines and lifestyle changes can help control it.  Make sure you understand how to avoid triggers and how and when to use your medicines. This information is not intended to replace advice given to you by your health care provider. Make sure you discuss any questions you have with your health care provider. Document Revised:  07/29/2018 Document Reviewed: 06/30/2016 Elsevier Patient Education  2020 [employer]  

## 2020-05-08 ENCOUNTER — Telehealth: Payer: Self-pay

## 2020-05-08 ENCOUNTER — Other Ambulatory Visit: Payer: Self-pay | Admitting: Family Medicine

## 2020-05-08 DIAGNOSIS — F419 Anxiety disorder, unspecified: Secondary | ICD-10-CM

## 2020-05-08 DIAGNOSIS — F321 Major depressive disorder, single episode, moderate: Secondary | ICD-10-CM

## 2020-05-08 NOTE — Telephone Encounter (Signed)
lmom to pt to call us back

## 2020-05-09 ENCOUNTER — Encounter: Payer: Self-pay | Admitting: Internal Medicine

## 2020-05-10 ENCOUNTER — Telehealth: Payer: Self-pay

## 2020-05-10 NOTE — Telephone Encounter (Signed)
Confirmation of verbal order for Home medical equipment signed and placed in [facility] patient folder.

## 2020-06-21 ENCOUNTER — Ambulatory Visit: Payer: 59 | Admitting: Family Medicine

## 2020-06-22 ENCOUNTER — Telehealth: Payer: Self-pay

## 2020-06-22 NOTE — Telephone Encounter (Signed)
Confirmation of verbal order for home medical equipment signed by provider and placed in [facility] folder. 

## 2020-07-09 ENCOUNTER — Other Ambulatory Visit: Payer: Self-pay

## 2020-07-09 ENCOUNTER — Ambulatory Visit (INDEPENDENT_AMBULATORY_CARE_PROVIDER_SITE_OTHER): Payer: BC Managed Care – PPO | Admitting: Family Medicine

## 2020-07-09 ENCOUNTER — Encounter: Payer: Self-pay | Admitting: Family Medicine

## 2020-07-09 VITALS — BP 130/76 | HR 95 | Temp 98.0°F | Resp 16 | Ht 66.0 in | Wt 256.6 lb

## 2020-07-09 DIAGNOSIS — J4530 Mild persistent asthma, uncomplicated: Secondary | ICD-10-CM | POA: Diagnosis not present

## 2020-07-09 DIAGNOSIS — R7989 Other specified abnormal findings of blood chemistry: Secondary | ICD-10-CM | POA: Diagnosis not present

## 2020-07-09 DIAGNOSIS — I10 Essential (primary) hypertension: Secondary | ICD-10-CM | POA: Diagnosis not present

## 2020-07-09 DIAGNOSIS — F430 Acute stress reaction: Secondary | ICD-10-CM

## 2020-07-09 DIAGNOSIS — F321 Major depressive disorder, single episode, moderate: Secondary | ICD-10-CM

## 2020-07-09 DIAGNOSIS — F4321 Adjustment disorder with depressed mood: Secondary | ICD-10-CM

## 2020-07-09 DIAGNOSIS — R202 Paresthesia of skin: Secondary | ICD-10-CM | POA: Diagnosis not present

## 2020-07-09 DIAGNOSIS — R7303 Prediabetes: Secondary | ICD-10-CM | POA: Diagnosis not present

## 2020-07-09 DIAGNOSIS — E782 Mixed hyperlipidemia: Secondary | ICD-10-CM | POA: Diagnosis not present

## 2020-07-09 DIAGNOSIS — F419 Anxiety disorder, unspecified: Secondary | ICD-10-CM

## 2020-07-09 HISTORY — DX: Mixed hyperlipidemia: E78.2

## 2020-07-09 MED ORDER — UMECLIDINIUM-VILANTEROL 62.5-25 MCG/INH IN AEPB: 1.0000 | INHALATION_SPRAY | Freq: Every day | RESPIRATORY_TRACT | 5 refills | Status: DC

## 2020-07-09 MED ORDER — ESCITALOPRAM OXALATE 10 MG PO TABS: 10.0000 mg | ORAL_TABLET | Freq: Every day | ORAL | 3 refills | Status: DC

## 2020-07-09 MED ORDER — ALBUTEROL SULFATE HFA 108 (90 BASE) MCG/ACT IN AERS: 1.0000 | INHALATION_SPRAY | RESPIRATORY_TRACT | 5 refills | Status: DC | PRN

## 2020-07-09 MED ORDER — HYDROXYZINE HCL 10 MG PO TABS: 10.0000 mg | ORAL_TABLET | Freq: Four times a day (QID) | ORAL | 2 refills | Status: DC | PRN

## 2020-07-09 MED ORDER — MONTELUKAST SODIUM 10 MG PO TABS: 10.0000 mg | ORAL_TABLET | Freq: Every day | ORAL | 3 refills | Status: DC

## 2020-07-09 MED ORDER — AMLODIPINE BESYLATE 5 MG PO TABS: 5.0000 mg | ORAL_TABLET | Freq: Every evening | ORAL | 3 refills | Status: DC

## 2020-07-09 NOTE — Patient Instructions (Addendum)
Carpal Tunnel Syndrome  Carpal tunnel syndrome is a condition that causes pain, weakness, and numbness in your hand and arm. Numbness is when you cannot feel an area in your body. The carpal tunnel is a narrow area that is on the palm side of your wrist. Repeated wrist motion or certain diseases may cause swelling in the tunnel. This swelling can pinch the main nerve in the wrist. This nerve is called the median nerve. What are the causes? This condition may be caused by:  Moving your hand and wrist over and over again while doing a task.  Injury to the wrist.  Arthritis.  A sac of fluid (cyst) or abnormal growth (tumor) in the carpal tunnel.  Fluid buildup during pregnancy.  Use of tools that vibrate. Sometimes the cause is not known. What increases the risk? The following factors may make you more likely to have this condition:  Having a job that makes you do these things: ? Move your hand over and over again. ? Work with tools that vibrate, such as drills or sanders.  Being a woman.  Having diabetes, obesity, thyroid problems, or kidney failure. What are the signs or symptoms? Symptoms of this condition include:  A tingling feeling in your fingers.  Tingling or loss of feeling in your hand.  Pain in your entire arm. This pain may get worse when you bend your wrist and elbow for a long time.  Pain in your wrist that goes up your arm to your shoulder.  Pain that goes down into your palm or fingers.  Weakness in your hands. You may find it hard to grab and hold items. You may feel worse at night. How is this treated? This condition may be treated with:  Lifestyle changes. You will be asked to stop or change the activity that caused your problem.  Doing exercises and activities that make bones, muscles, and tendons stronger (physical therapy).  Learning how to use your hand again (occupational therapy).  Medicines for pain and swelling. You may have injections in  your wrist.  A wrist splint or brace.  Surgery. Follow these instructions at home: If you have a splint or brace:  Wear the splint or brace as told by your doctor. Take it off only as told by your doctor.  Loosen the splint if your fingers: ? Tingle. ? Become numb. ? Turn cold and blue.  Keep the splint or brace clean.  If the splint or brace is not waterproof: ? Do not let it get wet. ? Cover it with a watertight covering when you take a bath or a shower. Managing pain, stiffness, and swelling If told, put ice on the painful area:  If you have a removable splint or brace, remove it as told by your doctor.  Put ice in a plastic bag.  Place a towel between your skin and the bag.  Leave the ice on for 20 minutes, 2-3 times per day. Do not fall asleep with the cold pack on your skin.  Take off the ice if your skin turns bright red. This is very important. If you cannot feel pain, heat, or cold, you have a greater risk of damage to the area. Move your fingers often to reduce stiffness and swelling.   General instructions  Take over-the-counter and prescription medicines only as told by your doctor.  Rest your wrist from any activity that may cause pain. If needed, talk with your boss at work about changes that can   help your wrist heal.  Do exercises as told by your doctor, physical therapist, or occupational therapist.  Keep all follow-up visits. Contact a doctor if:  You have new symptoms.  Medicine does not help your pain.  Your symptoms get worse. Get help right away if:  You have very bad numbness or tingling in your wrist or hand. Summary  Carpal tunnel syndrome is a condition that causes pain in your hand and arm.  It is often caused by repeated wrist motions.  Lifestyle changes and medicines are used to treat this problem. Surgery may help in very bad cases.  Follow your doctor's instructions about wearing a splint, resting your wrist, keeping follow-up  visits, and calling for help. This information is not intended to replace advice given to you by your health care provider. Make sure you discuss any questions you have with your health care provider. Document Revised: 10/06/2019 Document Reviewed: 10/06/2019 Elsevier Patient Education  2021 Elsevier Inc.  

## 2020-07-09 NOTE — Progress Notes (Signed)
Name: Alisha Kim   MRN: [id number]    DOB: Mar 31, 1971   Date:07/09/2020       Progress Note  Chief Complaint  Patient presents with  . Hypertension  . Numbness    Bilateral hands     Subjective:   Alisha Kim is a 50 y.o. female, presents to clinic for routine f/up - new acute sx of hand numbness/tingling  Hypertension:  Currently managed on norvasc 5 mg  Pt reports good med compliance and denies any SE.   Blood pressure today is well controlled. BP Readings from Last 3 Encounters:  07/09/20 130/76  05/01/20 130/80  04/09/20 120/80   Pt denies CP, SOB, exertional sx, LE edema, palpitation, Ha's, visual disturbances, lightheadedness, hypotension, syncope.  Prediabetes Lab Results  Component Value Date   HGBA1C 6.3 (H) 03/19/2020    HLD Lab Results  Component Value Date   CHOL 225 (H) 03/19/2020   HDL 41 (L) 03/19/2020   LDLCALC 169 (H) 03/19/2020   TRIG 60 03/19/2020   CHOLHDL 5.5 (H) 03/19/2020  changed diet, avoiding red meat, cooking only with olive oil  Not out exercising like she should  She drinking more water, cut back on soda rare Wt Readings from Last 5 Encounters:  07/09/20 256 lb 9.6 oz (116.4 kg)  05/01/20 254 lb (115.2 kg)  04/09/20 255 lb 12.8 oz (116 kg)  03/22/20 255 lb 3.2 oz (115.8 kg)  03/19/20 245 lb 9.6 oz (111.4 kg)   BMI Readings from Last 5 Encounters:  07/09/20 41.42 kg/m  05/01/20 41.00 kg/m  04/09/20 41.29 kg/m  03/22/20 41.19 kg/m  03/19/20 37.34 kg/m       Current Outpatient Medications:  .  albuterol (PROAIR HFA) 108 (90 Base) MCG/ACT inhaler, Inhale 1-2 puffs into the lungs every 4 (four) hours as needed for wheezing or shortness of breath., Disp: 1 Inhaler, Rfl: 1 .  ALPRAZolam (XANAX) 0.25 MG tablet, Take 1-2 tablets (0.25-0.5 mg total) by mouth 2 (two) times daily as needed for anxiety (panic attacks)., Disp: 20 tablet, Rfl: 0 .  amLODipine (NORVASC) 5 MG tablet, TAKE 1 TABLET (5 MG TOTAL) BY MOUTH  EVERY EVENING., Disp: 90 tablet, Rfl: 3 .  escitalopram (LEXAPRO) 10 MG tablet, TAKE 1 TABLET BY MOUTH EVERY DAY, Disp: 90 tablet, Rfl: 2 .  fluticasone (FLOVENT HFA) 110 MCG/ACT inhaler, Inhale 2 puffs into the lungs 2 (two) times a day. Rinse mouth out after 2nd puff; use every day, Disp: 1 Inhaler, Rfl: 12 .  hydrOXYzine (ATARAX/VISTARIL) 10 MG tablet, Take 1-2 tablets (10-20 mg total) by mouth every 6 (six) hours as needed for itching., Disp: 120 tablet, Rfl: 2 .  PAZEO 0.7 % SOLN, Place 2 drops into both eyes as needed., Disp: , Rfl: 2  Patient Active Problem List   Diagnosis Date Noted  . Current moderate episode of major depressive disorder ([place]) 02/17/2020  . Grief reaction 02/17/2020  . Stress reaction 02/17/2020  . Morbid obesity ([place]) 05/31/2018  . Colon cancer screening   . Allergic rhinitis 03/01/2018  . Cervical stenosis of spinal canal 08/28/2017  . S/P vaginal hysterectomy 09/29/2016  . History of Helicobacter pylori infection 08/08/2016  . Microcytosis 08/01/2016  . Heel spur, left 04/07/2016  . Bilateral foot pain 04/04/2016  . Anxiety 04/04/2016  . Hypertension, goal below 140/90 07/24/2015  . Bilateral hip pain 11/17/2014  . Atopic dermatitis 11/17/2014  . Low serum vitamin D 11/17/2014  . Pre-diabetes 11/17/2014  . Depression with  anxiety 11/17/2014  . Mild persistent asthma with allergic rhinitis without complication 64/40/3474    Past Surgical History:  Procedure Laterality Date  . ABDOMINAL HYSTERECTOMY    . COLONOSCOPY WITH PROPOFOL N/A 05/17/2018   Procedure: COLONOSCOPY WITH PROPOFOL;  Surgeon: Lin Landsman, MD;  Location: [facility] ENDOSCOPY;  Service: Gastroenterology;  Laterality: N/A;  . DENTAL SURGERY    . NO PAST SURGERIES    . TONSILLECTOMY    . VAGINAL HYSTERECTOMY Bilateral 09/29/2016   Procedure: HYSTERECTOMY VAGINAL WITH BILATERAL SALPINGECTOMY;  Surgeon: [name], MD;  Location: [facility] ORS;  Service: Gynecology;  Laterality: Bilateral;     Family History  Problem Relation Age of Onset  . Alcohol abuse Mother   . Arthritis Mother   . Asthma Mother   . Depression Mother   . Drug abuse Mother   . Hypertension Mother   . Mental illness Mother   . Cancer Mother        breast and lung  . Diabetes Mother   . Breast cancer Mother 52  . Alcohol abuse Father   . Drug abuse Father   . Cancer Father        lung  . Hypertension Brother   . Hearing loss Maternal Aunt   . Hypertension Maternal Aunt   . Stroke Maternal Aunt   . Alzheimer's disease Maternal Aunt   . Breast cancer Maternal Aunt        mat great aunt  . Cancer Maternal Grandmother        cervical  . Heart disease Maternal Grandmother   . Hypertension Maternal Grandmother     Social History   Tobacco Use  . Smoking status: Never Smoker  . Smokeless tobacco: Never Used  Vaping Use  . Vaping Use: Never used  Substance Use Topics  . Alcohol use: Not Currently    Alcohol/week: 0.0 standard drinks  . Drug use: No     Allergies  Allergen Reactions  . Meloxicam Other (See Comments)    Felt horrible, headache, bloating, back pain, SHOB; no rash  . Zithromax [Azithromycin] Swelling    Tongue   . Pseudoephedrine-Guaifenesin Swelling    Of tongue  . Shellfish Allergy Rash  . Shellfish-Derived Products Rash    Health Maintenance  Topic Date Due  . MAMMOGRAM  12/22/2018  . PAP SMEAR-Modifier  11/12/2019  . COVID-19 Vaccine (3 - Booster for Pfizer series) 08/06/2020 (Originally 01/09/2020)  . TETANUS/TDAP  06/09/2024  . COLONOSCOPY (Pts 45-43yrs Insurance coverage will need to be confirmed)  05/17/2028  . INFLUENZA VACCINE  Completed  . Hepatitis C Screening  Completed  . HIV Screening  Completed    Chart Review Today: I personally reviewed active problem list, medication list, allergies, family history, social history, health maintenance, notes from last encounter, lab results, imaging with the patient/caregiver today.   Review of Systems   Constitutional: Negative.   HENT: Negative.   Eyes: Negative.   Respiratory: Negative.   Cardiovascular: Negative.   Gastrointestinal: Negative.   Endocrine: Negative.   Genitourinary: Negative.   Musculoskeletal: Negative.   Skin: Negative.   Allergic/Immunologic: Negative.   Neurological: Negative.   Hematological: Negative.   Psychiatric/Behavioral: Negative.   All other systems reviewed and are negative.   10 Systems reviewed and are negative for acute change except as noted in the HPI.  Objective:   Vitals:   07/09/20 1539  BP: 130/76  Pulse: 95  Resp: 16  Temp: 98 F (36.7 C)  TempSrc:  Oral  SpO2: 99%  Weight: 256 lb 9.6 oz (116.4 kg)  Height: 5\' 6"  (1.676 m)    Body mass index is 41.42 kg/m.  Physical Exam Vitals and nursing note reviewed.  Constitutional:      General: She is not in acute distress.    Appearance: Normal appearance. She is well-developed. She is not ill-appearing, toxic-appearing or diaphoretic.     Interventions: Face mask in place.  HENT:     Head: Normocephalic and atraumatic.     Right Ear: External ear normal.     Left Ear: External ear normal.  Eyes:     General: Lids are normal. No scleral icterus.       Right eye: No discharge.        Left eye: No discharge.     Conjunctiva/sclera: Conjunctivae normal.  Neck:     Trachea: Phonation normal. No tracheal deviation.  Cardiovascular:     Rate and Rhythm: Normal rate and regular rhythm.     Pulses: Normal pulses.          Radial pulses are 2+ on the right side and 2+ on the left side.       Posterior tibial pulses are 2+ on the right side and 2+ on the left side.     Heart sounds: Normal heart sounds. No murmur heard. No friction rub. No gallop.   Pulmonary:     Effort: Pulmonary effort is normal. No respiratory distress.     Breath sounds: Normal breath sounds. No stridor. No wheezing, rhonchi or rales.  Chest:     Chest wall: No tenderness.  Abdominal:     General: Bowel  sounds are normal. There is no distension.     Palpations: Abdomen is soft.  Musculoskeletal:     Right lower leg: No edema.     Left lower leg: No edema.  Skin:    General: Skin is warm and dry.     Coloration: Skin is not jaundiced or pale.     Findings: No rash.  Neurological:     Mental Status: She is alert.     Motor: No abnormal muscle tone.     Gait: Gait normal.  Psychiatric:        Mood and Affect: Mood normal.        Speech: Speech normal.        Behavior: Behavior normal.         Assessment & Plan:     ICD-10-CM   1. Low serum vitamin D  [id number] COMPLETE METABOLIC PANEL WITH GFR  2. Pre-diabetes  [id number] COMPLETE METABOLIC PANEL WITH GFR    Hemoglobin A1c   Recheck  3. Mild persistent asthma with allergic rhinitis without complication  [id number] albuterol (PROAIR HFA) 108 (90 Base) MCG/ACT inhaler    umeclidinium-vilanterol (ANORO ELLIPTA) 62.5-25 MCG/INH AEPB    hydrOXYzine (ATARAX/VISTARIL) 10 MG tablet    montelukast (SINGULAIR) 10 MG tablet   Meds refilled, currently well controlled  4. Hypertension, goal below 140/90  99991111 COMPLETE METABOLIC PANEL WITH GFR    amLODipine (NORVASC) 5 MG tablet   Stable, well-controlled, blood pressure at goal today  5. Anxiety  F41.9 escitalopram (LEXAPRO) 10 MG tablet    hydrOXYzine (ATARAX/VISTARIL) 10 MG tablet   GAD-7 reviewed and negative, symptoms improved  6. Mixed hyperlipidemia  99991111 COMPLETE METABOLIC PANEL WITH GFR    Lipid panel  7. Paresthesia of both hands  [id number] COMPLETE METABOLIC PANEL WITH GFR   suspect  carpal tunnel, brace, rest, referral offered  8. Hypertension, goal below 140/90  X83 COMPLETE METABOLIC PANEL WITH GFR    amLODipine (NORVASC) 5 MG tablet   controlled; continue med  9. Current moderate episode of major depressive disorder, unspecified whether recurrent (HCC)  F32.1 escitalopram (LEXAPRO) 10 MG tablet    hydrOXYzine (ATARAX/VISTARIL) 10 MG tablet   PHQ-9 reviewed and negative,  well-controlled  10. Grief reaction  F43.21 hydrOXYzine (ATARAX/VISTARIL) 10 MG tablet  11. Stress reaction  F43.0 hydrOXYzine (ATARAX/VISTARIL) 10 MG tablet   Depression screen [facility] 2/9 07/09/2020 07/09/2020 03/19/2020  Decreased Interest 0 0 0  Down, Depressed, Hopeless 0 0 0  PHQ - 2 Score 0 0 0  Altered sleeping 1 - 0  Tired, decreased energy 0 - 0  Change in appetite 0 - 0  Feeling bad or failure about yourself  0 - 0  Trouble concentrating 0 - 0  Moving slowly or fidgety/restless 0 - 0  Suicidal thoughts 0 - 0  PHQ-9 Score 1 - 0  Difficult doing work/chores Not difficult at all - Not difficult at all  Some recent data might be hidden   GAD 7 : Generalized Anxiety Score 07/09/2020 02/17/2020 10/08/2018  Nervous, Anxious, on Edge 0 3 3  Control/stop worrying 0 2 3  Worry too much - different things 0 1 3  Trouble relaxing 0 1 2  Restless 0 1 0  Easily annoyed or irritable 0 1 2  Afraid - awful might happen 0 1 0  Total GAD 7 Score 0 10 13  Anxiety Difficulty Not difficult at all Somewhat difficult Somewhat difficult       No follow-ups on file.   Delsa Grana, PA-C 07/09/20 4:01 PM

## 2020-07-10 ENCOUNTER — Other Ambulatory Visit: Payer: Self-pay | Admitting: Family Medicine

## 2020-07-10 DIAGNOSIS — Z1231 Encounter for screening mammogram for malignant neoplasm of breast: Secondary | ICD-10-CM

## 2020-07-10 LAB — COMPLETE METABOLIC PANEL WITH GFR
AG Ratio: 1.1 (calc) (ref 1.0–2.5)
ALT: 27 U/L (ref 6–29)
AST: 20 U/L (ref 10–35)
Albumin: 3.8 g/dL (ref 3.6–5.1)
Alkaline phosphatase (APISO): 118 U/L (ref 31–125)
BUN: 8 mg/dL (ref 7–25)
CO2: 28 mmol/L (ref 20–32)
Calcium: 9.2 mg/dL (ref 8.6–10.2)
Chloride: 102 mmol/L (ref 98–110)
Creat: 0.68 mg/dL (ref 0.50–1.10)
GFR, Est African American: 119 mL/min/{1.73_m2} (ref >=60)
GFR, Est Non African American: 103 mL/min/{1.73_m2} (ref >=60)
Globulin: 3.5 g/dL (calc) (ref 1.9–3.7)
Glucose, Bld: 97 mg/dL (ref 65–99)
Potassium: 3.9 mmol/L (ref 3.5–5.3)
Sodium: 137 mmol/L (ref 135–146)
Total Bilirubin: 0.2 mg/dL (ref 0.2–1.2)
Total Protein: 7.3 g/dL (ref 6.1–8.1)

## 2020-07-10 LAB — LIPID PANEL
Cholesterol: 206 mg/dL — ABNORMAL HIGH (ref <200)
HDL: 41 mg/dL — ABNORMAL LOW (ref >=50)
LDL Cholesterol (Calc): 149 mg/dL (calc) — ABNORMAL HIGH
Non-HDL Cholesterol (Calc): 165 mg/dL (calc) — ABNORMAL HIGH (ref <130)
Total CHOL/HDL Ratio: 5 (calc) — ABNORMAL HIGH (ref <5.0)
Triglycerides: 69 mg/dL (ref <150)

## 2020-07-10 LAB — HEMOGLOBIN A1C
Hgb A1c MFr Bld: 6.2 % of total Hgb — ABNORMAL HIGH (ref <5.7)
Mean Plasma Glucose: 131 mg/dL
eAG (mmol/L): 7.3 mmol/L

## 2020-07-25 ENCOUNTER — Ambulatory Visit
Admission: RE | Admit: 2020-07-25 | Discharge: 2020-07-25 | Disposition: A | Discharge status: Home / Self Care | Payer: BC Managed Care – PPO | Source: Ambulatory Visit | Attending: Family Medicine | Admitting: Family Medicine

## 2020-07-25 ENCOUNTER — Other Ambulatory Visit: Payer: Self-pay

## 2020-07-25 DIAGNOSIS — Z1231 Encounter for screening mammogram for malignant neoplasm of breast: Secondary | ICD-10-CM | POA: Insufficient documentation

## 2020-07-30 ENCOUNTER — Encounter: Payer: Self-pay | Admitting: Family Medicine

## 2020-08-01 LAB — HM DIABETES EYE EXAM

## 2020-08-10 ENCOUNTER — Other Ambulatory Visit: Payer: Self-pay | Admitting: Family Medicine

## 2020-08-10 DIAGNOSIS — F321 Major depressive disorder, single episode, moderate: Secondary | ICD-10-CM

## 2020-08-10 DIAGNOSIS — J4530 Mild persistent asthma, uncomplicated: Secondary | ICD-10-CM

## 2020-08-10 DIAGNOSIS — F419 Anxiety disorder, unspecified: Secondary | ICD-10-CM

## 2020-08-28 ENCOUNTER — Ambulatory Visit: Payer: 59 | Admitting: Internal Medicine

## 2020-09-14 ENCOUNTER — Encounter: Payer: Self-pay | Admitting: Family Medicine

## 2020-10-01 ENCOUNTER — Other Ambulatory Visit: Payer: Self-pay | Admitting: Family Medicine

## 2020-10-01 DIAGNOSIS — J4530 Mild persistent asthma, uncomplicated: Secondary | ICD-10-CM

## 2020-11-06 ENCOUNTER — Ambulatory Visit: Payer: BC Managed Care – PPO | Admitting: Family Medicine

## 2020-11-08 ENCOUNTER — Telehealth (INDEPENDENT_AMBULATORY_CARE_PROVIDER_SITE_OTHER): Payer: BLUE CROSS/BLUE SHIELD | Admitting: Unknown Physician Specialty

## 2020-11-08 ENCOUNTER — Other Ambulatory Visit: Payer: Self-pay

## 2020-11-08 ENCOUNTER — Encounter: Payer: Self-pay | Admitting: Unknown Physician Specialty

## 2020-11-08 DIAGNOSIS — G5603 Carpal tunnel syndrome, bilateral upper limbs: Secondary | ICD-10-CM | POA: Diagnosis not present

## 2020-11-08 NOTE — Progress Notes (Signed)
LMP 09/29/2016 Comment: partial   Subjective:    Patient ID: Alisha Kim, female    DOB: [birth year], 50 y.o.   MRN: [id number]  HPI: Alisha Kim is a 50 y.o. female  Chief Complaint  Patient presents with  . Numbness    Bilateral hands, Wakes her out of her sleep   Due to the catastrophic nature of the COVID-19 pandemic, this visit was completed via audio and visual contact via [employer] due to the restrictions of the COVID-19 pandemic. All issues as above were discussed and addressed. Physical exam was done as above through visual confirmation on [employer]. If it was felt that the patient should be evaluated in the office, they were directed there. The patient verbally consented to this visit."} . Location of the patient: home . Location of the provider: home . Those involved with this call:  . Provider: Kathrine Haddock, DNP . [id number] [place].  .  . Time spent on call: 15 minutes with patient face to face via video conference. More than 50% of this time was spent in counseling and coordination of care. 5 minutes total spent in review of patient's record and preparation of their chart.  I verified patient identity using two factors (patient name and date of birth). Patient consents verbally to being seen via telemedicine visit today.    Pt states she is having trouble with bilateral hand numbness.  She discussed with [name] previously and tried wearing braces.  The pain and numbness wakes her up at night.  Initially it was diagnosed as carpal tunnel as she works as a Geophysicist/field seismologist as a hobby.  Discussed the splint she was using and seems using a thumb spica.    Relevant past medical, surgical, family and social history reviewed and updated as indicated. Interim medical history since our last visit reviewed. Allergies and medications reviewed and updated.  Review of Systems  Per HPI unless specifically indicated above     Objective:    LMP 09/29/2016  Comment: partial  Wt Readings from Last 3 Encounters:  07/09/20 256 lb 9.6 oz (116.4 kg)  05/01/20 254 lb (115.2 kg)  04/09/20 255 lb 12.8 oz (116 kg)    Physical Exam Constitutional:      General: She is not in acute distress.    Appearance: Normal appearance. She is well-developed.  HENT:     Head: Normocephalic and atraumatic.     Nose: No rhinorrhea.     Right Sinus: No maxillary sinus tenderness or frontal sinus tenderness.     Left Sinus: No maxillary sinus tenderness or frontal sinus tenderness.     Mouth/Throat:     Mouth: Mucous membranes are moist.  Eyes:     General: Lids are normal. No scleral icterus.       Right eye: No discharge.        Left eye: No discharge.     Conjunctiva/sclera: Conjunctivae normal.  Pulmonary:     Effort: Pulmonary effort is normal. No respiratory distress.  Abdominal:     Palpations: There is no hepatomegaly or splenomegaly.  Musculoskeletal:        General: Normal range of motion.  Skin:    Coloration: Skin is not pale.     Findings: No erythema or rash.  Neurological:     Mental Status: She is alert and oriented to person, place, and time.  Psychiatric:        Behavior: Behavior normal.  Thought Content: Thought content normal.        Judgment: Judgment normal.   Phalen's sign is positive  Results for orders placed or performed in visit on 08/02/20  HM DIABETES EYE EXAM  Result Value Ref Range   HM Diabetic Eye Exam No Retinopathy No Retinopathy      Assessment & Plan:   Problem List Items Addressed This Visit   None   Visit Diagnoses    Bilateral carpal tunnel syndrome    -  Primary   Worsening carpal tunnel though it seems wearing the wrong splint.  Pt ed on splint selection and wearing at night.  Refer to orthopedics.     Relevant Orders   AMB referral to orthopedics       Follow up plan: Return with orthopedics.

## 2020-11-19 ENCOUNTER — Other Ambulatory Visit: Payer: Self-pay

## 2020-11-19 ENCOUNTER — Ambulatory Visit
Admission: EM | Admit: 2020-11-19 | Discharge: 2020-11-19 | Disposition: A | Discharge status: Home / Self Care | Payer: BC Managed Care – PPO | Attending: Family Medicine | Admitting: Family Medicine

## 2020-11-19 DIAGNOSIS — U071 COVID-19: Secondary | ICD-10-CM | POA: Insufficient documentation

## 2020-11-19 LAB — RESP PANEL BY RT-PCR (FLU A&B, COVID) ARPGX2
Influenza A by PCR: NEGATIVE
Influenza B by PCR: NEGATIVE
SARS Coronavirus 2 by RT PCR: POSITIVE — AB

## 2020-11-19 MED ORDER — PROMETHAZINE-DM 6.25-15 MG/5ML PO SYRP: 5.0000 mL | ORAL_SOLUTION | Freq: Four times a day (QID) | ORAL | 0 refills | Status: DC | PRN

## 2020-11-19 MED ORDER — IPRATROPIUM BROMIDE 0.06 % NA SOLN: 2.0000 | Freq: Four times a day (QID) | NASAL | 12 refills | Status: DC

## 2020-11-19 MED ORDER — BENZONATATE 100 MG PO CAPS: 200.0000 mg | ORAL_CAPSULE | Freq: Three times a day (TID) | ORAL | 0 refills | Status: DC

## 2020-11-19 MED ORDER — MOLNUPIRAVIR EUA 200MG CAPSULE: 4.0000 | ORAL_CAPSULE | Freq: Two times a day (BID) | ORAL | 0 refills | Status: DC

## 2020-11-19 MED ORDER — MOLNUPIRAVIR EUA 200MG CAPSULE: 4.0000 | ORAL_CAPSULE | Freq: Two times a day (BID) | ORAL | 0 refills | Status: AC

## 2020-11-19 NOTE — Discharge Instructions (Addendum)
You will have to quarantine for 5 days from the start of your symptoms.  After 5 days you can break quarantine if your symptoms have improved and you have not had a fever for 24 hours without taking Tylenol or ibuprofen.  Use over-the-counter Tylenol and ibuprofen as needed for body aches and fever.  Use the Atrovent nasal spray, 2 squirts of each nostril every 6 hours, as needed for nasal congestion and runny nose.  Use the Tessalon Perles during the day as needed for cough and the Promethazine DM cough syrup at nighttime as will make you drowsy.  If you develop any increased shortness of breath-especially at rest, you are unable to speak in full sentences, or is a late sign your lips are turning blue you need to go the ER for evaluation.

## 2020-11-19 NOTE — ED Triage Notes (Signed)
Patient states that she was exposed to covid at a cookout this weekend. States that she woke up Sunday with nasal congestion, facial pain and pressure, cough and fatigue.

## 2020-11-19 NOTE — ED Provider Notes (Signed)
[facility]    CSN: [id number] Arrival date & time: 11/19/20  1414      History   Chief Complaint Chief Complaint  Patient presents with   Covid Exposure   Nasal Congestion    HPI MARYURI WARNKE is a 50 y.o. female.   HPI  50 year old female here for evaluation of respiratory complaints.  Patient reports that she was exposed to [place] over the weekend at a cookout and yesterday she developed nasal congestion, facial pain, runny nose with nasal discharge, productive cough for yellow sputum, shortness of breath and wheezing, diarrhea, and body aches.  She is also had fatigue.  She denies nausea or vomiting.  She has had her COVID-vaccine but has not received a booster shot and she has had her flu shot.  Patient does have a history of asthma and states when she gets an upper respiratory infection it does tend to aggravate her asthma.  She has had to use her rescue inhaler 3 times in the last 2 days.  Past Medical History:  Diagnosis Date   Adenomyosis 09/2015   Allergy    Anemia    history of   Anxiety    controlled;    Asthma    Depression with anxiety    Diabetes mellitus without complication [facility])    was told she was pre-diabetic   Fibroids 08/14/2016   [place] March 1007   History of Helicobacter pylori infection 08/08/2016   2004   Hydrosalpinx 08/14/2016   right   Hypertension    controlled with medication;    Mixed hyperlipidemia 07/09/2020   Pre-diabetes     Patient Active Problem List   Diagnosis Date Noted   Mixed hyperlipidemia 07/09/2020   Current moderate episode of major depressive disorder ([place]) 02/17/2020   Grief reaction 02/17/2020   Stress reaction 02/17/2020   Morbid obesity ([place]) 05/31/2018   Colon cancer screening    Allergic rhinitis 03/01/2018   Cervical stenosis of spinal canal 08/28/2017   S/P vaginal hysterectomy 05/28/7587   History of Helicobacter pylori infection 08/08/2016   Microcytosis 08/01/2016   Heel spur, left 04/07/2016    Bilateral foot pain 04/04/2016   Anxiety 04/04/2016   Hypertension, goal below 140/90 07/24/2015   Bilateral hip pain 11/17/2014   Atopic dermatitis 11/17/2014   Low serum vitamin D 11/17/2014   Pre-diabetes 11/17/2014   Depression with anxiety 11/17/2014   Mild persistent asthma with allergic rhinitis without complication 32/54/9826    Past Surgical History:  Procedure Laterality Date   ABDOMINAL HYSTERECTOMY     COLONOSCOPY WITH PROPOFOL N/A 05/17/2018   Procedure: COLONOSCOPY WITH PROPOFOL;  Surgeon: Lin Landsman, MD;  Location: [facility] ENDOSCOPY;  Service: Gastroenterology;  Laterality: N/A;   DENTAL SURGERY     NO PAST SURGERIES     TONSILLECTOMY     VAGINAL HYSTERECTOMY Bilateral 09/29/2016   Procedure: HYSTERECTOMY VAGINAL WITH BILATERAL SALPINGECTOMY;  Surgeon: [name], MD;  Location: [facility] ORS;  Service: Gynecology;  Laterality: Bilateral;    OB History     Gravida  1   Para  1   Term  1   Preterm      AB      Living  1      SAB      IAB      Ectopic      Multiple      Live Births  1            Home Medications  Prior to Admission medications   Medication Sig Start Date End Date Taking? Authorizing Provider  albuterol (PROAIR HFA) 108 (90 Base) MCG/ACT inhaler Inhale 1-2 puffs into the lungs every 4 (four) hours as needed for wheezing or shortness of breath. 07/09/20  Yes Delsa Grana, PA-C  amLODipine (NORVASC) 5 MG tablet Take 1 tablet (5 mg total) by mouth every evening. 07/09/20  Yes Tapia, Leisa, PA-C  benzonatate (TESSALON) 100 MG capsule Take 2 capsules (200 mg total) by mouth every 8 (eight) hours. 11/19/20  Yes Margarette Canada, NP  escitalopram (LEXAPRO) 10 MG tablet Take 1 tablet (10 mg total) by mouth daily. 07/09/20  Yes Delsa Grana, PA-C  hydrOXYzine (ATARAX/VISTARIL) 10 MG tablet Take 1-2 tablets (10-20 mg total) by mouth every 6 (six) hours as needed for itching. 07/09/20  Yes Delsa Grana, PA-C  ipratropium (ATROVENT) 0.06 %  nasal spray Place 2 sprays into both nostrils 4 (four) times daily. 11/19/20  Yes Margarette Canada, NP  molnupiravir EUA 200 mg CAPS Take 4 capsules (800 mg total) by mouth 2 (two) times daily for 5 days. 11/19/20 11/24/20 Yes Margarette Canada, NP  montelukast (SINGULAIR) 10 MG tablet TAKE 1 TABLET BY MOUTH EVERYDAY AT BEDTIME 10/01/20  Yes Tapia, Leisa, PA-C  PAZEO 0.7 % SOLN Place 2 drops into both eyes as needed. 08/28/17  Yes [provider]  promethazine-dextromethorphan (PROMETHAZINE-DM) 6.25-15 MG/5ML syrup Take 5 mLs by mouth 4 (four) times daily as needed. 11/19/20  Yes Margarette Canada, NP  umeclidinium-vilanterol (ANORO ELLIPTA) 62.5-25 MCG/INH AEPB Inhale 1 puff into the lungs daily. 07/09/20  Yes Delsa Grana, PA-C    Family History Family History  Problem Relation Age of Onset   Alcohol abuse Mother    Arthritis Mother    Asthma Mother    Depression Mother    Drug abuse Mother    Hypertension Mother    Mental illness Mother    Cancer Mother        breast and lung   Diabetes Mother    Breast cancer Mother 61   Alcohol abuse Father    Drug abuse Father    Cancer Father        lung   Hypertension Brother    Hearing loss Maternal Aunt    Hypertension Maternal Aunt    Stroke Maternal Aunt    Alzheimer's disease Maternal Aunt    Breast cancer Maternal Aunt        mat great aunt   Cancer Maternal Grandmother        cervical   Heart disease Maternal Grandmother    Hypertension Maternal Grandmother     Social History Social History   Tobacco Use   Smoking status: Never   Smokeless tobacco: Never  Vaping Use   Vaping Use: Never used  Substance Use Topics   Alcohol use: Not Currently    Alcohol/week: 0.0 standard drinks   Drug use: No     Allergies   Meloxicam, Zithromax [azithromycin], Pseudoephedrine-guaifenesin, Shellfish allergy, and Shellfish-derived products   Review of Systems Review of Systems  Constitutional:  Positive for fatigue and fever. Negative for  activity change and appetite change.  HENT:  Positive for congestion, postnasal drip, rhinorrhea, sinus pressure and sinus pain.   Respiratory:  Positive for cough, shortness of breath and wheezing.   Gastrointestinal:  Positive for diarrhea. Negative for nausea and vomiting.  Musculoskeletal:  Positive for arthralgias and myalgias.  Skin:  Negative for rash.  Hematological: Negative.   Psychiatric/Behavioral: Negative.  Physical Exam Triage Vital Signs ED Triage Vitals  Enc Vitals Group     BP 11/19/20 1523 (!) 136/97     Pulse Rate 11/19/20 1523 88     Resp 11/19/20 1523 18     Temp 11/19/20 1523 98.5 F (36.9 C)     Temp Source 11/19/20 1523 Oral     SpO2 11/19/20 1523 99 %     Weight 11/19/20 1520 260 lb (117.9 kg)     Height 11/19/20 1520 5\' 6"  (1.676 m)     Head Circumference --      Peak Flow --      Pain Score 11/19/20 1519 0     Pain Loc --      Pain Edu? --      Excl. in [place]? --    No data found.  Updated Vital Signs BP (!) 136/97 (BP Location: Left Arm)   Pulse 88   Temp 98.5 F (36.9 C) (Oral)   Resp 18   Ht 5\' 6"  (1.676 m)   Wt 260 lb (117.9 kg)   LMP 09/29/2016 Comment: partial  SpO2 99%   BMI 41.97 kg/m   Visual Acuity Right Eye Distance:   Left Eye Distance:   Bilateral Distance:    Right Eye Near:   Left Eye Near:    Bilateral Near:     Physical Exam Vitals and nursing note reviewed.  Constitutional:      General: She is not in acute distress.    Appearance: Normal appearance. She is obese. She is not ill-appearing.  HENT:     Head: Normocephalic and atraumatic.     Right Ear: Tympanic membrane, ear canal and external ear normal. There is no impacted cerumen.     Left Ear: Tympanic membrane, ear canal and external ear normal. There is no impacted cerumen.     Nose: Congestion and rhinorrhea present.     Mouth/Throat:     Mouth: Mucous membranes are moist.     Pharynx: Oropharynx is clear. Posterior oropharyngeal erythema present.   Cardiovascular:     Rate and Rhythm: Normal rate and regular rhythm.     Pulses: Normal pulses.     Heart sounds: Normal heart sounds. No murmur heard.   No gallop.  Pulmonary:     Effort: Pulmonary effort is normal.     Breath sounds: Normal breath sounds. No wheezing, rhonchi or rales.  Musculoskeletal:     Cervical back: Normal range of motion and neck supple.  Lymphadenopathy:     Cervical: No cervical adenopathy.  Skin:    General: Skin is warm and dry.     Capillary Refill: Capillary refill takes less than 2 seconds.     Findings: No erythema or rash.  Neurological:     General: No focal deficit present.     Mental Status: She is alert and oriented to person, place, and time.  Psychiatric:        Mood and Affect: Mood normal.        Behavior: Behavior normal.        Thought Content: Thought content normal.        Judgment: Judgment normal.     UC Treatments / Results  Labs (all labs ordered are listed, but only abnormal results are displayed) Labs Reviewed  RESP PANEL BY RT-PCR (FLU A&B, COVID) ARPGX2 - Abnormal; Notable for the following components:      Result Value   SARS Coronavirus 2 by RT PCR  POSITIVE (*)    All other components within normal limits    EKG   Radiology No results found.  Procedures Procedures (including critical care time)  Medications Ordered in UC Medications - No data to display  Initial Impression / Assessment and Plan / UC Course  I have reviewed the triage vital signs and the nursing notes.  Pertinent labs & imaging results that were available during my care of the patient were reviewed by me and considered in my medical decision making (see chart for details).  Patient is a very pleasant, nontoxic-appearing 49 year old female here for evaluation of respiratory complaints as outlined in HPI above after being exposed to COVID at a barbecue.  Patient's physical exam reveals pearly gray tympanic membranes bilaterally with a normal  light reflex and clear external auditory canals.  Nasal mucosa is erythematous and edematous with scant clear discharge.  Oropharyngeal exam reveals posterior oropharyngeal erythema with clear postnasal drip.  No cervical of adenopathy.  Cardiopulmonary exam is benign.  Respiratory triplex panel collected at triage.  Respiratory triplex panel is positive for COVID.  Will discharge patient home on molnupiravir for treatment of her COVID, ipratropium nasal spray for nasal congestion, Tessalon Perles and Promethazine DM for cough and congestion.   Final Clinical Impressions(s) / UC Diagnoses   Final diagnoses:  [id number]     Discharge Instructions      You will have to quarantine for 5 days from the start of your symptoms.  After 5 days you can break quarantine if your symptoms have improved and you have not had a fever for 24 hours without taking Tylenol or ibuprofen.  Use over-the-counter Tylenol and ibuprofen as needed for body aches and fever.  Use the Atrovent nasal spray, 2 squirts of each nostril every 6 hours, as needed for nasal congestion and runny nose.  Use the Tessalon Perles during the day as needed for cough and the Promethazine DM cough syrup at nighttime as will make you drowsy.  If you develop any increased shortness of breath-especially at rest, you are unable to speak in full sentences, or is a late sign your lips are turning blue you need to go the ER for evaluation.      ED Prescriptions     Medication Sig Dispense Auth. Provider   benzonatate (TESSALON) 100 MG capsule Take 2 capsules (200 mg total) by mouth every 8 (eight) hours. 21 capsule Margarette Canada, NP   ipratropium (ATROVENT) 0.06 % nasal spray Place 2 sprays into both nostrils 4 (four) times daily. 15 mL Margarette Canada, NP   promethazine-dextromethorphan (PROMETHAZINE-DM) 6.25-15 MG/5ML syrup Take 5 mLs by mouth 4 (four) times daily as needed. 118 mL Margarette Canada, NP   molnupiravir EUA 200 mg CAPS Take 4  capsules (800 mg total) by mouth 2 (two) times daily for 5 days. 40 capsule Margarette Canada, NP      PDMP not reviewed this encounter.   Margarette Canada, NP 11/19/20 1623

## 2020-11-27 DIAGNOSIS — G5603 Carpal tunnel syndrome, bilateral upper limbs: Secondary | ICD-10-CM | POA: Diagnosis not present

## 2020-12-26 ENCOUNTER — Ambulatory Visit
Admission: EM | Admit: 2020-12-26 | Discharge: 2020-12-26 | Disposition: A | Discharge status: Home / Self Care | Payer: BC Managed Care – PPO | Attending: Sports Medicine | Admitting: Sports Medicine

## 2020-12-26 ENCOUNTER — Other Ambulatory Visit: Payer: Self-pay

## 2020-12-26 DIAGNOSIS — R0981 Nasal congestion: Secondary | ICD-10-CM

## 2020-12-26 DIAGNOSIS — H9201 Otalgia, right ear: Secondary | ICD-10-CM

## 2020-12-26 DIAGNOSIS — H6981 Other specified disorders of Eustachian tube, right ear: Secondary | ICD-10-CM

## 2020-12-26 MED ORDER — FLUTICASONE PROPIONATE 50 MCG/ACT NA SUSP: 2.0000 | Freq: Every day | NASAL | 0 refills | Status: DC

## 2020-12-26 NOTE — Discharge Instructions (Addendum)
As we discussed, you do not have an ear infection. I believe your symptoms are due to your nasal congestion and your eustachian tubes is irritated given you the right ear pain.  I have prescribed a nasal spray.  Please use that as directed. Please see educational handouts. Tylenol or Motrin for any discomfort. If symptoms persist please see your primary care provider. If they worsen and you cannot get into the PCP please go to the ER. I provided you a work note.

## 2020-12-26 NOTE — ED Provider Notes (Signed)
[facility]    CSN: [id number] Arrival date & time: 12/26/20  0948      History   Chief Complaint Chief Complaint  Patient presents with   Otalgia    HPI Alisha Kim is a 50 y.o. female.   50 year old female who presents for evaluation of 4 days of right ear pressure and congestion.  She also think she has had a low-grade fever in the high 99's for about 3 days.  She denies any cough.  Maybe a little bit of postnasal drip.  No chest pain or shortness of breath.  She does have a history of asthma and seasonal allergies.  She did have a recent diagnosis of COVID last month.  Her primary care provider is at [facility] in [place] and could not see her today.  She does work from home.  No chest pain or shortness of breath.  No urinary or abdominal issues.  No nausea vomiting or diarrhea.  No red flag signs or symptoms elicited on history.   Past Medical History:  Diagnosis Date   Adenomyosis 09/2015   Allergy    Anemia    history of   Anxiety    controlled;    Asthma    Depression with anxiety    Diabetes mellitus without complication [facility])    was told she was pre-diabetic   Fibroids 08/14/2016   [place] March 5732   History of Helicobacter pylori infection 08/08/2016   2004   Hydrosalpinx 08/14/2016   right   Hypertension    controlled with medication;    Mixed hyperlipidemia 07/09/2020   Pre-diabetes     Patient Active Problem List   Diagnosis Date Noted   Mixed hyperlipidemia 07/09/2020   Current moderate episode of major depressive disorder ([place]) 02/17/2020   Grief reaction 02/17/2020   Stress reaction 02/17/2020   Morbid obesity ([place]) 05/31/2018   Colon cancer screening    Allergic rhinitis 03/01/2018   Cervical stenosis of spinal canal 08/28/2017   S/P vaginal hysterectomy 20/25/4270   History of Helicobacter pylori infection 08/08/2016   Microcytosis 08/01/2016   Heel spur, left 04/07/2016   Bilateral foot pain 04/04/2016   Anxiety  04/04/2016   Hypertension, goal below 140/90 07/24/2015   Bilateral hip pain 11/17/2014   Atopic dermatitis 11/17/2014   Low serum vitamin D 11/17/2014   Pre-diabetes 11/17/2014   Depression with anxiety 11/17/2014   Mild persistent asthma with allergic rhinitis without complication 62/37/6283    Past Surgical History:  Procedure Laterality Date   ABDOMINAL HYSTERECTOMY     COLONOSCOPY WITH PROPOFOL N/A 05/17/2018   Procedure: COLONOSCOPY WITH PROPOFOL;  Surgeon: Lin Landsman, MD;  Location: [facility] ENDOSCOPY;  Service: Gastroenterology;  Laterality: N/A;   DENTAL SURGERY     NO PAST SURGERIES     TONSILLECTOMY     VAGINAL HYSTERECTOMY Bilateral 09/29/2016   Procedure: HYSTERECTOMY VAGINAL WITH BILATERAL SALPINGECTOMY;  Surgeon: [name], MD;  Location: [facility] ORS;  Service: Gynecology;  Laterality: Bilateral;    OB History     Gravida  1   Para  1   Term  1   Preterm      AB      Living  1      SAB      IAB      Ectopic      Multiple      Live Births  1            Home  Medications    Prior to Admission medications   Medication Sig Start Date End Date Taking? Authorizing Provider  albuterol (PROAIR HFA) 108 (90 Base) MCG/ACT inhaler Inhale 1-2 puffs into the lungs every 4 (four) hours as needed for wheezing or shortness of breath. 07/09/20  Yes Delsa Grana, PA-C  amLODipine (NORVASC) 5 MG tablet Take 1 tablet (5 mg total) by mouth every evening. 07/09/20  Yes Tapia, Leisa, PA-C  benzonatate (TESSALON) 100 MG capsule Take 2 capsules (200 mg total) by mouth every 8 (eight) hours. 11/19/20  Yes Margarette Canada, NP  escitalopram (LEXAPRO) 10 MG tablet Take 1 tablet (10 mg total) by mouth daily. 07/09/20  Yes Delsa Grana, PA-C  fluticasone (FLONASE) 50 MCG/ACT nasal spray Place 2 sprays into both nostrils daily. 12/26/20  Yes Verda Cumins, MD  hydrOXYzine (ATARAX/VISTARIL) 10 MG tablet Take 1-2 tablets (10-20 mg total) by mouth every 6 (six) hours as needed  for itching. 07/09/20  Yes Delsa Grana, PA-C  ipratropium (ATROVENT) 0.06 % nasal spray Place 2 sprays into both nostrils 4 (four) times daily. 11/19/20  Yes Margarette Canada, NP  montelukast (SINGULAIR) 10 MG tablet TAKE 1 TABLET BY MOUTH EVERYDAY AT BEDTIME 10/01/20  Yes Tapia, Leisa, PA-C  PAZEO 0.7 % SOLN Place 2 drops into both eyes as needed. 08/28/17  Yes [provider]  promethazine-dextromethorphan (PROMETHAZINE-DM) 6.25-15 MG/5ML syrup Take 5 mLs by mouth 4 (four) times daily as needed. 11/19/20  Yes Margarette Canada, NP  umeclidinium-vilanterol (ANORO ELLIPTA) 62.5-25 MCG/INH AEPB Inhale 1 puff into the lungs daily. 07/09/20  Yes Delsa Grana, PA-C    Family History Family History  Problem Relation Age of Onset   Alcohol abuse Mother    Arthritis Mother    Asthma Mother    Depression Mother    Drug abuse Mother    Hypertension Mother    Mental illness Mother    Cancer Mother        breast and lung   Diabetes Mother    Breast cancer Mother 55   Alcohol abuse Father    Drug abuse Father    Cancer Father        lung   Hypertension Brother    Hearing loss Maternal Aunt    Hypertension Maternal Aunt    Stroke Maternal Aunt    Alzheimer's disease Maternal Aunt    Breast cancer Maternal Aunt        mat great aunt   Cancer Maternal Grandmother        cervical   Heart disease Maternal Grandmother    Hypertension Maternal Grandmother     Social History Social History   Tobacco Use   Smoking status: Never   Smokeless tobacco: Never  Vaping Use   Vaping Use: Never used  Substance Use Topics   Alcohol use: Not Currently    Alcohol/week: 0.0 standard drinks   Drug use: No     Allergies   Meloxicam, Zithromax [azithromycin], Pseudoephedrine-guaifenesin, Shellfish allergy, and Shellfish-derived products   Review of Systems Review of Systems  Constitutional:  Positive for fever. Negative for activity change, appetite change, chills, diaphoresis and fatigue.  HENT:   Positive for congestion, ear pain and postnasal drip. Negative for ear discharge, rhinorrhea, sinus pressure, sinus pain, sneezing and sore throat.   Eyes:  Negative for pain.  Respiratory:  Negative for cough, chest tightness, shortness of breath and wheezing.   Cardiovascular:  Negative for chest pain and palpitations.  Gastrointestinal:  Negative for abdominal pain,  diarrhea, nausea and vomiting.  Genitourinary:  Negative for dysuria.  Musculoskeletal:  Negative for back pain, myalgias and neck pain.  Skin:  Negative for color change, pallor, rash and wound.  Neurological:  Negative for dizziness, light-headedness and headaches.  All other systems reviewed and are negative.   Physical Exam Triage Vital Signs ED Triage Vitals  Enc Vitals Group     BP 12/26/20 1016 (!) 117/92     Pulse Rate 12/26/20 1016 92     Resp 12/26/20 1016 16     Temp 12/26/20 1016 98 F (36.7 C)     Temp Source 12/26/20 1016 Oral     SpO2 12/26/20 1016 100 %     Weight 12/26/20 1015 260 lb (117.9 kg)     Height --      Head Circumference --      Peak Flow --      Pain Score 12/26/20 1015 2     Pain Loc --      Pain Edu? --      Excl. in [place]? --    No data found.  Updated Vital Signs BP (!) 117/92 (BP Location: Left Arm)   Pulse 92   Temp 98 F (36.7 C) (Oral)   Resp 16   Wt 117.9 kg   LMP 09/29/2016 Comment: partial  SpO2 100%   BMI 41.97 kg/m   Visual Acuity Right Eye Distance:   Left Eye Distance:   Bilateral Distance:    Right Eye Near:   Left Eye Near:    Bilateral Near:     Physical Exam Vitals and nursing note reviewed.  Constitutional:      General: She is not in acute distress.    Appearance: Normal appearance. She is not ill-appearing, toxic-appearing or diaphoretic.  HENT:     Head: Normocephalic and atraumatic.     Right Ear: Tympanic membrane normal.     Left Ear: Tympanic membrane normal.     Ears:     Comments: Slight fullness to the right tympanic membrane  without any infection or fluid.  Consistent with her symptoms of ear pressure.  No evidence of otitis media or otitis externa.    Nose: Congestion present. No rhinorrhea.     Right Turbinates: Swollen.     Left Turbinates: Swollen.     Mouth/Throat:     Mouth: Mucous membranes are moist.     Pharynx: No oropharyngeal exudate or posterior oropharyngeal erythema.  Eyes:     General: No scleral icterus.       Right eye: No discharge.        Left eye: No discharge.     Extraocular Movements: Extraocular movements intact.     Conjunctiva/sclera: Conjunctivae normal.     Pupils: Pupils are equal, round, and reactive to light.  Cardiovascular:     Rate and Rhythm: Normal rate and regular rhythm.     Pulses: Normal pulses.     Heart sounds: Normal heart sounds. No murmur heard.   No friction rub. No gallop.  Pulmonary:     Effort: Pulmonary effort is normal.     Breath sounds: Normal breath sounds. No stridor. No wheezing, rhonchi or rales.  Musculoskeletal:     Cervical back: Normal range of motion and neck supple.  Skin:    General: Skin is warm and dry.     Capillary Refill: Capillary refill takes less than 2 seconds.  Neurological:     General: No focal deficit present.  Mental Status: She is alert and oriented to person, place, and time.     UC Treatments / Results  Labs (all labs ordered are listed, but only abnormal results are displayed) Labs Reviewed - No data to display  EKG   Radiology No results found.  Procedures Procedures (including critical care time)  Medications Ordered in UC Medications - No data to display  Initial Impression / Assessment and Plan / UC Course  I have reviewed the triage vital signs and the nursing notes.  Pertinent labs & imaging results that were available during my care of the patient were reviewed by me and considered in my medical decision making (see chart for details).  Clinical impression: 1.  Right ear pressure without  evidence of otitis media or otitis externa 2.  Right eustachian tube dysfunction 3.  Nasal congestion  Treatment plan: 1.  The findings and treatment plan were discussed in detail with the patient.  Patient was in agreement. 2.  Indicated that she did not have an infection and no antibiotics were indicated.  She voiced verbal understanding. 3.  Went ahead and prescribed Flonase for eustachian tube dysfunction and nasal congestion. 4.  Educational handouts provided. 5.  Tylenol or Motrin for any discomfort. 6.  If symptoms persisted I want her to see her PCP. 7.  If they worsen she should go to the ER. 8.  I did provide her a work note. 9.  She was discharged in stable condition and will follow-up here as needed.    Final Clinical Impressions(s) / UC Diagnoses   Final diagnoses:  Otalgia of right ear  Eustachian tube dysfunction, right  Nasal congestion     Discharge Instructions      As we discussed, you do not have an ear infection. I believe your symptoms are due to your nasal congestion and your eustachian tubes is irritated given you the right ear pain.  I have prescribed a nasal spray.  Please use that as directed. Please see educational handouts. Tylenol or Motrin for any discomfort. If symptoms persist please see your primary care provider. If they worsen and you cannot get into the PCP please go to the ER. I provided you a work note.     ED Prescriptions     Medication Sig Dispense Auth. Provider   fluticasone (FLONASE) 50 MCG/ACT nasal spray Place 2 sprays into both nostrils daily. 15.8 mL Verda Cumins, MD      PDMP not reviewed this encounter.   Verda Cumins, MD 12/26/20 [phone]

## 2020-12-26 NOTE — ED Triage Notes (Signed)
Pt c/o right ear pain and low grade fever since monday

## 2021-01-03 ENCOUNTER — Ambulatory Visit: Payer: BC Managed Care – PPO | Admitting: Family Medicine

## 2021-01-03 DIAGNOSIS — G5603 Carpal tunnel syndrome, bilateral upper limbs: Secondary | ICD-10-CM | POA: Diagnosis not present

## 2021-01-29 ENCOUNTER — Ambulatory Visit: Payer: BC Managed Care – PPO | Admitting: Family Medicine

## 2021-02-15 ENCOUNTER — Ambulatory Visit: Payer: BC Managed Care – PPO | Admitting: Family Medicine

## 2021-02-15 DIAGNOSIS — F321 Major depressive disorder, single episode, moderate: Secondary | ICD-10-CM

## 2021-02-15 DIAGNOSIS — R7303 Prediabetes: Secondary | ICD-10-CM

## 2021-02-15 DIAGNOSIS — J4530 Mild persistent asthma, uncomplicated: Secondary | ICD-10-CM

## 2021-02-15 DIAGNOSIS — I10 Essential (primary) hypertension: Secondary | ICD-10-CM

## 2021-02-15 DIAGNOSIS — E782 Mixed hyperlipidemia: Secondary | ICD-10-CM

## 2021-02-15 DIAGNOSIS — F419 Anxiety disorder, unspecified: Secondary | ICD-10-CM

## 2021-02-20 ENCOUNTER — Other Ambulatory Visit: Payer: Self-pay

## 2021-02-20 ENCOUNTER — Ambulatory Visit (INDEPENDENT_AMBULATORY_CARE_PROVIDER_SITE_OTHER): Payer: BC Managed Care – PPO | Admitting: Family Medicine

## 2021-02-20 ENCOUNTER — Encounter: Payer: Self-pay | Admitting: Family Medicine

## 2021-02-20 VITALS — BP 124/80 | HR 98 | Temp 98.1°F | Resp 18 | Ht 66.0 in | Wt 265.8 lb

## 2021-02-20 DIAGNOSIS — Z23 Encounter for immunization: Secondary | ICD-10-CM

## 2021-02-20 DIAGNOSIS — F321 Major depressive disorder, single episode, moderate: Secondary | ICD-10-CM

## 2021-02-20 DIAGNOSIS — I10 Essential (primary) hypertension: Secondary | ICD-10-CM

## 2021-02-20 DIAGNOSIS — K754 Autoimmune hepatitis: Secondary | ICD-10-CM | POA: Insufficient documentation

## 2021-02-20 DIAGNOSIS — E782 Mixed hyperlipidemia: Secondary | ICD-10-CM

## 2021-02-20 DIAGNOSIS — M4692 Unspecified inflammatory spondylopathy, cervical region: Secondary | ICD-10-CM | POA: Insufficient documentation

## 2021-02-20 DIAGNOSIS — R7303 Prediabetes: Secondary | ICD-10-CM

## 2021-02-20 DIAGNOSIS — J4530 Mild persistent asthma, uncomplicated: Secondary | ICD-10-CM | POA: Diagnosis not present

## 2021-02-20 DIAGNOSIS — F419 Anxiety disorder, unspecified: Secondary | ICD-10-CM

## 2021-02-20 MED ORDER — METFORMIN HCL ER 500 MG PO TB24: 500.0000 mg | ORAL_TABLET | Freq: Every day | ORAL | 0 refills | Status: DC

## 2021-02-20 MED ORDER — ALBUTEROL SULFATE HFA 108 (90 BASE) MCG/ACT IN AERS: 1.0000 | INHALATION_SPRAY | RESPIRATORY_TRACT | 5 refills | Status: DC | PRN

## 2021-02-20 NOTE — Progress Notes (Signed)
   SUBJECTIVE:   CHIEF COMPLAINT / HPI:   Hypertension: - Medications: amlodipine - Compliance: good - Checking BP at home: yes, in "normal range" - Denies any LE edema, medication SEs, or symptoms of hypotension.  Prediabetes - Last A1c 6.2 06/2020 - Mom with diabetes. - Medications: none - Compliance: n/a - Diet: chicken, fish, pork. - Exercise: no formal exercise. - Denies symptoms of hypoglycemia, polyuria, polydipsia  Asthma - Medications: albuterol PRN, anoro, singulair. Previously on flovent. - Taking: not taking anoro, last used about a month ago. Hasn't used albuterol in several months.  - Common triggers: environmental/seasonal allergies, respiratory illnesses - ED visits/hospitalization in the last 6 months: COVID 11/2020 - Current symptoms: none  Anxiety/Depression - Medications: lexapro - Taking: good compliance - Counseling: yes - Previous hospitalizations: none - FH of psych illness: mom with schizophrenia - Symptoms: none - Current stressors: work stress - Coping Mechanisms: napping, crafting.   OBJECTIVE:   BP 124/80   Pulse 98   Temp 98.1 F (36.7 C) (Oral)   Resp 18   Ht '5\' 6"'$  (1.676 m)   Wt 265 lb 12.8 oz (120.6 kg)   LMP 09/29/2016 Comment: partial  SpO2 98%   BMI 42.90 kg/m   Gen: well appearing, in NAD Card: Reg rate Lungs: Comfortable WOB on RA Ext: WWP    ASSESSMENT/PLAN:   Hypertension, goal below 140/90 Doing well on current regimen, no changes made today. Obtaining labs.  Mild persistent asthma with allergic rhinitis without complication Doing well without maintenance inhaler, continue to monitor.  Pre-diabetes Recheck a1c today. Start metformin to aid in insulin sensitivity and weight loss.  Morbid obesity ([place]) Contributing to prediabetes, HTN. Recommend weight loss through diet and exercise. Discussed weight management clinic, information provided. Start metformin to aid in insulin sensitivity.  Mixed  hyperlipidemia Recheck labs today.  Anxiety Doing well on current regimen, no changes made today.  Current moderate episode of major depressive disorder ([place]) Doing well on current regimen, no changes made today.     Myles Gip, DO

## 2021-02-20 NOTE — Assessment & Plan Note (Signed)
Doing well on current regimen, no changes made today. 

## 2021-02-20 NOTE — Assessment & Plan Note (Signed)
Doing well without maintenance inhaler, continue to monitor.

## 2021-02-20 NOTE — Patient Instructions (Addendum)
It was great to see you!  Our plans for today:  - We are starting metformin to help with weight and prediabetes.  - We sent refill of albuterol to the pharmacy.  - There is a medical weight management clinic in [place]. Visit [email or web address] for more information.  - Come back in 3 months.   We are checking some labs today, we will release these results to your MyChart.  Take care and seek immediate care sooner if you develop any concerns.   Dr. Ky Barban  Here is an example of what a healthy plate looks like:    ? Make half your plate fruits and vegetables.     ? Focus on whole fruits.     ? Vary your veggies.  ? Make half your grains whole grains. -     ? Look for the word "whole" at the beginning of the ingredients list    ? Some whole-grain ingredients include whole oats, whole-wheat flour,        whole-grain corn, whole-grain brown rice, and whole rye.  ? Move to low-fat and fat-free milk or yogurt.  ? Vary your protein routine. - Meat, fish, poultry (chicken, [place]), eggs, beans (kidney, pinto), dairy.  ? Drink and eat less sodium, saturated fat, and added sugars.  Look for opportunities to move your body throughout your day:  Never lie down when you can sit; never sit when you can stand; never stand when you can pace.  Moving your body throughout the day is just as important as the 30 or 60 minutes of exercise at the gym!  Get social Get active with your friends instead of going out to eat. Go for a hike, walk around the mall, or play an exercise-themed video game.   Move more at work Fit more activity into the workday. Stand during phone calls, use a printer farther from your desk, and get up to stretch each hour.    Do something new Develop a new skill to kick-start your motivation. Sign up for a class to learn how to [employer], surf, do tai chi, or play a sport.    Keep cool in the pool Don't like to sweat? Hit the  local community pool for a swim, water polo, or water aerobics class to stay cool while exercising.    Stay on track Use a fitness tracker (FITBIT, Fitness Pal mobile app) to track your activity and provide motivation to reach your goals.

## 2021-02-20 NOTE — Assessment & Plan Note (Signed)
Doing well on current regimen, no changes made today. Obtaining labs. 

## 2021-02-20 NOTE — Assessment & Plan Note (Signed)
Recheck labs today. 

## 2021-02-20 NOTE — Assessment & Plan Note (Signed)
Recheck a1c today. Start metformin to aid in insulin sensitivity and weight loss.

## 2021-02-20 NOTE — Assessment & Plan Note (Signed)
Contributing to prediabetes, HTN. Recommend weight loss through diet and exercise. Discussed weight management clinic, information provided. Start metformin to aid in insulin sensitivity.

## 2021-02-21 LAB — BASIC METABOLIC PANEL
BUN: 10 mg/dL (ref 7–25)
CO2: 31 mmol/L (ref 20–32)
Calcium: 9.4 mg/dL (ref 8.6–10.2)
Chloride: 103 mmol/L (ref 98–110)
Creat: 0.67 mg/dL (ref 0.50–0.99)
Glucose, Bld: 120 mg/dL — ABNORMAL HIGH (ref 65–99)
Potassium: 3.9 mmol/L (ref 3.5–5.3)
Sodium: 139 mmol/L (ref 135–146)

## 2021-02-21 LAB — LIPID PANEL
Cholesterol: 214 mg/dL — ABNORMAL HIGH (ref <200)
HDL: 42 mg/dL — ABNORMAL LOW (ref >=50)
LDL Cholesterol (Calc): 156 mg/dL (calc) — ABNORMAL HIGH
Non-HDL Cholesterol (Calc): 172 mg/dL (calc) — ABNORMAL HIGH (ref <130)
Total CHOL/HDL Ratio: 5.1 (calc) — ABNORMAL HIGH (ref <5.0)
Triglycerides: 69 mg/dL (ref <150)

## 2021-02-21 LAB — HEMOGLOBIN A1C
Hgb A1c MFr Bld: 6.5 % of total Hgb — ABNORMAL HIGH (ref <5.7)
Mean Plasma Glucose: 140 mg/dL
eAG (mmol/L): 7.7 mmol/L

## 2021-04-06 ENCOUNTER — Other Ambulatory Visit: Payer: Self-pay | Admitting: Family Medicine

## 2021-04-06 DIAGNOSIS — J4530 Mild persistent asthma, uncomplicated: Secondary | ICD-10-CM

## 2021-04-06 NOTE — Telephone Encounter (Signed)
Requested Prescriptions  Pending Prescriptions Disp Refills  . montelukast (SINGULAIR) 10 MG tablet [Pharmacy Med Name: MONTELUKAST SOD 10 MG TABLET] 90 tablet 0    Sig: TAKE 1 TABLET BY MOUTH EVERYDAY AT BEDTIME     Pulmonology:  Leukotriene Inhibitors Passed - 04/06/2021 10:53 AM      Passed - Valid encounter within last 12 months    Recent Outpatient Visits          1 month ago Mild persistent asthma with allergic rhinitis without complication   [facility] Myles Gip, DO   4 months ago Bilateral carpal tunnel syndrome   [facility] Kathrine Haddock, NP   9 months ago Low serum vitamin D   [facility] [place], [name], PA-C   1 year ago Current moderate episode of major depressive disorder, unspecified whether recurrent [facility])   [facility] Delsa Grana, PA-C   1 year ago Current moderate episode of major depressive disorder, unspecified whether recurrent [facility])   [facility] Delsa Grana, PA-C      Future Appointments            In 1 month Delsa Grana, PA-C [facility], [facility]

## 2021-04-07 ENCOUNTER — Other Ambulatory Visit: Payer: Self-pay | Admitting: Family Medicine

## 2021-04-07 DIAGNOSIS — F419 Anxiety disorder, unspecified: Secondary | ICD-10-CM

## 2021-04-07 DIAGNOSIS — F321 Major depressive disorder, single episode, moderate: Secondary | ICD-10-CM

## 2021-04-07 NOTE — Telephone Encounter (Signed)
last RF 07/09/20 #90 3 RF

## 2021-04-11 ENCOUNTER — Ambulatory Visit
Admission: EM | Admit: 2021-04-11 | Discharge: 2021-04-11 | Disposition: A | Discharge status: Home / Self Care | Payer: BC Managed Care – PPO | Attending: Emergency Medicine | Admitting: Emergency Medicine

## 2021-04-11 DIAGNOSIS — M25562 Pain in left knee: Secondary | ICD-10-CM | POA: Diagnosis not present

## 2021-04-11 MED ORDER — PREDNISONE 20 MG PO TABS: 40.0000 mg | ORAL_TABLET | Freq: Every day | ORAL | 0 refills | Status: DC

## 2021-04-11 MED ORDER — NAPROXEN 500 MG PO TABS: 500.0000 mg | ORAL_TABLET | Freq: Two times a day (BID) | ORAL | 0 refills | Status: DC

## 2021-04-11 NOTE — ED Provider Notes (Signed)
[facility]    CSN: [id number] Arrival date & time: 04/11/21  1743      History   Chief Complaint Chief Complaint  Patient presents with   Knee Pain    HPI Alisha Kim is a 50 y.o. female.   Patient presents with left knee pain for 2 weeks,.  Pain can be felt when bearing weight causing patient's to mildly limp.  Symptoms worse with bending of the knee.  Pain radiates down to mid lower leg into mid upper leg.  Feels as if her kneecap Is slipping out of place.  Denies numbness, tingling, precipitating event, prior injury or trauma.  Patient does endorse that she has been attempting to increase her exercise by walking more frequently, patient has been exercising UGG boots.  Endorses she recently tripped over a laundry basket but did not fall.  Has attempted use of over-the-counter Tylenol with no improvement.  History of anemia, asthma, depression, anxiety, diabetes mellitus, hypertension.  Past Medical History:  Diagnosis Date   Adenomyosis 09/2015   Allergy    Anemia    history of   Anxiety    controlled;    Asthma    Depression with anxiety    Diabetes mellitus without complication [facility])    was told she was pre-diabetic   Fibroids 08/14/2016   [place] March 8466   History of Helicobacter pylori infection 08/08/2016   2004   Hydrosalpinx 08/14/2016   right   Hypertension    controlled with medication;    Mixed hyperlipidemia 07/09/2020   Pre-diabetes     Patient Active Problem List   Diagnosis Date Noted   Mixed hyperlipidemia 07/09/2020   Current moderate episode of major depressive disorder ([place]) 02/17/2020   Grief reaction 02/17/2020   Stress reaction 02/17/2020   Morbid obesity ([place]) 05/31/2018   Colon cancer screening    Allergic rhinitis 03/01/2018   Cervical stenosis of spinal canal 08/28/2017   S/P vaginal hysterectomy 59/93/5701   History of Helicobacter pylori infection 08/08/2016   Microcytosis 08/01/2016   Heel spur, left 04/07/2016    Bilateral foot pain 04/04/2016   Anxiety 04/04/2016   Hypertension, goal below 140/90 07/24/2015   Bilateral hip pain 11/17/2014   Atopic dermatitis 11/17/2014   Low serum vitamin D 11/17/2014   Pre-diabetes 11/17/2014   Depression with anxiety 11/17/2014   Mild persistent asthma with allergic rhinitis without complication 77/93/9030    Past Surgical History:  Procedure Laterality Date   ABDOMINAL HYSTERECTOMY     COLONOSCOPY WITH PROPOFOL N/A 05/17/2018   Procedure: COLONOSCOPY WITH PROPOFOL;  Surgeon: Lin Landsman, MD;  Location: [facility] ENDOSCOPY;  Service: Gastroenterology;  Laterality: N/A;   DENTAL SURGERY     NO PAST SURGERIES     TONSILLECTOMY     VAGINAL HYSTERECTOMY Bilateral 09/29/2016   Procedure: HYSTERECTOMY VAGINAL WITH BILATERAL SALPINGECTOMY;  Surgeon: [name], MD;  Location: [facility] ORS;  Service: Gynecology;  Laterality: Bilateral;    OB History     Gravida  1   Para  1   Term  1   Preterm      AB      Living  1      SAB      IAB      Ectopic      Multiple      Live Births  1            Home Medications    Prior to Admission medications  Medication Sig Start Date End Date Taking? Authorizing Provider  albuterol (PROAIR HFA) 108 (90 Base) MCG/ACT inhaler Inhale 1-2 puffs into the lungs every 4 (four) hours as needed for wheezing or shortness of breath. 02/20/21  Yes Myles Gip, DO  amLODipine (NORVASC) 5 MG tablet Take 1 tablet (5 mg total) by mouth every evening. 07/09/20  Yes Tapia, Leisa, PA-C  escitalopram (LEXAPRO) 10 MG tablet Take 1 tablet (10 mg total) by mouth daily. 07/09/20  Yes Delsa Grana, PA-C  hydrOXYzine (ATARAX/VISTARIL) 10 MG tablet Take 1-2 tablets (10-20 mg total) by mouth every 6 (six) hours as needed for itching. 07/09/20  Yes Delsa Grana, PA-C  metFORMIN (GLUCOPHAGE XR) 500 MG 24 hr tablet Take 1 tablet (500 mg total) by mouth daily with breakfast. 02/20/21  Yes Rumball, Jake Church, DO  naproxen (NAPROSYN)  500 MG tablet Take 1 tablet (500 mg total) by mouth 2 (two) times daily. 04/11/21  Yes Marjie Chea R, NP  predniSONE (DELTASONE) 20 MG tablet Take 2 tablets (40 mg total) by mouth daily. 04/11/21  Yes Vonzella Althaus R, NP  umeclidinium-vilanterol (ANORO ELLIPTA) 62.5-25 MCG/INH AEPB Inhale 1 puff into the lungs daily. 07/09/20  Yes Delsa Grana, PA-C  benzonatate (TESSALON) 100 MG capsule Take 2 capsules (200 mg total) by mouth every 8 (eight) hours. Patient not taking: Reported on 02/20/2021 11/19/20   Margarette Canada, NP  fluticasone [facility]) 50 MCG/ACT nasal spray Place 2 sprays into both nostrils daily. 12/26/20   Verda Cumins, MD  ipratropium (ATROVENT) 0.06 % nasal spray Place 2 sprays into both nostrils 4 (four) times daily. Patient not taking: Reported on 02/20/2021 11/19/20   Margarette Canada, NP  montelukast (SINGULAIR) 10 MG tablet TAKE 1 TABLET BY MOUTH EVERYDAY AT BEDTIME 04/06/21   Delsa Grana, PA-C  PAZEO 0.7 % SOLN Place 2 drops into both eyes as needed. Patient not taking: Reported on 02/20/2021 08/28/17   [provider]  promethazine-dextromethorphan (PROMETHAZINE-DM) 6.25-15 MG/5ML syrup Take 5 mLs by mouth 4 (four) times daily as needed. Patient not taking: Reported on 02/20/2021 11/19/20   Margarette Canada, NP    Family History Family History  Problem Relation Age of Onset   Alcohol abuse Mother    Arthritis Mother    Asthma Mother    Depression Mother    Drug abuse Mother    Hypertension Mother    Mental illness Mother    Cancer Mother        breast and lung   Diabetes Mother    Breast cancer Mother 41   Alcohol abuse Father    Drug abuse Father    Cancer Father        lung   Hypertension Brother    Hearing loss Maternal Aunt    Hypertension Maternal Aunt    Stroke Maternal Aunt    Alzheimer's disease Maternal Aunt    Breast cancer Maternal Aunt        mat great aunt   Cancer Maternal Grandmother        cervical   Heart disease Maternal Grandmother     Hypertension Maternal Grandmother     Social History Social History   Tobacco Use   Smoking status: Never   Smokeless tobacco: Never  Vaping Use   Vaping Use: Never used  Substance Use Topics   Alcohol use: Not Currently    Alcohol/week: 0.0 standard drinks   Drug use: No     Allergies   Meloxicam, Zithromax [azithromycin], Pseudoephedrine-guaifenesin, Shellfish  allergy, and Shellfish-derived products   Review of Systems Review of Systems  Constitutional: Negative.   Respiratory: Negative.    Cardiovascular: Negative.   Musculoskeletal:  Positive for arthralgias. Negative for back pain, gait problem, joint swelling, myalgias, neck pain and neck stiffness.  Skin: Negative.   Neurological: Negative.     Physical Exam Triage Vital Signs ED Triage Vitals  Enc Vitals Group     BP 04/11/21 1912 (!) 133/91     Pulse Rate 04/11/21 1912 79     Resp 04/11/21 1912 18     Temp 04/11/21 1912 98.1 F (36.7 C)     Temp Source 04/11/21 1912 Oral     SpO2 04/11/21 1912 98 %     Weight 04/11/21 1914 265 lb (120.2 kg)     Height 04/11/21 1914 5\' 6"  (1.676 m)     Head Circumference --      Peak Flow --      Pain Score 04/11/21 1913 7     Pain Loc --      Pain Edu? --      Excl. in [place]? --    No data found.  Updated Vital Signs BP (!) 133/91 (BP Location: Left Arm)   Pulse 79   Temp 98.1 F (36.7 C) (Oral)   Resp 18   Ht 5\' 6"  (1.676 m)   Wt 265 lb (120.2 kg)   LMP 09/29/2016 Comment: partial  SpO2 98%   BMI 42.77 kg/m   Visual Acuity Right Eye Distance:   Left Eye Distance:   Bilateral Distance:    Right Eye Near:   Left Eye Near:    Bilateral Near:     Physical Exam Constitutional:      Appearance: Normal appearance. She is normal weight.  HENT:     Head: Normocephalic.  Eyes:     Extraocular Movements: Extraocular movements intact.  Pulmonary:     Effort: Pulmonary effort is normal.  Musculoskeletal:     Comments: Diffuse tenderness over the anterior  aspect of the right knee, point tenderness directly over the patellar bone, no swelling, effusion, erythema noted, range of motion is intact, 2+ popliteal pulse, no ligament laxity noted  Skin:    General: Skin is warm and dry.  Neurological:     Mental Status: She is alert and oriented to person, place, and time. Mental status is at baseline.  Psychiatric:        Mood and Affect: Mood normal.        Behavior: Behavior normal.     UC Treatments / Results  Labs (all labs ordered are listed, but only abnormal results are displayed) Labs Reviewed - No data to display  EKG   Radiology No results found.  Procedures Procedures (including critical care time)  Medications Ordered in UC Medications - No data to display  Initial Impression / Assessment and Plan / UC Course  I have reviewed the triage vital signs and the nursing notes.  Pertinent labs & imaging results that were available during my care of the patient were reviewed by me and considered in my medical decision making (see chart for details).  Acute pain of the left knee  Etiology of symptoms most likely irritation and inflammation due to overuse, small inflammation to the patellar tendon, discussed with patient, will manage conservatively at this time and defer x-ray, patient in agreement with care  1.  Prednisone 40 mg daily for 5 days 2.  Naproxen 500 mg twice  daily for 5 days then as needed after completion of steroid course, reviewed allergies with patient 3.  Recommended heat in 15-minute intervals, pillows for support, daily stretching and use of supportive shoes like sneakers while exercising 4.  Follow-up with orthopedic specialist, walking referral given Final Clinical Impressions(s) / UC Diagnoses   Final diagnoses:  Acute pain of left knee     Discharge Instructions      Starting tomorrow take prednisone every morning with food for 5 days, while using medication can use Tylenol for additional  comfort  After completion of steroid course please take naproxen twice a day for 5 days then as needed  Please wear supportive shoes like sneakers moving forward extra exercise to prevent further irritation to your leg  You can prop your leg up on pillows and place pillows underneath and between legs while lying and sleeping for additional support  You can apply heat in 15-minute intervals as needed  You can wear knee sleeves that can be purchased at most local pharmacies for additional comfort  If symptoms do not improve or worsen please follow-up with orthopedic specialist, information listed below for further evaluation and treatment   ED Prescriptions     Medication Sig Dispense Auth. Provider   predniSONE (DELTASONE) 20 MG tablet Take 2 tablets (40 mg total) by mouth daily. 10 tablet Arria Naim R, NP   naproxen (NAPROSYN) 500 MG tablet Take 1 tablet (500 mg total) by mouth 2 (two) times daily. 30 tablet Hans Eden, NP      PDMP not reviewed this encounter.   Hans Eden, NP 04/11/21 1941

## 2021-04-11 NOTE — ED Triage Notes (Signed)
Pt reports left knee pain for 2 weeks, unknown injury, did trip over a clothes basket two weeks agio, but does not remember injuring her knee at that time.  Sitting makes the knee pain, pain worse with bending, pt reports"feels like something is moving around my kneecap" "Feel things shifting in my left knee".  Pain "shoots down my leg"  No noticeable welling per pt.   Pt reports right knee cap "come out" in the past, but not left.

## 2021-04-11 NOTE — Discharge Instructions (Signed)
Starting tomorrow take prednisone every morning with food for 5 days, while using medication can use Tylenol for additional comfort  After completion of steroid course please take naproxen twice a day for 5 days then as needed  Please wear supportive shoes like sneakers moving forward extra exercise to prevent further irritation to your leg  You can prop your leg up on pillows and place pillows underneath and between legs while lying and sleeping for additional support  You can apply heat in 15-minute intervals as needed  You can wear knee sleeves that can be purchased at most local pharmacies for additional comfort  If symptoms do not improve or worsen please follow-up with orthopedic specialist, information listed below for further evaluation and treatment

## 2021-05-22 ENCOUNTER — Encounter: Payer: Self-pay | Admitting: Family Medicine

## 2021-05-22 ENCOUNTER — Other Ambulatory Visit: Payer: Self-pay

## 2021-05-22 ENCOUNTER — Ambulatory Visit (INDEPENDENT_AMBULATORY_CARE_PROVIDER_SITE_OTHER): Payer: BC Managed Care – PPO | Admitting: Family Medicine

## 2021-05-22 ENCOUNTER — Ambulatory Visit: Payer: BC Managed Care – PPO | Admitting: Family Medicine

## 2021-05-22 VITALS — BP 124/78 | HR 84 | Temp 97.9°F | Resp 16 | Ht 66.0 in | Wt 266.7 lb

## 2021-05-22 DIAGNOSIS — I10 Essential (primary) hypertension: Secondary | ICD-10-CM

## 2021-05-22 DIAGNOSIS — F419 Anxiety disorder, unspecified: Secondary | ICD-10-CM

## 2021-05-22 DIAGNOSIS — E559 Vitamin D deficiency, unspecified: Secondary | ICD-10-CM | POA: Diagnosis not present

## 2021-05-22 DIAGNOSIS — E782 Mixed hyperlipidemia: Secondary | ICD-10-CM

## 2021-05-22 DIAGNOSIS — E119 Type 2 diabetes mellitus without complications: Secondary | ICD-10-CM

## 2021-05-22 DIAGNOSIS — Z5181 Encounter for therapeutic drug level monitoring: Secondary | ICD-10-CM

## 2021-05-22 DIAGNOSIS — J4530 Mild persistent asthma, uncomplicated: Secondary | ICD-10-CM

## 2021-05-22 DIAGNOSIS — R748 Abnormal levels of other serum enzymes: Secondary | ICD-10-CM | POA: Insufficient documentation

## 2021-05-22 MED ORDER — UMECLIDINIUM-VILANTEROL 62.5-25 MCG/ACT IN AEPB: 1.0000 | INHALATION_SPRAY | Freq: Every day | RESPIRATORY_TRACT | 5 refills | Status: DC

## 2021-05-22 MED ORDER — MONTELUKAST SODIUM 10 MG PO TABS: ORAL_TABLET | ORAL | 1 refills | Status: DC

## 2021-05-22 NOTE — Patient Instructions (Signed)
Health Maintenance  Topic Date Due   Complete foot exam   Never done   Pneumococcal Vaccination (2 - PCV) 03/02/2019   COVID-19 Vaccine (3 - Booster for Pfizer series) 09/06/2019   Pap Smear  11/12/2019   Zoster (Shingles) Vaccine (1 of 2) Never done   Mammogram  07/25/2021   Eye exam for diabetics  08/01/2021   Hemoglobin A1C  08/20/2021   Tetanus Vaccine  06/09/2024   Colon Cancer Screening  05/17/2028   Flu Shot  Completed   Hepatitis C Screening: USPSTF Recommendation to screen - Ages 18-79 yo.  Completed   HIV Screening  Completed   HPV Vaccine  Aged Out      Diabetes Mellitus Basics Diabetes mellitus, or diabetes, is a long-term (chronic) disease. It occurs when the body does not properly use sugar (glucose) that is released from food after you eat. Diabetes mellitus may be caused by one or both of these problems: Your pancreas does not make enough of a hormone called insulin. Your body does not react in a normal way to the insulin that it makes. Insulin lets glucose enter cells in your body. This gives you energy. If you have diabetes, glucose cannot get into cells. This causes high blood glucose (hyperglycemia). How to treat and manage diabetes You may need to take insulin or other diabetes medicines daily to keep your glucose in balance. If you are prescribed insulin, you will learn how to give yourself insulin by injection. You may need to adjust the amount of insulin you take based on the foods that you eat. You will need to check your blood glucose levels using a glucose monitor as told by your health care provider. The readings can help determine if you have low or high blood glucose. Generally, you should have these blood glucose levels: Before meals (preprandial): 80-130 mg/dL (4.4-7.2 mmol/L). After meals (postprandial): below 180 mg/dL (10 mmol/L). Hemoglobin A1c (HbA1c) level: less than 7%. Your health care provider will set treatment goals for you. Keep all  follow-up visits. This is important. Follow these instructions at home: Diabetes medicines Take your diabetes medicines every day as told by your health care provider. List your diabetes medicines here: Name of medicine: ______________________________ Amount (dose): _______________ Time (a.m./p.m.): _______________ Notes: ___________________________________ Name of medicine: ______________________________ Amount (dose): _______________ Time (a.m./p.m.): _______________ Notes: ___________________________________ Name of medicine: ______________________________ Amount (dose): _______________ Time (a.m./p.m.): _______________ Notes: ___________________________________ Insulin If you use insulin, list the types of insulin you use here: Insulin type: ______________________________ Amount (dose): _______________ Time (a.m./p.m.): _______________Notes: ___________________________________ Insulin type: ______________________________ Amount (dose): _______________ Time (a.m./p.m.): _______________ Notes: ___________________________________ Insulin type: ______________________________ Amount (dose): _______________ Time (a.m./p.m.): _______________ Notes: ___________________________________ Insulin type: ______________________________ Amount (dose): _______________ Time (a.m./p.m.): _______________ Notes: ___________________________________ Insulin type: ______________________________ Amount (dose): _______________ Time (a.m./p.m.): _______________ Notes: ___________________________________ Managing blood glucose Check your blood glucose levels using a glucose monitor as told by your health care provider. Write down the times that you check your glucose levels here: Time: _______________ Notes: ___________________________________ Time: _______________ Notes: ___________________________________ Time: _______________ Notes: ___________________________________ Time: _______________ Notes:  ___________________________________ Time: _______________ Notes: ___________________________________ Time: _______________ Notes: ___________________________________  Low blood glucose Low blood glucose (hypoglycemia) is when glucose is at or below 70 mg/dL (3.9 mmol/L). Symptoms may include: Feeling: Hungry. Sweaty and clammy. Irritable or easily upset. Dizzy. Sleepy. Having: A fast heartbeat. A headache. A change in your vision. Numbness around the mouth, lips, or tongue. Having trouble with: Moving (coordination). Sleeping. Treating low blood glucose To treat  low blood glucose, eat or drink something containing sugar right away. If you can think clearly and swallow safely, follow the 15:15 rule: Take 15 grams of a fast-acting carb (carbohydrate), as told by your health care provider. Some fast-acting carbs are: Glucose tablets: take 3-4 tablets. Hard candy: eat 3-5 pieces. Fruit juice: drink 4 oz (120 mL). Regular (not diet) soda: drink 4-6 oz (120-180 mL). Honey or sugar: eat 1 Tbsp (15 mL). Check your blood glucose levels 15 minutes after you take the carb. If your glucose is still at or below 70 mg/dL (3.9 mmol/L), take 15 grams of a carb again. If your glucose does not go above 70 mg/dL (3.9 mmol/L) after 3 tries, get help right away. After your glucose goes back to normal, eat a meal or a snack within 1 hour. Treating very low blood glucose If your glucose is at or below 54 mg/dL (3 mmol/L), you have very low blood glucose (severe hypoglycemia). This is an emergency. Do not wait to see if the symptoms will go away. Get medical help right away. Call your local emergency services (911 in the [place] Do not drive yourself to the hospital. Questions to ask your health care provider Should I talk with a diabetes educator? What equipment will I need to care for myself at home? What diabetes medicines do I need? When should I take them? How often do I need to check my blood  glucose levels? What number can I call if I have questions? When is my follow-up visit? Where can I find a support group for people with diabetes? Where to find more information American Diabetes Association: www.diabetes.org Association of Diabetes Care and Education Specialists: www.diabeteseducator.org Contact a health care provider if: Your blood glucose is at or above 240 mg/dL (13.3 mmol/L) for 2 days in a row. You have been sick or have had a fever for 2 days or more, and you are not getting better. You have any of these problems for more than 6 hours: You cannot eat or drink. You feel nauseous. You vomit. You have diarrhea. Get help right away if: Your blood glucose is lower than 54 mg/dL (3 mmol/L). You get confused. You have trouble thinking clearly. You have trouble breathing. These symptoms may represent a serious problem that is an emergency. Do not wait to see if the symptoms will go away. Get medical help right away. Call your local emergency services (911 in the [place] Do not drive yourself to the hospital. Summary Diabetes mellitus is a chronic disease that occurs when the body does not properly use sugar (glucose) that is released from food after you eat. Take insulin and diabetes medicines as told. Check your blood glucose every day, as often as told. Keep all follow-up visits. This is important. This information is not intended to replace advice given to you by your health care provider. Make sure you discuss any questions you have with your health care provider. Document Revised: 09/27/2019 Document Reviewed: 09/27/2019 Elsevier Patient Education  [place].

## 2021-05-22 NOTE — Progress Notes (Signed)
Name: Alisha Kim   MRN: [id number]    DOB: 12-05-1970   Date:05/22/2021       Progress Note  Chief Complaint  Patient presents with   Follow-up   Hypertension   Hyperlipidemia   Anxiety   PreDM     Subjective:   Alisha Kim is a 50 y.o. female, presents to clinic for routine f/up  HTN on norvasc BP Readings from Last 3 Encounters:  05/22/21 124/78  04/11/21 (!) 133/91  02/20/21 124/80   DM:   New onset DM as of 9/14, on metformin 500 mg once daily  Denies: Polyuria, polydipsia, vision changes, neuropathy, hypoglycemia Recent pertinent labs: Lab Results  Component Value Date   HGBA1C 6.5 (H) 02/20/2021   HGBA1C 6.2 (H) 07/09/2020   HGBA1C 6.3 (H) 03/19/2020   Lab Results  Component Value Date   LDLCALC 156 (H) 02/20/2021   CREATININE 0.67 02/20/2021   Standard of care and health maintenance: Urine Microalbumin:  due today Foot exam:  due DM eye exam:  due ACEI/ARB:  not taking - will need to get her on Statin:  not taking - discussed today  HLD - not on meds but statin is indicated Lab Results  Component Value Date   CHOL 214 (H) 02/20/2021   HDL 42 (L) 02/20/2021   LDLCALC 156 (H) 02/20/2021   TRIG 69 02/20/2021   CHOLHDL 5.1 (H) 02/20/2021     Anxiety/MDD on lexapro 10 mg Depression screen [facility] 2/9 05/22/2021 02/20/2021 11/08/2020  Decreased Interest 0 0 0  Down, Depressed, Hopeless 0 0 0  PHQ - 2 Score 0 0 0  Altered sleeping 2 - 0  Tired, decreased energy 0 - 0  Change in appetite 0 - 0  Feeling bad or failure about yourself  0 - 0  Trouble concentrating 0 - 0  Moving slowly or fidgety/restless 0 - 0  Suicidal thoughts 0 - 0  PHQ-9 Score 2 - 0  Difficult doing work/chores Not difficult at all - Not difficult at all  Some recent data might be hidden   GAD 7 : Generalized Anxiety Score 05/22/2021 11/08/2020 07/09/2020 02/17/2020  Nervous, Anxious, on Edge 0 0 0 3  Control/stop worrying 0 0 0 2  Worry too much - different things 0 0 0 1   Trouble relaxing 0 0 0 1  Restless 0 0 0 1  Easily annoyed or irritable 0 0 0 1  Afraid - awful might happen 0 0 0 1  Total GAD 7 Score 0 0 0 10  Anxiety Difficulty Not difficult at all Not difficult at all Not difficult at all Somewhat difficult   Hx of fatty liver disease   Current Outpatient Medications:    albuterol (PROAIR HFA) 108 (90 Base) MCG/ACT inhaler, Inhale 1-2 puffs into the lungs every 4 (four) hours as needed for wheezing or shortness of breath., Disp: 1 each, Rfl: 5   amLODipine (NORVASC) 5 MG tablet, Take 1 tablet (5 mg total) by mouth every evening., Disp: 90 tablet, Rfl: 3   escitalopram (LEXAPRO) 10 MG tablet, Take 1 tablet (10 mg total) by mouth daily., Disp: 90 tablet, Rfl: 3   hydrOXYzine (ATARAX/VISTARIL) 10 MG tablet, Take 1-2 tablets (10-20 mg total) by mouth every 6 (six) hours as needed for itching., Disp: 120 tablet, Rfl: 2   metFORMIN (GLUCOPHAGE XR) 500 MG 24 hr tablet, Take 1 tablet (500 mg total) by mouth daily with breakfast., Disp: 90 tablet, Rfl: 0  benzonatate (TESSALON) 100 MG capsule, Take 2 capsules (200 mg total) by mouth every 8 (eight) hours. (Patient not taking: Reported on 02/20/2021), Disp: 21 capsule, Rfl: 0   fluticasone (FLONASE) 50 MCG/ACT nasal spray, Place 2 sprays into both nostrils daily. (Patient not taking: Reported on 05/22/2021), Disp: 15.8 mL, Rfl: 0   ipratropium (ATROVENT) 0.06 % nasal spray, Place 2 sprays into both nostrils 4 (four) times daily. (Patient not taking: Reported on 02/20/2021), Disp: 15 mL, Rfl: 12   montelukast (SINGULAIR) 10 MG tablet, TAKE 1 TABLET BY MOUTH EVERYDAY AT BEDTIME (Patient not taking: Reported on 05/22/2021), Disp: 90 tablet, Rfl: 0   naproxen (NAPROSYN) 500 MG tablet, Take 1 tablet (500 mg total) by mouth 2 (two) times daily. (Patient not taking: Reported on 05/22/2021), Disp: 30 tablet, Rfl: 0   PAZEO 0.7 % SOLN, Place 2 drops into both eyes as needed. (Patient not taking: Reported on 05/22/2021),  Disp: , Rfl: 2   predniSONE (DELTASONE) 20 MG tablet, Take 2 tablets (40 mg total) by mouth daily. (Patient not taking: Reported on 05/22/2021), Disp: 10 tablet, Rfl: 0   promethazine-dextromethorphan (PROMETHAZINE-DM) 6.25-15 MG/5ML syrup, Take 5 mLs by mouth 4 (four) times daily as needed. (Patient not taking: Reported on 05/22/2021), Disp: 118 mL, Rfl: 0   umeclidinium-vilanterol (ANORO ELLIPTA) 62.5-25 MCG/INH AEPB, Inhale 1 puff into the lungs daily. (Patient not taking: Reported on 05/22/2021), Disp: 59 each, Rfl: 5  Patient Active Problem List   Diagnosis Date Noted   Mixed hyperlipidemia 07/09/2020   Current moderate episode of major depressive disorder ([place]) 02/17/2020   Grief reaction 02/17/2020   Stress reaction 02/17/2020   Morbid obesity ([place]) 05/31/2018   Colon cancer screening    Allergic rhinitis 03/01/2018   Cervical stenosis of spinal canal 08/28/2017   S/P vaginal hysterectomy 47/02/6282   History of Helicobacter pylori infection 08/08/2016   Microcytosis 08/01/2016   Heel spur, left 04/07/2016   Bilateral foot pain 04/04/2016   Anxiety 04/04/2016   Hypertension, goal below 140/90 07/24/2015   Bilateral hip pain 11/17/2014   Atopic dermatitis 11/17/2014   Low serum vitamin D 11/17/2014   Pre-diabetes 11/17/2014   Depression with anxiety 11/17/2014   Mild persistent asthma with allergic rhinitis without complication 66/29/4765    Past Surgical History:  Procedure Laterality Date   ABDOMINAL HYSTERECTOMY     COLONOSCOPY WITH PROPOFOL N/A 05/17/2018   Procedure: COLONOSCOPY WITH PROPOFOL;  Surgeon: Lin Landsman, MD;  Location[facility] END[facility]COPY;  Service: Gastroenterology;  Laterality: N/A;   DENTAL SURGERY     NO PAST SURGERIES     TONSILLECTOMY     VAGINAL HYSTERECTOMY Bilateral 09/29/2016   Procedure: HYSTERECTOMY VAGINAL WITH BILATERAL SALPINGECTOMY;  Surgeon: [name], MD;  Location: [facility] ORS;  Service: Gynecology;  Laterality: Bilateral;    Family  History  Problem Relation Age of Onset   Alcohol abuse Mother    Arthritis Mother    Asthma Mother    Depression Mother    Drug abuse Mother    Hypertension Mother    Mental illness Mother    Cancer Mother        breast and lung   Diabetes Mother    Breast cancer Mother 24   Alcohol abuse Father    Drug abuse Father    Cancer Father        lung   Hypertension Brother    Hearing loss Maternal Aunt    Hypertension Maternal Aunt    Stroke Maternal  Aunt    Alzheimer's disease Maternal Aunt    Breast cancer Maternal Aunt        mat great aunt   Cancer Maternal Grandmother        cervical   Heart disease Maternal Grandmother    Hypertension Maternal Grandmother     Social History   Tobacco Use   Smoking status: Never   Smokeless tobacco: Never  Vaping Use   Vaping Use: Never used  Substance Use Topics   Alcohol use: Not Currently    Alcohol/week: 0.0 standard drinks   Drug use: No     Allergies  Allergen Reactions   Meloxicam Other (See Comments)    Felt horrible, headache, bloating, back pain, SHOB; no rash   Zithromax [Azithromycin] Swelling    Tongue    Pseudoephedrine-Guaifenesin Swelling    Of tongue   Shellfish Allergy Rash   Shellfish-Derived Products Rash    Health Maintenance  Topic Date Due   Pneumococcal Vaccine 80-12 Years old (2 - PCV) 03/02/2019   COVID-19 Vaccine (3 - Booster for Pfizer series) 09/06/2019   PAP SMEAR-Modifier  11/12/2019   Zoster Vaccines- Shingrix (1 of 2) Never done   MAMMOGRAM  07/25/2021   TETANUS/TDAP  06/09/2024   COLONOSCOPY (Pts 45-42yrs Insurance coverage will need to be confirmed)  05/17/2028   INFLUENZA VACCINE  Completed   Hepatitis C Screening  Completed   HIV Screening  Completed   HPV VACCINES  Aged Out    Chart Review Today: I personally reviewed active problem list, medication list, allergies, family history, social history, health maintenance, notes from last encounter, lab results, imaging with the  patient/caregiver today.   Review of Systems  Constitutional: Negative.   HENT: Negative.    Eyes: Negative.   Respiratory: Negative.    Cardiovascular: Negative.   Gastrointestinal: Negative.   Endocrine: Negative.   Genitourinary: Negative.   Musculoskeletal: Negative.   Skin: Negative.   Allergic/Immunologic: Negative.   Neurological: Negative.   Hematological: Negative.   Psychiatric/Behavioral: Negative.    All other systems reviewed and are negative.   Objective:   Vitals:   05/22/21 1411  BP: 124/78  Pulse: 84  Resp: 16  Temp: 97.9 F (36.6 C)  TempSrc: Oral  SpO2: 99%  Weight: 266 lb 11.2 oz (121 kg)  Height: 5\' 6"  (1.676 m)    Body mass index is 43.05 kg/m.  Physical Exam Vitals and nursing note reviewed.  Constitutional:      General: She is not in acute distress.    Appearance: Normal appearance. She is well-developed. She is obese. She is not ill-appearing, toxic-appearing or diaphoretic.     Interventions: Face mask in place.  HENT:     Head: Normocephalic and atraumatic.     Right Ear: External ear normal.     Left Ear: External ear normal.  Eyes:     General: Lids are normal. No scleral icterus.       Right eye: No discharge.        Left eye: No discharge.     Conjunctiva/sclera: Conjunctivae normal.  Neck:     Trachea: Phonation normal. No tracheal deviation.  Cardiovascular:     Rate and Rhythm: Normal rate and regular rhythm.     Pulses: Normal pulses.          Radial pulses are 2+ on the right side and 2+ on the left side.       Posterior tibial pulses are 2+ on the  right side and 2+ on the left side.     Heart sounds: Normal heart sounds. No murmur heard.   No friction rub. No gallop.  Pulmonary:     Effort: Pulmonary effort is normal. No respiratory distress.     Breath sounds: Normal breath sounds. No stridor. No wheezing, rhonchi or rales.  Chest:     Chest wall: No tenderness.  Abdominal:     General: Bowel sounds are normal.  There is no distension.     Palpations: Abdomen is soft.  Musculoskeletal:     Right lower leg: No edema.     Left lower leg: No edema.  Skin:    General: Skin is warm and dry.     Coloration: Skin is not jaundiced or pale.     Findings: No rash.  Neurological:     Mental Status: She is alert.     Motor: No abnormal muscle tone.     Gait: Gait normal.  Psychiatric:        Mood and Affect: Mood normal.        Speech: Speech normal.        Behavior: Behavior normal.     Diabetic Foot Exam - Simple   Simple Foot Form Diabetic Foot exam was performed with the following findings: Yes 05/22/2021  2:40 PM  Visual Inspection No deformities, no ulcerations, no other skin breakdown bilaterally: Yes Sensation Testing Intact to touch and monofilament testing bilaterally: Yes Pulse Check Posterior Tibialis and Dorsalis pulse intact bilaterally: Yes Comments       Assessment & Plan:   1. Hypertension, goal below 140/90 At goal today on 5 mg norvasc - may need to change to ACEI/ARB for renal protection - COMPLETE METABOLIC PANEL WITH GFR  2. Morbid obesity ([place]) Encouraged healthy diet/lifestyle and weight loss - COMPLETE METABOLIC PANEL WITH GFR  3. Type 2 diabetes mellitus without complication, without long-term current use of insulin ([place]) New onset DM Educated pt today about T2DM and standards of care - discussed increasing metformin dose if needed (MOA, SE etc) pt currently tolerating 500 mg once daily, adding ACEI/ARB for kidney protection and statin for lowering lipids and to protect against increase stroke risk 4 month f/up after med changes Foot exam done today She will do DM eye exam - Hemoglobin A1c - COMPLETE METABOLIC PANEL WITH GFR - Ambulatory referral to Ophthalmology  4. Mixed hyperlipidemia Last lipids very high, new onset dm, statin indicated - COMPLETE METABOLIC PANEL WITH GFR  5. Anxiety Doing well, phq9 and gad 7 reviewed, continue lexapro - CBC with  Differential/Platelet  6. Mild persistent asthma with allergic rhinitis without complication Much more severe in spring season, currently only needing rescue inhaler rarely Discussed having singular and anoro and allergy meds available to restart before asthma worsens in spring - montelukast (SINGULAIR) 10 MG tablet; TAKE 1 TABLET BY MOUTH EVERYDAY AT BEDTIME  Dispense: 90 tablet; Refill: 1 - umeclidinium-vilanterol (ANORO ELLIPTA) 62.5-25 MCG/ACT AEPB; Inhale 1 puff into the lungs daily at 6 (six) AM.  Dispense: 60 each; Refill: 5  7. Encounter for medication monitoring  - Hemoglobin A1c - COMPLETE METABOLIC PANEL WITH GFR - Microalbumin, urine - VITAMIN D 25 Hydroxy (Vit-D Deficiency, Fractures) - CBC with Differential/Platelet  8. Elevated alkaline phosphatase level Lab abnormal from 2021 and newly elevated - f/up today - COMPLETE METABOLIC PANEL WITH GFR - Parathyroid hormone, intact (no Ca)  9. Vitamin D deficiency Last level was adequate but rechecking with abnormal  alkphos - COMPLETE METABOLIC PANEL WITH GFR - VITAMIN D 25 Hydroxy (Vit-D Deficiency, Fractures) - Parathyroid hormone, intact (no Ca)  10. Mild persistent asthma with allergic rhinitis without complication See above - montelukast (SINGULAIR) 10 MG tablet; TAKE 1 TABLET BY MOUTH EVERYDAY AT BEDTIME  Dispense: 90 tablet; Refill: 1 - umeclidinium-vilanterol (ANORO ELLIPTA) 62.5-25 MCG/ACT AEPB; Inhale 1 puff into the lungs daily at 6 (six) AM.  Dispense: 60 each; Refill: 5   No follow-ups on file.   Delsa Grana, PA-C 05/22/21 2:25 PM

## 2021-05-23 LAB — CBC WITH DIFFERENTIAL/PLATELET
Absolute Monocytes: 474 cells/uL (ref 200–950)
Basophils Absolute: 18 cells/uL (ref 0–200)
Basophils Relative: 0.3 %
Eosinophils Absolute: 162 cells/uL (ref 15–500)
Eosinophils Relative: 2.7 %
HCT: 43.3 % (ref 35.0–45.0)
Hemoglobin: 13.7 g/dL (ref 11.7–15.5)
Lymphs Abs: 2412 cells/uL (ref 850–3900)
MCH: 25.9 pg — ABNORMAL LOW (ref 27.0–33.0)
MCHC: 31.6 g/dL — ABNORMAL LOW (ref 32.0–36.0)
MCV: 82 fL (ref 80.0–100.0)
MPV: 11.2 fL (ref 7.5–12.5)
Monocytes Relative: 7.9 %
Neutro Abs: 2934 cells/uL (ref 1500–7800)
Neutrophils Relative %: 48.9 %
Platelets: 287 10*3/uL (ref 140–400)
RBC: 5.28 10*6/uL — ABNORMAL HIGH (ref 3.80–5.10)
RDW: 13.5 % (ref 11.0–15.0)
Total Lymphocyte: 40.2 %
WBC: 6 10*3/uL (ref 3.8–10.8)

## 2021-05-23 LAB — VITAMIN D 25 HYDROXY (VIT D DEFICIENCY, FRACTURES): Vit D, 25-Hydroxy: 24 ng/mL — ABNORMAL LOW (ref 30–100)

## 2021-05-23 LAB — PARATHYROID HORMONE, INTACT (NO CA): PTH: 72 pg/mL (ref 16–77)

## 2021-05-23 LAB — COMPLETE METABOLIC PANEL WITH GFR
AG Ratio: 1.1 (calc) (ref 1.0–2.5)
ALT: 61 U/L — ABNORMAL HIGH (ref 6–29)
AST: 36 U/L — ABNORMAL HIGH (ref 10–35)
Albumin: 4 g/dL (ref 3.6–5.1)
Alkaline phosphatase (APISO): 119 U/L (ref 37–153)
BUN: 11 mg/dL (ref 7–25)
CO2: 28 mmol/L (ref 20–32)
Calcium: 9.2 mg/dL (ref 8.6–10.4)
Chloride: 103 mmol/L (ref 98–110)
Creat: 0.59 mg/dL (ref 0.50–1.03)
Globulin: 3.6 g/dL (calc) (ref 1.9–3.7)
Glucose, Bld: 91 mg/dL (ref 65–99)
Potassium: 3.8 mmol/L (ref 3.5–5.3)
Sodium: 138 mmol/L (ref 135–146)
Total Bilirubin: 0.3 mg/dL (ref 0.2–1.2)
Total Protein: 7.6 g/dL (ref 6.1–8.1)
eGFR: 110 mL/min/{1.73_m2} (ref >=60)

## 2021-05-23 LAB — MICROALBUMIN, URINE: Microalb, Ur: 0.4 mg/dL

## 2021-05-23 LAB — HEMOGLOBIN A1C
Hgb A1c MFr Bld: 6.4 % of total Hgb — ABNORMAL HIGH (ref <5.7)
Mean Plasma Glucose: 137 mg/dL
eAG (mmol/L): 7.6 mmol/L

## 2021-05-29 ENCOUNTER — Other Ambulatory Visit: Payer: Self-pay | Admitting: Family Medicine

## 2021-05-29 MED ORDER — METFORMIN HCL ER 500 MG PO TB24: 500.0000 mg | ORAL_TABLET | Freq: Every day | ORAL | 0 refills | Status: DC

## 2021-06-17 ENCOUNTER — Other Ambulatory Visit: Payer: Self-pay | Admitting: Family Medicine

## 2021-06-17 DIAGNOSIS — F419 Anxiety disorder, unspecified: Secondary | ICD-10-CM

## 2021-06-17 DIAGNOSIS — F321 Major depressive disorder, single episode, moderate: Secondary | ICD-10-CM

## 2021-06-20 ENCOUNTER — Other Ambulatory Visit: Payer: Self-pay

## 2021-06-20 ENCOUNTER — Ambulatory Visit
Admission: EM | Admit: 2021-06-20 | Discharge: 2021-06-20 | Disposition: A | Discharge status: Home / Self Care | Payer: BC Managed Care – PPO | Attending: Physician Assistant | Admitting: Physician Assistant

## 2021-06-20 ENCOUNTER — Encounter: Payer: Self-pay | Admitting: Emergency Medicine

## 2021-06-20 DIAGNOSIS — R051 Acute cough: Secondary | ICD-10-CM | POA: Diagnosis not present

## 2021-06-20 DIAGNOSIS — M791 Myalgia, unspecified site: Secondary | ICD-10-CM | POA: Diagnosis not present

## 2021-06-20 DIAGNOSIS — U071 COVID-19: Secondary | ICD-10-CM | POA: Diagnosis not present

## 2021-06-20 LAB — RESP PANEL BY RT-PCR (FLU A&B, COVID) ARPGX2
Influenza A by PCR: NEGATIVE
Influenza B by PCR: NEGATIVE
SARS Coronavirus 2 by RT PCR: POSITIVE — AB

## 2021-06-20 MED ORDER — PROMETHAZINE-DM 6.25-15 MG/5ML PO SYRP: 5.0000 mL | ORAL_SOLUTION | Freq: Four times a day (QID) | ORAL | 0 refills | Status: DC | PRN

## 2021-06-20 MED ORDER — MOLNUPIRAVIR 200 MG PO CAPS: 4.0000 | ORAL_CAPSULE | Freq: Two times a day (BID) | ORAL | 0 refills | Status: AC

## 2021-06-20 NOTE — Discharge Instructions (Addendum)
-  Your COVID test was positive.  You need to isolate until Monday and then mask x 5 days -Increase rest and fluids.  I sent antiviral medicine for COVID to the pharmacy as well as cough medicine. - Use your inhaler if you feel short of breath but if you feel your breathing is not controlled by your inhalers or is worsening you need to be seen again.  For any severe breathing difficulty, call EMS or go to ER. -You should feel better within a week or so.

## 2021-06-20 NOTE — ED Triage Notes (Signed)
Pt c/o body aches, cough, nasal congestion, fever, and headache. Started about 2 days ago. Daughter tested positive for covid today.

## 2021-06-20 NOTE — ED Provider Notes (Signed)
[facility]    CSN: [id number] Arrival date & time: 06/20/21  1330      History   Chief Complaint Chief Complaint  Patient presents with   Generalized Body Aches   Cough    HPI Alisha Kim is a 51 y.o. female presenting for 2-day history of low-grade fever, fatigue, bodies, cough, congestion and headaches.  Patient says her daughter tested positive for COVID today.  Patient reports personal history of COVID-19 7 months ago.  Has been taking over-the-counter Robitussin for symptoms.  She denies any breathing difficulty.  Does have history of asthma and uses inhaler as needed.  Denies nausea/vomiting or diarrhea.  Medical history significant for hypertension, hyperlipidemia, prediabetes, asthma, allergies.  HPI  Past Medical History:  Diagnosis Date   Adenomyosis 09/2015   Allergy    Anemia    history of   Anxiety    controlled;    Asthma    Depression with anxiety    Diabetes mellitus without complication [facility])    was told she was pre-diabetic   Fibroids 08/14/2016   [place] March 4656   History of Helicobacter pylori infection 08/08/2016   2004   Hydrosalpinx 08/14/2016   right   Hypertension    controlled with medication;    Mixed hyperlipidemia 07/09/2020   Pre-diabetes     Patient Active Problem List   Diagnosis Date Noted   Elevated alkaline phosphatase level 05/22/2021   Vitamin D deficiency 05/22/2021   Mixed hyperlipidemia 07/09/2020   Current moderate episode of major depressive disorder ([place]) 02/17/2020   Grief reaction 02/17/2020   Stress reaction 02/17/2020   Morbid obesity ([place]) 05/31/2018   Colon cancer screening    Allergic rhinitis 03/01/2018   Cervical stenosis of spinal canal 08/28/2017   S/P vaginal hysterectomy 81/27/5170   History of Helicobacter pylori infection 08/08/2016   Microcytosis 08/01/2016   Heel spur, left 04/07/2016   Bilateral foot pain 04/04/2016   Anxiety 04/04/2016   Hypertension, goal below 140/90  07/24/2015   Bilateral hip pain 11/17/2014   Atopic dermatitis 11/17/2014   Low serum vitamin D 11/17/2014   Pre-diabetes 11/17/2014   Depression with anxiety 11/17/2014   Mild persistent asthma with allergic rhinitis without complication 01/74/9449    Past Surgical History:  Procedure Laterality Date   ABDOMINAL HYSTERECTOMY     COLONOSCOPY WITH PROPOFOL N/A 05/17/2018   Procedure: COLONOSCOPY WITH PROPOFOL;  Surgeon: Lin Landsman, MD;  Location: [facility] ENDOSCOPY;  Service: Gastroenterology;  Laterality: N/A;   DENTAL SURGERY     NO PAST SURGERIES     TONSILLECTOMY     VAGINAL HYSTERECTOMY Bilateral 09/29/2016   Procedure: HYSTERECTOMY VAGINAL WITH BILATERAL SALPINGECTOMY;  Surgeon: [name], MD;  Location: [facility] ORS;  Service: Gynecology;  Laterality: Bilateral;    OB History     Gravida  1   Para  1   Term  1   Preterm      AB      Living  1      SAB      IAB      Ectopic      Multiple      Live Births  1            Home Medications    Prior to Admission medications   Medication Sig Start Date End Date Taking? Authorizing Provider  albuterol (PROAIR HFA) 108 (90 Base) MCG/ACT inhaler Inhale 1-2 puffs into the lungs every 4 (four) hours as  needed for wheezing or shortness of breath. 02/20/21  Yes Myles Gip, DO  amLODipine (NORVASC) 5 MG tablet Take 1 tablet (5 mg total) by mouth every evening. 07/09/20  Yes Delsa Grana, PA-C  escitalopram (LEXAPRO) 10 MG tablet TAKE 1 TABLET BY MOUTH EVERY DAY 06/17/21  Yes Delsa Grana, PA-C  hydrOXYzine (ATARAX/VISTARIL) 10 MG tablet Take 1-2 tablets (10-20 mg total) by mouth every 6 (six) hours as needed for itching. 07/09/20  Yes Delsa Grana, PA-C  metFORMIN (GLUCOPHAGE XR) 500 MG 24 hr tablet Take 1 tablet (500 mg total) by mouth daily with breakfast. 05/29/21  Yes Tapia, Leisa, PA-C  molnupiravir EUA (LAGEVRIO) 200 MG CAPS capsule Take 4 capsules (800 mg total) by mouth 2 (two) times daily for 5 days.  06/20/21 06/25/21 Yes Laurene Footman B, PA-C  montelukast (SINGULAIR) 10 MG tablet TAKE 1 TABLET BY MOUTH EVERYDAY AT BEDTIME 05/22/21  Yes Delsa Grana, PA-C  promethazine-dextromethorphan (PROMETHAZINE-DM) 6.25-15 MG/5ML syrup Take 5 mLs by mouth 4 (four) times daily as needed. 06/20/21  Yes Danton Clap, PA-C  umeclidinium-vilanterol (ANORO ELLIPTA) 62.5-25 MCG/ACT AEPB Inhale 1 puff into the lungs daily at 6 (six) AM. 08/12/21  Yes Delsa Grana, PA-C    Family History Family History  Problem Relation Age of Onset   Alcohol abuse Mother    Arthritis Mother    Asthma Mother    Depression Mother    Drug abuse Mother    Hypertension Mother    Mental illness Mother    Cancer Mother        breast and lung   Diabetes Mother    Breast cancer Mother 3   Alcohol abuse Father    Drug abuse Father    Cancer Father        lung   Hypertension Brother    Hearing loss Maternal Aunt    Hypertension Maternal Aunt    Stroke Maternal Aunt    Alzheimer's disease Maternal Aunt    Breast cancer Maternal Aunt        mat great aunt   Cancer Maternal Grandmother        cervical   Heart disease Maternal Grandmother    Hypertension Maternal Grandmother     Social History Social History   Tobacco Use   Smoking status: Never   Smokeless tobacco: Never  Vaping Use   Vaping Use: Never used  Substance Use Topics   Alcohol use: Not Currently    Alcohol/week: 0.0 standard drinks   Drug use: No     Allergies   Meloxicam, Zithromax [azithromycin], Pseudoephedrine-guaifenesin, Shellfish allergy, and Shellfish-derived products   Review of Systems Review of Systems  Constitutional:  Positive for fatigue and fever. Negative for chills and diaphoresis.  HENT:  Positive for congestion and rhinorrhea. Negative for ear pain, sinus pressure, sinus pain and sore throat.   Respiratory:  Positive for cough. Negative for shortness of breath.   Gastrointestinal:  Negative for abdominal pain, nausea and  vomiting.  Musculoskeletal:  Positive for myalgias. Negative for arthralgias.  Skin:  Negative for rash.  Neurological:  Positive for headaches. Negative for weakness.  Hematological:  Negative for adenopathy.    Physical Exam Triage Vital Signs ED Triage Vitals  Enc Vitals Group     BP 06/20/21 1430 (!) 138/92     Pulse Rate 06/20/21 1430 74     Resp 06/20/21 1430 18     Temp 06/20/21 1430 98.2 F (36.8 C)     Temp  Source 06/20/21 1430 Oral     SpO2 06/20/21 1430 100 %     Weight 06/20/21 1427 266 lb 12.1 oz (121 kg)     Height 06/20/21 1427 5\' 5"  (1.651 m)     Head Circumference --      Peak Flow --      Pain Score 06/20/21 1427 5     Pain Loc --      Pain Edu? --      Excl. in [place]? --    No data found.  Updated Vital Signs BP (!) 138/92 (BP Location: Left Arm)    Pulse 74    Temp 98.2 F (36.8 C) (Oral)    Resp 18    Ht 5\' 5"  (1.651 m)    Wt 266 lb 12.1 oz (121 kg)    LMP 09/29/2016 Comment: partial   SpO2 100%    BMI 44.39 kg/m      Physical Exam Vitals and nursing note reviewed.  Constitutional:      General: She is not in acute distress.    Appearance: Normal appearance. She is ill-appearing. She is not toxic-appearing.  HENT:     Head: Normocephalic and atraumatic.     Nose: Congestion and rhinorrhea present.     Mouth/Throat:     Mouth: Mucous membranes are moist.     Pharynx: Oropharynx is clear.  Eyes:     General: No scleral icterus.       Right eye: No discharge.        Left eye: No discharge.     Conjunctiva/sclera: Conjunctivae normal.  Cardiovascular:     Rate and Rhythm: Normal rate and regular rhythm.     Heart sounds: Normal heart sounds.  Pulmonary:     Effort: Pulmonary effort is normal. No respiratory distress.     Breath sounds: Normal breath sounds. No wheezing, rhonchi or rales.  Musculoskeletal:     Cervical back: Neck supple.  Skin:    General: Skin is dry.  Neurological:     General: No focal deficit present.     Mental Status:  She is alert. Mental status is at baseline.     Motor: No weakness.     Gait: Gait normal.  Psychiatric:        Mood and Affect: Mood normal.        Behavior: Behavior normal.        Thought Content: Thought content normal.     UC Treatments / Results  Labs (all labs ordered are listed, but only abnormal results are displayed) Labs Reviewed  RESP PANEL BY RT-PCR (FLU A&B, COVID) ARPGX2 - Abnormal; Notable for the following components:      Result Value   SARS Coronavirus 2 by RT PCR POSITIVE (*)    All other components within normal limits    EKG   Radiology No results found.  Procedures Procedures (including critical care time)  Medications Ordered in UC Medications - No data to display  Initial Impression / Assessment and Plan / UC Course  I have reviewed the triage vital signs and the nursing notes.  Pertinent labs & imaging results that were available during my care of the patient were reviewed by me and considered in my medical decision making (see chart for details).  51 year old female presenting for 2-day history of low-grade fever, fatigue, body aches, cough, congestion.   Vitals all stable.  Patient ill-appearing but nontoxic.  On exam she does have nasal congestion and  trace clear nasal drainage.  Chest clear to auscultation heart regular rate and rhythm.  Respiratory panel obtained.  Positive for COVID-19.  Discussed results.  Reviewed current CDC guidelines, isolation protocol and ED precautions.  Sent Molnupiravir and Promethazine DM to pharmacy.  Patient took these medications in June when she had COVID-19 and says they worked well.  Encouraged her to increase rest and fluids.  Gave her a work note.  Reviewed return and ER precautions.   Final Clinical Impressions(s) / UC Diagnoses   Final diagnoses:  COVID-19  Acute cough  Myalgia     Discharge Instructions      -Your COVID test was positive.  You need to isolate until Monday and then mask x 5  days -Increase rest and fluids.  I sent antiviral medicine for COVID to the pharmacy as well as cough medicine. - Use your inhaler if you feel short of breath but if you feel your breathing is not controlled by your inhalers or is worsening you need to be seen again.  For any severe breathing difficulty, call EMS or go to ER. -You should feel better within a week or so.     ED Prescriptions     Medication Sig Dispense Auth. Provider   molnupiravir EUA (LAGEVRIO) 200 MG CAPS capsule Take 4 capsules (800 mg total) by mouth 2 (two) times daily for 5 days. 40 capsule Danton Clap, PA-C   promethazine-dextromethorphan (PROMETHAZINE-DM) 6.25-15 MG/5ML syrup Take 5 mLs by mouth 4 (four) times daily as needed. 118 mL Danton Clap, PA-C      PDMP not reviewed this encounter.   Danton Clap, PA-C 06/20/21 1547

## 2021-07-22 ENCOUNTER — Other Ambulatory Visit: Payer: Self-pay | Admitting: Family Medicine

## 2021-07-22 DIAGNOSIS — I10 Essential (primary) hypertension: Secondary | ICD-10-CM

## 2021-08-26 ENCOUNTER — Other Ambulatory Visit: Payer: Self-pay | Admitting: Family Medicine

## 2021-09-20 ENCOUNTER — Ambulatory Visit: Payer: BC Managed Care – PPO | Admitting: Family Medicine

## 2021-09-20 ENCOUNTER — Encounter: Payer: Self-pay | Admitting: Internal Medicine

## 2021-09-20 ENCOUNTER — Ambulatory Visit (INDEPENDENT_AMBULATORY_CARE_PROVIDER_SITE_OTHER): Payer: BC Managed Care – PPO | Admitting: Internal Medicine

## 2021-09-20 VITALS — BP 122/64 | HR 100 | Temp 97.5°F | Resp 16 | Ht 66.0 in | Wt 271.9 lb

## 2021-09-20 DIAGNOSIS — I10 Essential (primary) hypertension: Secondary | ICD-10-CM

## 2021-09-20 DIAGNOSIS — R7303 Prediabetes: Secondary | ICD-10-CM

## 2021-09-20 DIAGNOSIS — Z1231 Encounter for screening mammogram for malignant neoplasm of breast: Secondary | ICD-10-CM

## 2021-09-20 DIAGNOSIS — F331 Major depressive disorder, recurrent, moderate: Secondary | ICD-10-CM

## 2021-09-20 DIAGNOSIS — E782 Mixed hyperlipidemia: Secondary | ICD-10-CM

## 2021-09-20 DIAGNOSIS — J302 Other seasonal allergic rhinitis: Secondary | ICD-10-CM

## 2021-09-20 DIAGNOSIS — F419 Anxiety disorder, unspecified: Secondary | ICD-10-CM

## 2021-09-20 DIAGNOSIS — J4530 Mild persistent asthma, uncomplicated: Secondary | ICD-10-CM

## 2021-09-20 MED ORDER — AMLODIPINE BESYLATE 5 MG PO TABS: ORAL_TABLET | ORAL | 1 refills | Status: DC

## 2021-09-20 MED ORDER — SERTRALINE HCL 25 MG PO TABS: 25.0000 mg | ORAL_TABLET | Freq: Every day | ORAL | 3 refills | Status: DC

## 2021-09-20 MED ORDER — METFORMIN HCL ER 500 MG PO TB24: ORAL_TABLET | ORAL | 1 refills | Status: DC

## 2021-09-20 NOTE — Assessment & Plan Note (Signed)
Last A1c 6.4, on Metformin which was refilled today. Plan to recheck A1c with next set of labs in the fall.  ?

## 2021-09-20 NOTE — Assessment & Plan Note (Signed)
Worse, will decrease to Lexapro 5 mg over the next week and then switch to Zoloft 25 mg daily. Educated on medication and how to take it, potential side effects and what to expect. Follow up in 6 week to recheck. ?

## 2021-09-20 NOTE — Assessment & Plan Note (Signed)
Stable, continue Amlodipine 5, refilled today. ?

## 2021-09-20 NOTE — Assessment & Plan Note (Signed)
Reviewed lipid panel from September from the patient, discussed in detail. Recommend starting a statin, however the patient would like to try diet and lifestyle management first. Plan to recheck in the fall.  ?

## 2021-09-20 NOTE — Assessment & Plan Note (Signed)
Stable, usually bothers her in the spring months but has not had issues yet this year. Recommend Flonase and an oral-antihistamine if she develops symptoms. ?

## 2021-09-20 NOTE — Assessment & Plan Note (Signed)
Working on lifestyle changes

## 2021-09-20 NOTE — Patient Instructions (Addendum)
It was great seeing you today! ? ?Plan discussed at today's visit: ?-Start taking Lexapro 5 mg for 1 week, then start Zoloft 25 mg daily ?-Work on lifestyle changes - decease red meats, fatty processed foods to decrease cholesterol, next time plan for blood work in the fall, please fast for 8-12 hours  ?-No other medications changes, refills today  ? ?Follow up in: 6 weeks ? ?Take care and let us know if you have any questions or concerns prior to your next visit. ? ?Dr. Rosana Berger ? ?

## 2021-09-20 NOTE — Assessment & Plan Note (Signed)
Switch from Lexapro to Zoloft.  ?

## 2021-09-20 NOTE — Assessment & Plan Note (Signed)
Stable on Anoro, doing well.  ?

## 2021-09-20 NOTE — Progress Notes (Signed)
? ?Established Patient Office Visit ? ?Subjective:  ?Patient ID: Alisha Kim, female    DOB: [birth year]  Age: 51 y.o. MRN: [id number] ? ?CC:  ?Chief Complaint  ?Patient presents with  ? Follow-up  ? Diabetes  ? Hyperlipidemia  ? Hypertension  ? ? ?HPI ?Alisha Kim presents for follow up on chronic medical conditions.  ? ?Hypertension: ?-Medications: Amlodipine 5 ?-Patient is compliant with above medications and reports no side effects. ?-Checking BP at home (average): 120/80 ?-Denies any SOB, CP, vision changes, LE edema or symptoms of hypotension. Can tell when BP high, will get headaches (usually when upset) ? ?HLD: ?-Medications: None ?-Last lipid panel: Lipid Panel  ?   ?Component Value Date/Time  ? CHOL 214 (H) 02/20/2021 1541  ? TRIG 69 02/20/2021 1541  ? HDL 42 (L) 02/20/2021 1541  ? CHOLHDL 5.1 (H) 02/20/2021 1541  ? VLDL 7 04/04/2016 1011  ? LDLCALC 156 (H) 02/20/2021 1541  ? ?The 10-year ASCVD risk score (Arnett DK, et al., 2019) is: 10.2% ?  Values used to calculate the score: ?    Age: 2 years ?    Sex: Female ?    Is Non-Hispanic African American: Yes ?    Diabetic: Yes ?    Tobacco smoker: No ?    Systolic Blood Pressure: 749 mmHg ?    Is BP treated: Yes ?    HDL Cholesterol: 42 mg/dL ?    Total Cholesterol: 214 mg/dL ? ?Pre-Diabetes: ?-Last A1c 12/22 6.4 ?-Currently on Metformin 500 XR daily, doing well, no side effects.  ? ?Asthma:  ?-Asthma status: stable ?-Current Treatments: Anoro Ellipta, Albuterol PRN ?-Satisfied with current treatment?: yes ?-Albuterol/rescue inhaler frequency: once every 3 months or so ?-Dyspnea frequency: none ?-Wheezing frequency: none ?-Cough frequency: none ?-Nocturnal symptom frequency: none ?-Limitation of activity: no ?-Current upper respiratory symptoms: no ?-Triggers: allergies in the spring ?-Home peak flows: ?-Influenza: Up to Date ? ?MDD: ?-Mood status: uncontrolled ?-Current treatment: Lexapro 10, Hydroxyzine PRN  ?-Satisfied with current treatment?:  no ?-Symptom severity: severe  ?-Duration of current treatment : months ?-Side effects: no; just doesn't feel like the medication is working, feels mainly anxiety. Daughter takes Zoloft and does well with it.  ?Medication compliance: excellent compliance ? ? ?  09/20/2021  ?  1:01 PM 05/22/2021  ?  2:14 PM 02/20/2021  ?  3:02 PM 11/08/2020  ?  9:54 AM 07/09/2020  ?  3:46 PM  ?Depression screen PHQ 2/9  ?Decreased Interest 0 0 0 0 0  ?Down, Depressed, Hopeless 0 0 0 0 0  ?PHQ - 2 Score 0 0 0 0 0  ?Altered sleeping 0 2  0 1  ?Tired, decreased energy 0 0  0 0  ?Change in appetite 0 0  0 0  ?Feeling bad or failure about yourself  0 0  0 0  ?Trouble concentrating 0 0  0 0  ?Moving slowly or fidgety/restless 0 0  0 0  ?Suicidal thoughts 0 0  0 0  ?PHQ-9 Score 0 2  0 1  ?Difficult doing work/chores Not difficult at all Not difficult at all  Not difficult at all Not difficult at all  ? ? ?Health Maintenance: ?-Blood work UTD ?-Mammogram 2/22 Birads-1, due ?-Colonoscopy 12/19: repeat in 10 years ? ?Past Medical History:  ?Diagnosis Date  ? Adenomyosis 09/2015  ? Allergy   ? Anemia   ? history of  ? Anxiety   ? controlled;   ? Asthma   ?  Depression with anxiety   ? Diabetes mellitus without complication ([place])   ? was told she was pre-diabetic  ? Fibroids 08/14/2016  ? [place] March 2018  ? History of Helicobacter pylori infection 08/08/2016  ? 2004  ? Hydrosalpinx 08/14/2016  ? right  ? Hypertension   ? controlled with medication;   ? Mixed hyperlipidemia 07/09/2020  ? Pre-diabetes   ? ? ?Past Surgical History:  ?Procedure Laterality Date  ? ABDOMINAL HYSTERECTOMY    ? COLONOSCOPY WITH PROPOFOL N/A 05/17/2018  ? Procedure: COLONOSCOPY WITH PROPOFOL;  Surgeon: Lin Landsman, MD;  Location: [facility] ENDOSCOPY;  Service: Gastroenterology;  Laterality: N/A;  ? DENTAL SURGERY    ? NO PAST SURGERIES    ? TONSILLECTOMY    ? VAGINAL HYSTERECTOMY Bilateral 09/29/2016  ? Procedure: HYSTERECTOMY VAGINAL WITH BILATERAL SALPINGECTOMY;  Surgeon:  [name], MD;  Location: [facility] ORS;  Service: Gynecology;  Laterality: Bilateral;  ? ? ?Family History  ?Problem Relation Age of Onset  ? Alcohol abuse Mother   ? Arthritis Mother   ? Asthma Mother   ? Depression Mother   ? Drug abuse Mother   ? Hypertension Mother   ? Mental illness Mother   ? Cancer Mother   ?     breast and lung  ? Diabetes Mother   ? Breast cancer Mother 48  ? Alcohol abuse Father   ? Drug abuse Father   ? Cancer Father   ?     lung  ? Hypertension Brother   ? Hearing loss Maternal Aunt   ? Hypertension Maternal Aunt   ? Stroke Maternal Aunt   ? Alzheimer's disease Maternal Aunt   ? Breast cancer Maternal Aunt   ?     mat great aunt  ? Cancer Maternal Grandmother   ?     cervical  ? Heart disease Maternal Grandmother   ? Hypertension Maternal Grandmother   ? ? ?Social History  ? ?Socioeconomic History  ? Marital status: Married  ?  Spouse name: Not on file  ? Number of children: Not on file  ? Years of education: Not on file  ? Highest education level: Not on file  ?Occupational History  ? Not on file  ?Tobacco Use  ? Smoking status: Never  ? Smokeless tobacco: Never  ?Vaping Use  ? Vaping Use: Never used  ?Substance and Sexual Activity  ? Alcohol use: Not Currently  ?  Alcohol/week: 0.0 standard drinks  ? Drug use: No  ? Sexual activity: Yes  ?  Partners: Male  ?  Birth control/protection: None, Surgical  ?Other Topics Concern  ? Not on file  ?Social History Narrative  ? Not on file  ? ?Social Determinants of Health  ? ?Financial Resource Strain: Not on file  ?Food Insecurity: Not on file  ?Transportation Needs: Not on file  ?Physical Activity: Not on file  ?Stress: Not on file  ?Social Connections: Not on file  ?Intimate Partner Violence: Not on file  ? ? ?Outpatient Medications Prior to Visit  ?Medication Sig Dispense Refill  ? albuterol (PROAIR HFA) 108 (90 Base) MCG/ACT inhaler Inhale 1-2 puffs into the lungs every 4 (four) hours as needed for wheezing or shortness of breath. 1 each 5  ?  amLODipine (NORVASC) 5 MG tablet TAKE 1 TABLET BY MOUTH EVERY DAY IN THE EVENING 90 tablet 0  ? escitalopram (LEXAPRO) 10 MG tablet TAKE 1 TABLET BY MOUTH EVERY DAY 90 tablet 0  ? hydrOXYzine (ATARAX/VISTARIL) 10  MG tablet Take 1-2 tablets (10-20 mg total) by mouth every 6 (six) hours as needed for itching. 120 tablet 2  ? metFORMIN (GLUCOPHAGE-XR) 500 MG 24 hr tablet TAKE 1 TABLET BY MOUTH EVERY DAY WITH BREAKFAST 90 tablet 0  ? montelukast (SINGULAIR) 10 MG tablet TAKE 1 TABLET BY MOUTH EVERYDAY AT BEDTIME 90 tablet 1  ? promethazine-dextromethorphan (PROMETHAZINE-DM) 6.25-15 MG/5ML syrup Take 5 mLs by mouth 4 (four) times daily as needed. 118 mL 0  ? umeclidinium-vilanterol (ANORO ELLIPTA) 62.5-25 MCG/ACT AEPB Inhale 1 puff into the lungs daily at 6 (six) AM. 60 each 5  ? ?No facility-administered medications prior to visit.  ? ? ?Allergies  ?Allergen Reactions  ? Meloxicam Other (See Comments)  ?  Felt horrible, headache, bloating, back pain, SHOB; no rash  ? Zithromax [Azithromycin] Swelling  ?  Tongue   ? Pseudoephedrine-Guaifenesin Swelling  ?  Of tongue  ? Shellfish Allergy Rash  ? Shellfish-Derived Products Rash  ? ? ?ROS ?Review of Systems  ?Constitutional:  Negative for chills and fever.  ?Eyes:  Negative for visual disturbance.  ?Respiratory:  Negative for cough, shortness of breath and wheezing.   ?Cardiovascular:  Negative for chest pain.  ?Gastrointestinal:  Negative for abdominal pain, diarrhea, nausea and vomiting.  ?Skin:  Positive for color change.  ?Neurological:  Negative for dizziness and headaches.  ? ?  ?Objective:  ?  ?Physical Exam ?Constitutional:   ?   Appearance: Normal appearance.  ?HENT:  ?   Head: Normocephalic and atraumatic.  ?Eyes:  ?   Conjunctiva/sclera: Conjunctivae normal.  ?Cardiovascular:  ?   Rate and Rhythm: Normal rate and regular rhythm.  ?Pulmonary:  ?   Effort: Pulmonary effort is normal.  ?   Breath sounds: Normal breath sounds.  ?Musculoskeletal:  ?   Right lower leg:  No edema.  ?   Left lower leg: No edema.  ?Skin: ?   General: Skin is warm and dry.  ?   Comments: Small hyperpigmented lesions on the bottom on right foot.   ?Neurological:  ?   General: No focal deficit pr

## 2021-09-21 ENCOUNTER — Other Ambulatory Visit: Payer: Self-pay | Admitting: Family Medicine

## 2021-09-21 DIAGNOSIS — F321 Major depressive disorder, single episode, moderate: Secondary | ICD-10-CM

## 2021-09-21 DIAGNOSIS — F419 Anxiety disorder, unspecified: Secondary | ICD-10-CM

## 2021-09-23 ENCOUNTER — Other Ambulatory Visit: Payer: Self-pay | Admitting: Family Medicine

## 2021-09-23 ENCOUNTER — Other Ambulatory Visit: Payer: Self-pay | Admitting: Internal Medicine

## 2021-09-23 DIAGNOSIS — Z1231 Encounter for screening mammogram for malignant neoplasm of breast: Secondary | ICD-10-CM

## 2021-09-23 NOTE — Telephone Encounter (Signed)
Not on current med list. ?Requested Prescriptions  ?Pending Prescriptions Disp Refills  ?? escitalopram (LEXAPRO) 10 MG tablet [Pharmacy Med Name: ESCITALOPRAM 10 MG TABLET] 90 tablet 0  ?  Sig: TAKE 1 TABLET BY MOUTH EVERY DAY  ?  ? Psychiatry:  Antidepressants - SSRI Passed - 09/21/2021  9:10 AM  ?  ?  Passed - Completed PHQ-2 or PHQ-9 in the last 360 days  ?  ?  Passed - Valid encounter within last 6 months  ?  Recent Outpatient Visits   ?      ? 3 days ago Hypertension, goal below 140/90  ? [facility] Teodora Medici, DO  ? 4 months ago Hypertension, goal below 140/90  ? [facility] Delsa Grana, PA-C  ? 7 months ago Mild persistent asthma with allergic rhinitis without complication  ? [place], DO  ? 10 months ago Bilateral carpal tunnel syndrome  ? [facility] Kathrine Haddock, NP  ? 1 year ago Low serum vitamin D  ? [facility] Delsa Grana, [place]  ?  ?  ?Future Appointments   ?        ? In 1 month Teodora Medici, [facility], [place]  ?  ? ?  ?  ?  ? ? ?

## 2021-10-13 ENCOUNTER — Other Ambulatory Visit: Payer: Self-pay | Admitting: Internal Medicine

## 2021-10-13 DIAGNOSIS — F419 Anxiety disorder, unspecified: Secondary | ICD-10-CM

## 2021-10-13 DIAGNOSIS — F331 Major depressive disorder, recurrent, moderate: Secondary | ICD-10-CM

## 2021-10-15 LAB — HM DIABETES EYE EXAM

## 2021-10-15 NOTE — Telephone Encounter (Signed)
Requested medication (s) are due for refill today: no ? ?Requested medication (s) are on the active medication list: yes ? ?Last refill:  09/20/21 ? ?Future visit scheduled: yes ? ?Notes to clinic:  Unable to refill per protocol, pharmacy requests a 90 day supply and a new Dx code. ? ? ?  ?Requested Prescriptions  ?Pending Prescriptions Disp Refills  ? sertraline (ZOLOFT) 25 MG tablet [Pharmacy Med Name: SERTRALINE HCL 25 MG TABLET] 90 tablet 2  ?  Sig: Take 1 tablet (25 mg total) by mouth daily.  ?  ? Psychiatry:  Antidepressants - SSRI - sertraline Failed - 10/13/2021 10:33 AM  ?  ?  Failed - AST in normal range and within 360 days  ?  AST  ?Date Value Ref Range Status  ?05/22/2021 36 (H) 10 - 35 U/L Final  ? ?SGOT(AST)  ?Date Value Ref Range Status  ?11/30/2012 21 15 - 37 Unit/L Final  ?  ?  ?  ?  Failed - ALT in normal range and within 360 days  ?  ALT  ?Date Value Ref Range Status  ?05/22/2021 61 (H) 6 - 29 U/L Final  ? ?SGPT (ALT)  ?Date Value Ref Range Status  ?11/30/2012 26 12 - 78 U/L Final  ? ?ALT (SGPT) P5P  ?Date Value Ref Range Status  ?03/14/2020 35 0 - 40 IU/L Final  ?  ?  ?  ?  Passed - Completed PHQ-2 or PHQ-9 in the last 360 days  ?  ?  Passed - Valid encounter within last 6 months  ?  Recent Outpatient Visits   ? ?      ? 3 weeks ago Hypertension, goal below 140/90  ? [facility] Teodora Medici, DO  ? 4 months ago Hypertension, goal below 140/90  ? [facility] Delsa Grana, PA-C  ? 7 months ago Mild persistent asthma with allergic rhinitis without complication  ? [place], DO  ? 11 months ago Bilateral carpal tunnel syndrome  ? [facility] Kathrine Haddock, NP  ? 1 year ago Low serum vitamin D  ? [facility] Delsa Grana, [place]  ? ?  ?  ?Future Appointments   ? ?        ? In 2 weeks Teodora Medici, DO [facility], [place]  ? ?  ? ? ?  ?  ?  ? ? ?

## 2021-10-31 NOTE — Progress Notes (Signed)
Established Patient Office Visit  Subjective:  Patient ID: Alisha Kim, female    DOB: Sep 23, 1970  Age: 51 y.o. MRN: [id number]  CC:  Chief Complaint  Patient presents with   Follow-up   Anxiety    HPI Alisha Kim presents for follow up on chronic medical conditions.   Hypertension: -Medications: Amlodipine 5 -Patient is compliant with above medications and reports no side effects. -Checking BP at home (average): 120/80 -Denies any SOB, CP, vision changes, LE edema or symptoms of hypotension. Can tell when BP high, will get headaches (usually when upset)  HLD: -Medications: None -Last lipid panel: Lipid Panel     Component Value Date/Time   CHOL 214 (H) 02/20/2021 1541   TRIG 69 02/20/2021 1541   HDL 42 (L) 02/20/2021 1541   CHOLHDL 5.1 (H) 02/20/2021 1541   VLDL 7 04/04/2016 1011   LDLCALC 156 (H) 02/20/2021 1541   The 10-year ASCVD risk score (Arnett DK, et al., 2019) is: 8.4%   Values used to calculate the score:     Age: 33 years     Sex: Female     Is Non-Hispanic African American: Yes     Diabetic: Yes     Tobacco smoker: No     Systolic Blood Pressure: 488 mmHg     Is BP treated: Yes     HDL Cholesterol: 42 mg/dL     Total Cholesterol: 214 mg/dL  Pre-Diabetes: -Last A1c 12/22 6.4 -Currently on Metformin 500 XR daily, doing well, no side effects.   Asthma:  -Asthma status: stable -Current Treatments: Anoro Ellipta, Albuterol PRN -Satisfied with current treatment?: yes -Albuterol/rescue inhaler frequency: once every 3 months or so -Dyspnea frequency: none -Wheezing frequency: none -Cough frequency: none -Nocturnal symptom frequency: none -Limitation of activity: no -Current upper respiratory symptoms: no -Triggers: allergies in the spring -Home peak flows: -Influenza: Up to Date  MDD: -Mood status: better -Current treatment: Started Zoloft 25 mg at LOV, discontinued Lexapro, Hydroxyzine PRN  -Satisfied with current treatment?:  yes -Symptom severity: severe  -Duration of current treatment : 6 weeks  -Side effects: no, doing well on the Zoloft. Did not have side effects from Lexapro, just didn't feel like it worked well for her Medication compliance: excellent compliance     11/01/2021   12:52 PM 09/20/2021    1:01 PM 05/22/2021    2:14 PM 02/20/2021    3:02 PM 11/08/2020    9:54 AM  Depression screen PHQ 2/9  Decreased Interest 0 0 0 0 0  Down, Depressed, Hopeless 0 0 0 0 0  PHQ - 2 Score 0 0 0 0 0  Altered sleeping 1 0 2  0  Tired, decreased energy 0 0 0  0  Change in appetite 0 0 0  0  Feeling bad or failure about yourself  0 0 0  0  Trouble concentrating 0 0 0  0  Moving slowly or fidgety/restless 0 0 0  0  Suicidal thoughts 0 0 0  0  PHQ-9 Score 1 0 2  0  Difficult doing work/chores Not difficult at all Not difficult at all Not difficult at all  Not difficult at all   Seasonal Allergies: -Exacerbated currently, takes Flonase.  -Symptoms include sneezing, runny nose, mild throat discomfort   Health Maintenance: -Blood work UTD -Mammogram 11/01/21 results pending  -Pap: due, will schedule with her gynecologist  -Colonoscopy 12/19: repeat in 10 years  Past Medical History:  Diagnosis Date  Adenomyosis 09/2015   Allergy    Anemia    history of   Anxiety    controlled;    Asthma    Depression with anxiety    Diabetes mellitus without complication [facility])    was told she was pre-diabetic   Fibroids 08/14/2016   [place] March 0981   History of Helicobacter pylori infection 08/08/2016   2004   Hydrosalpinx 08/14/2016   right   Hypertension    controlled with medication;    Mixed hyperlipidemia 07/09/2020   Pre-diabetes     Past Surgical History:  Procedure Laterality Date   ABDOMINAL HYSTERECTOMY     COLONOSCOPY WITH PROPOFOL N/A 05/17/2018   Procedure: COLONOSCOPY WITH PROPOFOL;  Surgeon: Lin Landsman, MD;  Location: [place];  Service: Gastroenterology;  Laterality: N/A;    DENTAL SURGERY     NO PAST SURGERIES     TONSILLECTOMY     VAGINAL HYSTERECTOMY Bilateral 09/29/2016   Procedure: HYSTERECTOMY VAGINAL WITH BILATERAL SALPINGECTOMY;  Surgeon: [name], MD;  Location: [facility];  Service: Gynecology;  Laterality: Bilateral;    Family History  Problem Relation Age of Onset   Alcohol abuse Mother    Arthritis Mother    Asthma Mother    Depression Mother    Drug abuse Mother    Hypertension Mother    Mental illness Mother    Cancer Mother        breast and lung   Diabetes Mother    Breast cancer Mother 79   Alcohol abuse Father    Drug abuse Father    Cancer Father        lung   Hypertension Brother    Hearing loss Maternal Aunt    Hypertension Maternal Aunt    Stroke Maternal Aunt    Alzheimer's disease Maternal Aunt    Breast cancer Maternal Aunt        mat great aunt   Cancer Maternal Grandmother        cervical   Heart disease Maternal Grandmother    Hypertension Maternal Grandmother     Social History   Socioeconomic History   Marital status: Married    Spouse name: Not on file   Number of children: Not on file   Years of education: Not on file   Highest education level: Not on file  Occupational History   Not on file  Tobacco Use   Smoking status: Never   Smokeless tobacco: Never  Vaping Use   Vaping Use: Never used  Substance and Sexual Activity   Alcohol use: Not Currently    Alcohol/week: 0.0 standard drinks   Drug use: No   Sexual activity: Yes    Partners: Male    Birth control/protection: None, Surgical  Other Topics Concern   Not on file  Social History Narrative   Not on file   Social Determinants of Health   Financial Resource Strain: Not on file  Food Insecurity: Not on file  Transportation Needs: Not on file  Physical Activity: Not on file  Stress: Not on file  Social Connections: Not on file  Intimate Partner Violence: Not on file    Outpatient Medications Prior to Visit  Medication Sig  Dispense Refill   albuterol (PROAIR HFA) 108 (90 Base) MCG/ACT inhaler Inhale 1-2 puffs into the lungs every 4 (four) hours as needed for wheezing or shortness of breath. 1 each 5   amLODipine (NORVASC) 5 MG tablet TAKE 1 TABLET BY MOUTH EVERY DAY  IN THE EVENING 90 tablet 1   hydrOXYzine (ATARAX/VISTARIL) 10 MG tablet Take 1-2 tablets (10-20 mg total) by mouth every 6 (six) hours as needed for itching. 120 tablet 2   metFORMIN (GLUCOPHAGE-XR) 500 MG 24 hr tablet TAKE 1 TABLET BY MOUTH EVERY DAY WITH BREAKFAST 90 tablet 1   montelukast (SINGULAIR) 10 MG tablet TAKE 1 TABLET BY MOUTH EVERYDAY AT BEDTIME 90 tablet 1   promethazine-dextromethorphan (PROMETHAZINE-DM) 6.25-15 MG/5ML syrup Take 5 mLs by mouth 4 (four) times daily as needed. 118 mL 0   sertraline (ZOLOFT) 25 MG tablet TAKE 1 TABLET (25 MG TOTAL) BY MOUTH DAILY. 90 tablet 2   umeclidinium-vilanterol (ANORO ELLIPTA) 62.5-25 MCG/ACT AEPB Inhale 1 puff into the lungs daily at 6 (six) AM. 60 each 5   No facility-administered medications prior to visit.    Allergies  Allergen Reactions   Meloxicam Other (See Comments)    Felt horrible, headache, bloating, back pain, SHOB; no rash   Zithromax [Azithromycin] Swelling    Tongue    Pseudoephedrine-Guaifenesin Swelling    Of tongue   Shellfish Allergy Rash   Shellfish-Derived Products Rash    ROS Review of Systems  Constitutional:  Negative for chills and fever.  HENT:  Positive for postnasal drip and rhinorrhea. Negative for congestion, sinus pressure, sinus pain and sore throat.   Eyes:  Negative for visual disturbance.  Respiratory:  Negative for cough, shortness of breath and wheezing.   Cardiovascular:  Negative for chest pain.  Gastrointestinal:  Negative for abdominal pain and diarrhea.  Neurological:  Negative for dizziness and headaches.     Objective:    Physical Exam Constitutional:      Appearance: Normal appearance.  HENT:     Head: Normocephalic and atraumatic.   Eyes:     Conjunctiva/sclera: Conjunctivae normal.  Cardiovascular:     Rate and Rhythm: Normal rate and regular rhythm.  Pulmonary:     Effort: Pulmonary effort is normal.     Breath sounds: Normal breath sounds.  Musculoskeletal:     Right lower leg: No edema.     Left lower leg: No edema.  Skin:    General: Skin is warm and dry.     Comments: Small hyperpigmented lesions on the bottom on right foot.   Neurological:     General: No focal deficit present.     Mental Status: She is alert. Mental status is at baseline.  Psychiatric:        Mood and Affect: Mood normal.        Behavior: Behavior normal.    BP 116/72   Pulse (!) 101   Temp 97.8 F (36.6 C)   Resp 18   Ht 5' 6" (1.676 m)   Wt 272 lb 14.4 oz (123.8 kg)   LMP 09/29/2016 Comment: partial  SpO2 98%   BMI 44.05 kg/m   Wt Readings from Last 3 Encounters:  11/01/21 272 lb 14.4 oz (123.8 kg)  09/20/21 271 lb 14.4 oz (123.3 kg)  06/20/21 266 lb 12.1 oz (121 kg)     Health Maintenance Due  Topic Date Due   COVID-19 Vaccine (3 - Booster for Pfizer series) 09/06/2019   PAP SMEAR-Modifier  11/12/2019   Zoster Vaccines- Shingrix (1 of 2) Never done   MAMMOGRAM  07/25/2021    There are no preventive care reminders to display for this patient.  Lab Results  Component Value Date   TSH 1.07 04/19/2018   Lab Results  Component Value  Date   WBC 6.0 05/22/2021   HGB 13.7 05/22/2021   HCT 43.3 05/22/2021   MCV 82.0 05/22/2021   PLT 287 05/22/2021   Lab Results  Component Value Date   NA 138 05/22/2021   K 3.8 05/22/2021   CO2 28 05/22/2021   GLUCOSE 91 05/22/2021   BUN 11 05/22/2021   CREATININE 0.59 05/22/2021   BILITOT 0.3 05/22/2021   ALKPHOS 142 (H) 03/09/2020   AST 36 (H) 05/22/2021   ALT 61 (H) 05/22/2021   PROT 7.6 05/22/2021   ALBUMIN 3.7 (L) 03/09/2020   CALCIUM 9.2 05/22/2021   ANIONGAP 6 09/26/2016   EGFR 110 05/22/2021   Lab Results  Component Value Date   CHOL 214 (H) 02/20/2021    Lab Results  Component Value Date   HDL 42 (L) 02/20/2021   Lab Results  Component Value Date   LDLCALC 156 (H) 02/20/2021   Lab Results  Component Value Date   TRIG 69 02/20/2021   Lab Results  Component Value Date   CHOLHDL 5.1 (H) 02/20/2021   Lab Results  Component Value Date   HGBA1C 6.4 (H) 05/22/2021      Assessment & Plan:   Problem List Items Addressed This Visit       Cardiovascular and Mediastinum   Hypertension, goal below 140/90 (Chronic)    Blood pressure doing well, continue Amlodipine 5 mg daily.          Respiratory   Allergic rhinitis - Primary (Chronic)    Stop Flonase, was given a sample of Ryaltris nasal spray to try. Cannot take Zyrtec (makes her too sleepy) and Allegra and Claritin don't work. Will send Xyzal for allergy symptoms.        Relevant Medications   levocetirizine (XYZAL) 5 MG tablet     Other   Depression with anxiety    Doing much better today, continue Zoloft 25 mg.        Meds ordered this encounter  Medications   levocetirizine (XYZAL) 5 MG tablet    Sig: Take 1 tablet (5 mg total) by mouth every evening.    Dispense:  30 tablet    Refill:  2    Follow-up: Return in about 6 months (around 05/04/2022).    Teodora Medici, DO

## 2021-11-01 ENCOUNTER — Ambulatory Visit
Admission: RE | Admit: 2021-11-01 | Discharge: 2021-11-01 | Disposition: A | Discharge status: Home / Self Care | Payer: BC Managed Care – PPO | Source: Ambulatory Visit | Attending: Family Medicine | Admitting: Family Medicine

## 2021-11-01 ENCOUNTER — Encounter: Payer: Self-pay | Admitting: Internal Medicine

## 2021-11-01 ENCOUNTER — Ambulatory Visit (INDEPENDENT_AMBULATORY_CARE_PROVIDER_SITE_OTHER): Payer: BC Managed Care – PPO | Admitting: Internal Medicine

## 2021-11-01 VITALS — BP 116/72 | HR 101 | Temp 97.8°F | Resp 18 | Ht 66.0 in | Wt 272.9 lb

## 2021-11-01 DIAGNOSIS — I10 Essential (primary) hypertension: Secondary | ICD-10-CM

## 2021-11-01 DIAGNOSIS — F418 Other specified anxiety disorders: Secondary | ICD-10-CM | POA: Diagnosis not present

## 2021-11-01 DIAGNOSIS — Z1231 Encounter for screening mammogram for malignant neoplasm of breast: Secondary | ICD-10-CM | POA: Diagnosis not present

## 2021-11-01 DIAGNOSIS — J302 Other seasonal allergic rhinitis: Secondary | ICD-10-CM | POA: Diagnosis not present

## 2021-11-01 MED ORDER — LEVOCETIRIZINE DIHYDROCHLORIDE 5 MG PO TABS: 5.0000 mg | ORAL_TABLET | Freq: Every evening | ORAL | 2 refills | Status: DC

## 2021-11-01 NOTE — Assessment & Plan Note (Signed)
Doing much better today, continue Zoloft 25 mg.

## 2021-11-01 NOTE — Patient Instructions (Addendum)
It was great seeing you today!  Plan discussed at today's visit: -Continue Zoloft 25 mg -Continue Amlodipine 5 mg daily, continue to check blood pressure  -Try Xyzal for allergies -Try new nasal spray but do not use with Flonase, recommend also using a nasal saline to prevent from drying out/nose bleeds  Follow up in: 6 months   Take care and let us know if you have any questions or concerns prior to your next visit.  Dr. Rosana Berger

## 2021-11-01 NOTE — Assessment & Plan Note (Signed)
Blood pressure doing well, continue Amlodipine 5 mg daily.

## 2021-11-01 NOTE — Assessment & Plan Note (Signed)
Stop Flonase, was given a sample of Ryaltris nasal spray to try. Cannot take Zyrtec (makes her too sleepy) and Allegra and Claritin don't work. Will send Xyzal for allergy symptoms.

## 2021-11-06 ENCOUNTER — Other Ambulatory Visit: Payer: Self-pay | Admitting: Family Medicine

## 2021-11-06 DIAGNOSIS — R928 Other abnormal and inconclusive findings on diagnostic imaging of breast: Secondary | ICD-10-CM

## 2021-11-06 DIAGNOSIS — R921 Mammographic calcification found on diagnostic imaging of breast: Secondary | ICD-10-CM

## 2021-11-21 ENCOUNTER — Ambulatory Visit
Admission: RE | Admit: 2021-11-21 | Discharge: 2021-11-21 | Disposition: A | Discharge status: Home / Self Care | Payer: BC Managed Care – PPO | Source: Ambulatory Visit | Attending: Family Medicine | Admitting: Family Medicine

## 2021-11-21 DIAGNOSIS — R928 Other abnormal and inconclusive findings on diagnostic imaging of breast: Secondary | ICD-10-CM | POA: Diagnosis not present

## 2021-11-21 DIAGNOSIS — R921 Mammographic calcification found on diagnostic imaging of breast: Secondary | ICD-10-CM | POA: Insufficient documentation

## 2021-11-22 ENCOUNTER — Other Ambulatory Visit: Payer: Self-pay | Admitting: Family Medicine

## 2021-11-22 DIAGNOSIS — R928 Other abnormal and inconclusive findings on diagnostic imaging of breast: Secondary | ICD-10-CM

## 2021-11-22 DIAGNOSIS — R921 Mammographic calcification found on diagnostic imaging of breast: Secondary | ICD-10-CM

## 2021-11-28 ENCOUNTER — Encounter: Payer: Self-pay | Admitting: Family Medicine

## 2021-12-02 ENCOUNTER — Ambulatory Visit
Admission: RE | Admit: 2021-12-02 | Discharge: 2021-12-02 | Disposition: A | Discharge status: Home / Self Care | Payer: BC Managed Care – PPO | Source: Ambulatory Visit | Attending: Family Medicine | Admitting: Family Medicine

## 2021-12-02 DIAGNOSIS — R928 Other abnormal and inconclusive findings on diagnostic imaging of breast: Secondary | ICD-10-CM

## 2021-12-02 DIAGNOSIS — N6081 Other benign mammary dysplasias of right breast: Secondary | ICD-10-CM | POA: Diagnosis not present

## 2021-12-02 DIAGNOSIS — R921 Mammographic calcification found on diagnostic imaging of breast: Secondary | ICD-10-CM | POA: Diagnosis not present

## 2021-12-02 HISTORY — PX: BREAST BIOPSY: SHX20

## 2021-12-03 ENCOUNTER — Encounter: Payer: Self-pay | Admitting: *Deleted

## 2021-12-03 DIAGNOSIS — N6081 Other benign mammary dysplasias of right breast: Secondary | ICD-10-CM

## 2021-12-03 LAB — SURGICAL PATHOLOGY

## 2021-12-05 ENCOUNTER — Other Ambulatory Visit: Payer: Self-pay

## 2021-12-05 ENCOUNTER — Ambulatory Visit (INDEPENDENT_AMBULATORY_CARE_PROVIDER_SITE_OTHER): Payer: BC Managed Care – PPO | Admitting: Surgery

## 2021-12-05 ENCOUNTER — Ambulatory Visit: Payer: Self-pay | Admitting: Surgery

## 2021-12-05 ENCOUNTER — Ambulatory Visit: Payer: BC Managed Care – PPO | Admitting: Surgery

## 2021-12-05 ENCOUNTER — Encounter: Payer: Self-pay | Admitting: Surgery

## 2021-12-05 VITALS — BP 135/88 | HR 77 | Temp 98.1°F | Ht 66.0 in | Wt 270.0 lb

## 2021-12-05 DIAGNOSIS — N6081 Other benign mammary dysplasias of right breast: Secondary | ICD-10-CM | POA: Diagnosis not present

## 2021-12-05 NOTE — H&P (View-Only) (Signed)
Patient ID: Alisha Kim, female   DOB: 04-07-1971, 51 y.o.   MRN: [id number]  Chief Complaint: Flat epithelial atypia, right breast  History of Present Illness Alisha Kim is a 51 y.o. female with a screening mammogram which revealed some pleomorphic calcifications in the upper inner quadrant of the right breast both anteriorly and posteriorly expanding in the area of 3.5 cm.  Both anterior and posterior extents of the calcifications were biopsied with stereotactic approach, both revealing flat epithelial atypia. She has never utilized birth control or other hormonal therapy.  She has had a hysterectomy for endometriosis and fibroid tumors.  Her mother had breast cancer at the age of 27.  She is gravida 1 para 1.  Her child is 73 years of age.  No palpable lumps, nipple discharge, breast skin changes, breast pain.  She reports she does monthly breast exams and follows up with her annual mammography.  Past Medical History Past Medical History:  Diagnosis Date   Adenomyosis 09/2015   Allergy    Anemia    history of   Anxiety    controlled;    Asthma    Depression with anxiety    Diabetes mellitus without complication [facility])    was told she was pre-diabetic   Fibroids 08/14/2016   [place] March 6195   History of Helicobacter pylori infection 08/08/2016   2004   Hydrosalpinx 08/14/2016   right   Hypertension    controlled with medication;    Mixed hyperlipidemia 07/09/2020   Pre-diabetes       Past Surgical History:  Procedure Laterality Date   ABDOMINAL HYSTERECTOMY     BREAST BIOPSY Right 12/02/2021   Stereo Bx, Coil Clip, path pending   BREAST BIOPSY Right 12/02/2021   Stereo Bx, X clip, path pending   COLONOSCOPY WITH PROPOFOL N/A 05/17/2018   Procedure: COLONOSCOPY WITH PROPOFOL;  Surgeon: Lin Landsman, MD;  Location: [place];  Service: Gastroenterology;  Laterality: N/A;   DENTAL SURGERY     NO PAST SURGERIES     TONSILLECTOMY     VAGINAL HYSTERECTOMY  Bilateral 09/29/2016   Procedure: HYSTERECTOMY VAGINAL WITH BILATERAL SALPINGECTOMY;  Surgeon: [name], MD;  Location: [facility];  Service: Gynecology;  Laterality: Bilateral;    Allergies  Allergen Reactions   Meloxicam Other (See Comments)    Felt horrible, headache, bloating, back pain, SHOB; no rash   Zithromax [Azithromycin] Swelling    Tongue    Pseudoephedrine-Guaifenesin Swelling    Of tongue   Shellfish Allergy Rash   Shellfish-Derived Products Rash    Current Outpatient Medications  Medication Sig Dispense Refill   albuterol (PROAIR HFA) 108 (90 Base) MCG/ACT inhaler Inhale 1-2 puffs into the lungs every 4 (four) hours as needed for wheezing or shortness of breath. 1 each 5   amLODipine (NORVASC) 5 MG tablet TAKE 1 TABLET BY MOUTH EVERY DAY IN THE EVENING 90 tablet 1   hydrOXYzine (ATARAX/VISTARIL) 10 MG tablet Take 1-2 tablets (10-20 mg total) by mouth every 6 (six) hours as needed for itching. 120 tablet 2   levocetirizine (XYZAL) 5 MG tablet Take 1 tablet (5 mg total) by mouth every evening. 30 tablet 2   metFORMIN (GLUCOPHAGE-XR) 500 MG 24 hr tablet TAKE 1 TABLET BY MOUTH EVERY DAY WITH BREAKFAST 90 tablet 1   sertraline (ZOLOFT) 25 MG tablet TAKE 1 TABLET (25 MG TOTAL) BY MOUTH DAILY. 90 tablet 2   umeclidinium-vilanterol (ANORO ELLIPTA) 62.5-25 MCG/ACT AEPB Inhale 1 puff into  the lungs daily at 6 (six) AM. 60 each 5   No current facility-administered medications for this visit.    Family History Family History  Problem Relation Age of Onset   Alcohol abuse Mother    Arthritis Mother    Asthma Mother    Depression Mother    Drug abuse Mother    Hypertension Mother    Mental illness Mother    Cancer Mother        breast and lung   Diabetes Mother    Breast cancer Mother 76   Alcohol abuse Father    Drug abuse Father    Cancer Father        lung   Hypertension Brother    Hearing loss Maternal Aunt    Hypertension Maternal Aunt    Stroke Maternal Aunt     Alzheimer's disease Maternal Aunt    Breast cancer Maternal Aunt        mat great aunt   Cancer Maternal Grandmother        cervical   Heart disease Maternal Grandmother    Hypertension Maternal Grandmother       Social History Social History   Tobacco Use   Smoking status: Never   Smokeless tobacco: Never  Vaping Use   Vaping Use: Never used  Substance Use Topics   Alcohol use: Not Currently    Alcohol/week: 0.0 standard drinks of alcohol   Drug use: No        Review of Systems  All other systems reviewed and are negative.   Physical Exam Blood pressure 135/88, pulse 77, temperature 98.1 F (36.7 C), temperature source Oral, height '5\' 6"'$  (1.676 m), weight 270 lb (122.5 kg), last menstrual period 09/29/2016, SpO2 98 %. Last Weight  Most recent update: 12/05/2021  3:51 PM    Weight  122.5 kg (270 lb)             CONSTITUTIONAL: Well developed, and nourished, appropriately responsive and aware without distress.   EYES: Sclera non-icteric.   EARS, NOSE, MOUTH AND THROAT: The oropharynx is clear. Oral mucosa is pink and moist.  Hearing is intact to voice.  NECK: Trachea is midline, and there is no jugular venous distension.  LYMPH NODES:  Lymph nodes in the neck are not enlarged. RESPIRATORY:  Lungs are clear, and breath sounds are equal bilaterally. Normal respiratory effort without pathologic use of accessory muscles. CARDIOVASCULAR: Heart is regular in rate and rhythm. GI: The abdomen is soft, nontender, and nondistended.  MUSCULOSKELETAL:  Symmetrical muscle tone appreciated in all four extremities.    SKIN: Skin turgor is normal. No pathologic skin lesions appreciated.  NEUROLOGIC:  Motor and sensation appear grossly normal.  Cranial nerves are grossly without defect. PSYCH:  Alert and oriented to person, place and time. Affect is appropriate for situation.  Data Reviewed I have personally reviewed what is currently available of the patient's imaging,  recent labs and medical records.   Labs:     Latest Ref Rng & Units 05/22/2021    2:53 PM 03/19/2020    2:13 PM 05/18/2018    9:12 AM  CBC  WBC 3.8 - 10.8 Thousand/uL 6.0  4.6  4.7   Hemoglobin 11.7 - 15.5 g/dL 13.7  13.6  12.4   Hematocrit 35.0 - 45.0 % 43.3  42.0  39.7   Platelets 140 - 400 Thousand/uL 287  269  261       Latest Ref Rng & Units 05/22/2021  2:53 PM 02/20/2021    3:41 PM 07/09/2020    4:32 PM  CMP  Glucose 65 - 99 mg/dL 91  120  97   BUN 7 - 25 mg/dL '11  10  8   '$ Creatinine 0.50 - 1.03 mg/dL 0.59  0.67  0.68   Sodium 135 - 146 mmol/L 138  139  137   Potassium 3.5 - 5.3 mmol/L 3.8  3.9  3.9   Chloride 98 - 110 mmol/L 103  103  102   CO2 20 - 32 mmol/L '28  31  28   '$ Calcium 8.6 - 10.4 mg/dL 9.2  9.4  9.2   Total Protein 6.1 - 8.1 g/dL 7.6   7.3   Total Bilirubin 0.2 - 1.2 mg/dL 0.3   0.2   AST 10 - 35 U/L 36   20   ALT 6 - 29 U/L 61   27    SURGICAL PATHOLOGY  CASE: [id number]  PATIENT: Rexford Maus  Surgical Pathology Report   Specimen Submitted:  A. Breast, right anterior  B. Breast, right posterior   Clinical History: CALCS seen at [facility].  1, 2 PROB DCIS. A - Coil-shaped  clip placed following stereotactic biopsy of RIGHT breast, upper inner  quadrant, anterior. B - X-shaped clip placed following stereotactic  biopsy of RIGHT breast, upper inner quadrant, posterior.   DIAGNOSIS:  A. 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  - BACKGROUND BENIGN MAMMARY PARENCHYMA WITH MILD STROMAL FIBROSIS,  FIBROCYSTIC CHANGES, AND FOCAL BENIGN ADENOSIS.  - NEGATIVE FOR DUCTAL CARCINOMA IN SITU AND MALIGNANCY.   B. 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  - BACKGROUND BENIGN  MAMMARY PARENCHYMA WITH MILD STROMAL FIBROSIS AND  FIBROCYSTIC CHANGES.  - NEGATIVE FOR DEFINITE DUCTAL CARCINOMA IN SITU AND MALIGNANCY.   Comment:  The majority of the atypical changes in B are compatible with standard  flat epithelial atypia (FEA). However, one block (B4) displays an  underlying pagetoid epithelial proliferation that borders on, but is not  diagnostic of ductal carcinoma in situ. The findings are concerning for  potential low grade DCIS associated elsewhere within this FEA. Clinical  and radiographic correlation is essential.    Imaging: Radiology review:  CLINICAL DATA:  Patient recalled from screening for right breast calcifications.   EXAM: DIGITAL DIAGNOSTIC UNILATERAL RIGHT MAMMOGRAM WITH TOMOSYNTHESIS AND CAD   TECHNIQUE: Right digital diagnostic mammography and breast tomosynthesis was performed. The images were evaluated with computer-aided detection.   COMPARISON:  Previous exam(s).   ACR Breast Density Category c: The breast tissue is heterogeneously dense, which may obscure small masses.   FINDINGS: Within the superior slightly medial right breast middle depth there is a 3.5 cm linearly oriented group of pleomorphic calcifications.   IMPRESSION: Suspicious right breast calcifications.   RECOMMENDATION: Stereotactic guided core needle biopsy of the anterior and posterior aspect of the suspicious right breast calcifications.   I have discussed the findings and recommendations with the patient. If applicable, a reminder letter will be sent to the patient regarding the next appointment.   BI-RADS CATEGORY  4: Suspicious.     Electronically Signed   By: Lovey Newcomer M.D.   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 12/05/2021   Elevated alkaline phosphatase level 05/22/2021   Vitamin D deficiency 05/22/2021   Mixed hyperlipidemia 07/09/2020    Current moderate episode of major depressive disorder ([place]) 02/17/2020   Grief reaction 02/17/2020   Stress reaction 02/17/2020   Morbid obesity ([place]) 05/31/2018   Colon cancer screening    Allergic rhinitis 03/01/2018   Cervical stenosis of spinal canal 08/28/2017   S/P vaginal hysterectomy 46/65/9935   History of Helicobacter pylori infection 08/08/2016   Microcytosis 08/01/2016   Heel spur, left 04/07/2016   Bilateral foot pain 04/04/2016   Anxiety 04/04/2016   Hypertension, goal below 140/90 07/24/2015   Bilateral hip pain 11/17/2014   Atopic dermatitis 11/17/2014   Low serum vitamin D 11/17/2014   Pre-diabetes 11/17/2014   Depression with anxiety 11/17/2014   Mild persistent asthma with allergic rhinitis without complication 70/17/7939    Plan     This will likely require bracketed/2 RF ID tag to localization right breast excisional biopsy. We discussed the potential of upstaging her diagnosis. Risks of anesthesia, bleeding, need for additional surgery based upon margin status, infection, recurrence.  Best case scenario would anticipate estrogen blockade to diminish her risk, worst would be dependent upon the upstaging of the disease process. Questions reviewed and answered.  No guarantees ever expressed or implied. Face-to-face time spent with the patient and accompanying care providers(if present) was 40 minutes, with more than 50% of the time spent counseling, educating, and coordinating care of the patient.    These notes generated with voice recognition software. I apologize for typographical errors.  Ronny Bacon M.D., FACS 12/05/2021, 7:49 PM

## 2021-12-05 NOTE — Patient Instructions (Signed)
Our surgery scheduler will call you within 24-48 hours to schedule your surgery. Please have the [facility] surgery sheet available when speaking with her.    Lumpectomy  A lumpectomy, sometimes called a partial mastectomy, is surgery to remove a cancerous tumor or mass (the lump) from a breast. It is a form of breast-conserving or breast-preservation surgery. This means that the cancerous tissue is removed but the breast remains intact. During a lumpectomy, the portion of the breast that contains the tumor is removed. Some normal tissue around the lump may be taken out to make sure that all of the tumor has been removed. Lymph nodes under your arm may also be removed and tested to find out if the cancer has spread. Lymph nodes are part of the body's disease-fighting system (immune system) and are usually the first place where breast cancer spreads. Tell a health care provider about: Any allergies you have. All medicines you are taking, including vitamins, herbs, eye drops, creams, and over-the-counter medicines. Any problems you or family members have had with anesthetic medicines. Any blood disorders you have. Any surgeries you have had. Any medical conditions you have. Whether you are pregnant or may be pregnant. What are the risks? Generally, this is a safe procedure. However, problems may occur, including: Bleeding. Infection. Allergic reaction to medicines. Pain, swelling, weakness, or numbness in the arm on the side of your surgery. Temporary swelling. Change in the shape of the breast, particularly if a large portion is removed. Scar tissue that forms at the surgical site and feels hard to the touch. Blood clots. What happens before the procedure? Staying hydrated Follow instructions from your health care provider about hydration, which may include: Up to 2 hours before the procedure - you may continue to drink clear liquids, such as water, clear fruit juice, black coffee, and plain  tea.  Eating and drinking restrictions Follow instructions from your health care provider about eating and drinking, which may include: 8 hours before the procedure - stop eating heavy meals or foods, such as meat, fried foods, or fatty foods. 6 hours before the procedure - stop eating light meals or foods, such as toast or cereal. 6 hours before the procedure - stop drinking milk or drinks that contain milk. 2 hours before the procedure - stop drinking clear liquids. Medicines Ask your health care provider about: Changing or stopping your regular medicines. This is especially important if you are taking diabetes medicines or blood thinners. Taking medicines such as aspirin and ibuprofen. These medicines can thin your blood. Do not take these medicines unless your health care provider tells you to take them. Taking over-the-counter medicines, vitamins, herbs, and supplements. General instructions Prior to surgery, your health care provider may do a procedure to locate and mark the tumor area in your breast (localization). This will help guide your surgeon to where the incision will be made. This may be done with: Imaging, such as a mammogram, ultrasound, or MRI. Insertion of a small wire, clip, or seed, or an implant that will reflect a radar signal. You may have screening tests or exams to get baseline measurements of your arm. These can be compared to measurements done after surgery to monitor for swelling (lymphedema) that can develop after having lymph nodes removed. Ask your health care provider: How your surgery site will be marked. What steps will be taken to help prevent infection. These may include: Washing skin with a germ-killing soap. Taking antibiotic medicine. Plan to have someone take  you home from the hospital or clinic. Plan to have a responsible adult care for you for at least 24 hours after you leave the hospital or clinic. This is important. What happens during the  procedure?  An IV will be inserted into one of your veins. You will be given one or more of the following: A medicine to help you relax (sedative). A medicine to numb the area (local anesthetic). A medicine to make you fall asleep (general anesthetic). Your health care provider will use a kind of electric scalpel that uses heat to reduce bleeding (electrocautery knife). A curved incision that follows the natural curve of your breast will be made. This type of incision will allow for minimal scarring and better healing. The tumor will be removed along with some of the tissue around it. This will be sent to the lab for testing. Your health care provider may also remove lymph nodes at this time if needed. If the tumor is close to the muscles over your chest, some muscle tissue may also be removed. A small drain tube may be inserted into your breast area or armpit to collect fluid that may build up after surgery. This tube will be connected to a suction bulb on the outside of your body to remove the fluid. The incision will be closed with stitches (sutures). A bandage (dressing) may be placed over the incision. The procedure may vary among health care providers and hospitals. What happens after the procedure? Your blood pressure, heart rate, breathing rate, and blood oxygen level will be monitored until you leave the hospital or clinic. You will be given medicine for pain as needed. Your IV will be removed when you are able to eat and drink by mouth. You will be encouraged to get up and walk as soon as you can. This is important to improve blood flow and breathing. Ask for help if you feel weak or unsteady. You may have: A drain tube in place for 2-3 days to prevent a collection of blood (hematoma) from developing in the breast. You will be given instructions about caring for the drain before you go home. A pressure bandage applied for 1-2 days to prevent bleeding or swelling. Your pressure bandage  may look like a thick piece of fabric or an elastic wrap. Ask your health care provider how to care for your bandage at home. You may be given a tight sleeve to wear over your arm on the side of your surgery. You should wear this sleeve as told by your health care provider. Do not drive for 24 hours if you were given a sedative during your procedure. Summary A lumpectomy, sometimes called a partial mastectomy, is surgery to remove a cancerous tumor or mass (the lump) from a breast. During a lumpectomy, the portion of the breast that contains the tumor is removed. Lymph nodes under your arm may also be removed and tested to find out if the cancer has spread. Plan to have someone take you home from the hospital or clinic. You may have a drain tube in place for 2-3 days to prevent a collection of blood (hematoma) from developing in the breast. You will be given instructions about caring for the drain before you go home. This information is not intended to replace advice given to you by your health care provider. Make sure you discuss any questions you have with your health care provider. Document Revised: 11/29/2018 Document Reviewed: 11/29/2018 Elsevier Patient Education  [place].

## 2021-12-05 NOTE — Progress Notes (Signed)
Patient ID: Alisha Kim, female   DOB: 1970/08/07, 51 y.o.   MRN: [id number]  Chief Complaint: Flat epithelial atypia, right breast  History of Present Illness Alisha Kim is a 51 y.o. female with a screening mammogram which revealed some pleomorphic calcifications in the upper inner quadrant of the right breast both anteriorly and posteriorly expanding in the area of 3.5 cm.  Both anterior and posterior extents of the calcifications were biopsied with stereotactic approach, both revealing flat epithelial atypia. She has never utilized birth control or other hormonal therapy.  She has had a hysterectomy for endometriosis and fibroid tumors.  Her mother had breast cancer at the age of 38.  She is gravida 1 para 1.  Her child is 70 years of age.  No palpable lumps, nipple discharge, breast skin changes, breast pain.  She reports she does monthly breast exams and follows up with her annual mammography.  Past Medical History Past Medical History:  Diagnosis Date   Adenomyosis 09/2015   Allergy    Anemia    history of   Anxiety    controlled;    Asthma    Depression with anxiety    Diabetes mellitus without complication [facility])    was told she was pre-diabetic   Fibroids 08/14/2016   [place] March 5176   History of Helicobacter pylori infection 08/08/2016   2004   Hydrosalpinx 08/14/2016   right   Hypertension    controlled with medication;    Mixed hyperlipidemia 07/09/2020   Pre-diabetes       Past Surgical History:  Procedure Laterality Date   ABDOMINAL HYSTERECTOMY     BREAST BIOPSY Right 12/02/2021   Stereo Bx, Coil Clip, path pending   BREAST BIOPSY Right 12/02/2021   Stereo Bx, X clip, path pending   COLONOSCOPY WITH PROPOFOL N/A 05/17/2018   Procedure: COLONOSCOPY WITH PROPOFOL;  Surgeon: Lin Landsman, MD;  Location: [facility];  Service: Gastroenterology;  Laterality: N/A;   DENTAL SURGERY     NO PAST SURGERIES     TONSILLECTOMY     VAGINAL HYSTERECTOMY  Bilateral 09/29/2016   Procedure: HYSTERECTOMY VAGINAL WITH BILATERAL SALPINGECTOMY;  Surgeon: [name], MD;  Location: [facility];  Service: Gynecology;  Laterality: Bilateral;    Allergies  Allergen Reactions   Meloxicam Other (See Comments)    Felt horrible, headache, bloating, back pain, SHOB; no rash   Zithromax [Azithromycin] Swelling    Tongue    Pseudoephedrine-Guaifenesin Swelling    Of tongue   Shellfish Allergy Rash   Shellfish-Derived Products Rash    Current Outpatient Medications  Medication Sig Dispense Refill   albuterol (PROAIR HFA) 108 (90 Base) MCG/ACT inhaler Inhale 1-2 puffs into the lungs every 4 (four) hours as needed for wheezing or shortness of breath. 1 each 5   amLODipine (NORVASC) 5 MG tablet TAKE 1 TABLET BY MOUTH EVERY DAY IN THE EVENING 90 tablet 1   hydrOXYzine (ATARAX/VISTARIL) 10 MG tablet Take 1-2 tablets (10-20 mg total) by mouth every 6 (six) hours as needed for itching. 120 tablet 2   levocetirizine (XYZAL) 5 MG tablet Take 1 tablet (5 mg total) by mouth every evening. 30 tablet 2   metFORMIN (GLUCOPHAGE-XR) 500 MG 24 hr tablet TAKE 1 TABLET BY MOUTH EVERY DAY WITH BREAKFAST 90 tablet 1   sertraline (ZOLOFT) 25 MG tablet TAKE 1 TABLET (25 MG TOTAL) BY MOUTH DAILY. 90 tablet 2   umeclidinium-vilanterol (ANORO ELLIPTA) 62.5-25 MCG/ACT AEPB Inhale 1 puff into  the lungs daily at 6 (six) AM. 60 each 5   No current facility-administered medications for this visit.    Family History Family History  Problem Relation Age of Onset   Alcohol abuse Mother    Arthritis Mother    Asthma Mother    Depression Mother    Drug abuse Mother    Hypertension Mother    Mental illness Mother    Cancer Mother        breast and lung   Diabetes Mother    Breast cancer Mother 72   Alcohol abuse Father    Drug abuse Father    Cancer Father        lung   Hypertension Brother    Hearing loss Maternal Aunt    Hypertension Maternal Aunt    Stroke Maternal Aunt     Alzheimer's disease Maternal Aunt    Breast cancer Maternal Aunt        mat great aunt   Cancer Maternal Grandmother        cervical   Heart disease Maternal Grandmother    Hypertension Maternal Grandmother       Social History Social History   Tobacco Use   Smoking status: Never   Smokeless tobacco: Never  Vaping Use   Vaping Use: Never used  Substance Use Topics   Alcohol use: Not Currently    Alcohol/week: 0.0 standard drinks of alcohol   Drug use: No        Review of Systems  All other systems reviewed and are negative.   Physical Exam Blood pressure 135/88, pulse 77, temperature 98.1 F (36.7 C), temperature source Oral, height '5\' 6"'$  (1.676 m), weight 270 lb (122.5 kg), last menstrual period 09/29/2016, SpO2 98 %. Last Weight  Most recent update: 12/05/2021  3:51 PM    Weight  122.5 kg (270 lb)             CONSTITUTIONAL: Well developed, and nourished, appropriately responsive and aware without distress.   EYES: Sclera non-icteric.   EARS, NOSE, MOUTH AND THROAT: The oropharynx is clear. Oral mucosa is pink and moist.  Hearing is intact to voice.  NECK: Trachea is midline, and there is no jugular venous distension.  LYMPH NODES:  Lymph nodes in the neck are not enlarged. RESPIRATORY:  Lungs are clear, and breath sounds are equal bilaterally. Normal respiratory effort without pathologic use of accessory muscles. CARDIOVASCULAR: Heart is regular in rate and rhythm. GI: The abdomen is soft, nontender, and nondistended.  MUSCULOSKELETAL:  Symmetrical muscle tone appreciated in all four extremities.    SKIN: Skin turgor is normal. No pathologic skin lesions appreciated.  NEUROLOGIC:  Motor and sensation appear grossly normal.  Cranial nerves are grossly without defect. PSYCH:  Alert and oriented to person, place and time. Affect is appropriate for situation.  Data Reviewed I have personally reviewed what is currently available of the patient's imaging,  recent labs and medical records.   Labs:     Latest Ref Rng & Units 05/22/2021    2:53 PM 03/19/2020    2:13 PM 05/18/2018    9:12 AM  CBC  WBC 3.8 - 10.8 Thousand/uL 6.0  4.6  4.7   Hemoglobin 11.7 - 15.5 g/dL 13.7  13.6  12.4   Hematocrit 35.0 - 45.0 % 43.3  42.0  39.7   Platelets 140 - 400 Thousand/uL 287  269  261       Latest Ref Rng & Units 05/22/2021  2:53 PM 02/20/2021    3:41 PM 07/09/2020    4:32 PM  CMP  Glucose 65 - 99 mg/dL 91  120  97   BUN 7 - 25 mg/dL '11  10  8   '$ Creatinine 0.50 - 1.03 mg/dL 0.59  0.67  0.68   Sodium 135 - 146 mmol/L 138  139  137   Potassium 3.5 - 5.3 mmol/L 3.8  3.9  3.9   Chloride 98 - 110 mmol/L 103  103  102   CO2 20 - 32 mmol/L '28  31  28   '$ Calcium 8.6 - 10.4 mg/dL 9.2  9.4  9.2   Total Protein 6.1 - 8.1 g/dL 7.6   7.3   Total Bilirubin 0.2 - 1.2 mg/dL 0.3   0.2   AST 10 - 35 U/L 36   20   ALT 6 - 29 U/L 61   27    SURGICAL PATHOLOGY  CASE: [id number]  PATIENT: Rexford Maus  Surgical Pathology Report   Specimen Submitted:  A. Breast, right anterior  B. Breast, right posterior   Clinical History: CALCS seen at [facility].  1, 2 PROB DCIS. A - Coil-shaped  clip placed following stereotactic biopsy of RIGHT breast, upper inner  quadrant, anterior. B - X-shaped clip placed following stereotactic  biopsy of RIGHT breast, upper inner quadrant, posterior.   DIAGNOSIS:  A. 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  - BACKGROUND BENIGN MAMMARY PARENCHYMA WITH MILD STROMAL FIBROSIS,  FIBROCYSTIC CHANGES, AND FOCAL BENIGN ADENOSIS.  - NEGATIVE FOR DUCTAL CARCINOMA IN SITU AND MALIGNANCY.   B. 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  - BACKGROUND BENIGN  MAMMARY PARENCHYMA WITH MILD STROMAL FIBROSIS AND  FIBROCYSTIC CHANGES.  - NEGATIVE FOR DEFINITE DUCTAL CARCINOMA IN SITU AND MALIGNANCY.   Comment:  The majority of the atypical changes in B are compatible with standard  flat epithelial atypia (FEA). However, one block (B4) displays an  underlying pagetoid epithelial proliferation that borders on, but is not  diagnostic of ductal carcinoma in situ. The findings are concerning for  potential low grade DCIS associated elsewhere within this FEA. Clinical  and radiographic correlation is essential.    Imaging: Radiology review:  CLINICAL DATA:  Patient recalled from screening for right breast calcifications.   EXAM: DIGITAL DIAGNOSTIC UNILATERAL RIGHT MAMMOGRAM WITH TOMOSYNTHESIS AND CAD   TECHNIQUE: Right digital diagnostic mammography and breast tomosynthesis was performed. The images were evaluated with computer-aided detection.   COMPARISON:  Previous exam(s).   ACR Breast Density Category c: The breast tissue is heterogeneously dense, which may obscure small masses.   FINDINGS: Within the superior slightly medial right breast middle depth there is a 3.5 cm linearly oriented group of pleomorphic calcifications.   IMPRESSION: Suspicious right breast calcifications.   RECOMMENDATION: Stereotactic guided core needle biopsy of the anterior and posterior aspect of the suspicious right breast calcifications.   I have discussed the findings and recommendations with the patient. If applicable, a reminder letter will be sent to the patient regarding the next appointment.   BI-RADS CATEGORY  4: Suspicious.     Electronically Signed   By: Lovey Newcomer M.D.   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 12/05/2021   Elevated alkaline phosphatase level 05/22/2021   Vitamin D deficiency 05/22/2021   Mixed hyperlipidemia 07/09/2020    Current moderate episode of major depressive disorder ([place]) 02/17/2020   Grief reaction 02/17/2020   Stress reaction 02/17/2020   Morbid obesity ([place]) 05/31/2018   Colon cancer screening    Allergic rhinitis 03/01/2018   Cervical stenosis of spinal canal 08/28/2017   S/P vaginal hysterectomy 24/26/8341   History of Helicobacter pylori infection 08/08/2016   Microcytosis 08/01/2016   Heel spur, left 04/07/2016   Bilateral foot pain 04/04/2016   Anxiety 04/04/2016   Hypertension, goal below 140/90 07/24/2015   Bilateral hip pain 11/17/2014   Atopic dermatitis 11/17/2014   Low serum vitamin D 11/17/2014   Pre-diabetes 11/17/2014   Depression with anxiety 11/17/2014   Mild persistent asthma with allergic rhinitis without complication 96/22/2979    Plan     This will likely require bracketed/2 RF ID tag to localization right breast excisional biopsy. We discussed the potential of upstaging her diagnosis. Risks of anesthesia, bleeding, need for additional surgery based upon margin status, infection, recurrence.  Best case scenario would anticipate estrogen blockade to diminish her risk, worst would be dependent upon the upstaging of the disease process. Questions reviewed and answered.  No guarantees ever expressed or implied. Face-to-face time spent with the patient and accompanying care providers(if present) was 40 minutes, with more than 50% of the time spent counseling, educating, and coordinating care of the patient.    These notes generated with voice recognition software. I apologize for typographical errors.  Ronny Bacon M.D., FACS 12/05/2021, 7:49 PM

## 2021-12-11 ENCOUNTER — Other Ambulatory Visit: Payer: Self-pay | Admitting: Surgery

## 2021-12-11 DIAGNOSIS — R928 Other abnormal and inconclusive findings on diagnostic imaging of breast: Secondary | ICD-10-CM

## 2021-12-12 ENCOUNTER — Telehealth: Payer: Self-pay | Admitting: Surgery

## 2021-12-12 ENCOUNTER — Other Ambulatory Visit: Payer: Self-pay | Admitting: Surgery

## 2021-12-12 DIAGNOSIS — R928 Other abnormal and inconclusive findings on diagnostic imaging of breast: Secondary | ICD-10-CM

## 2021-12-12 NOTE — Telephone Encounter (Signed)
Patient has been advised of Pre-Admission date/time, and Surgery date.  Surgery Date: 12/25/21 Preadmission Testing Date: 12/17/21 (phone 8a-1p)  Patient has been made aware to call [phone], between 1-3:00pm the day before surgery, to find out what time to arrive for surgery.    Also patient reminded of her RF tag placement with the [facility] on 12/19/21.  Patient verbalized understanding with all.

## 2021-12-15 ENCOUNTER — Other Ambulatory Visit: Payer: Self-pay | Admitting: Internal Medicine

## 2021-12-15 DIAGNOSIS — J302 Other seasonal allergic rhinitis: Secondary | ICD-10-CM

## 2021-12-16 NOTE — Telephone Encounter (Signed)
Requested Prescriptions  Pending Prescriptions Disp Refills  . levocetirizine (XYZAL) 5 MG tablet [Pharmacy Med Name: LEVOCETIRIZINE 5 MG TABLET] 90 tablet 0    Sig: TAKE 1 TABLET BY MOUTH EVERY DAY IN THE EVENING     Ear, Nose, and Throat:  Antihistamines - levocetirizine dihydrochloride Passed - 12/15/2021  1:59 PM      Passed - Cr in normal range and within 360 days    Creat  Date Value Ref Range Status  05/22/2021 0.59 0.50 - 1.03 mg/dL Final         Passed - eGFR is 10 or above and within 360 days    GFR, Est African American  Date Value Ref Range Status  07/09/2020 119 > OR = 60 mL/min/1.17m Final   GFR, Est Non African American  Date Value Ref Range Status  07/09/2020 103 > OR = 60 mL/min/1.750mFinal   eGFR  Date Value Ref Range Status  05/22/2021 110 > OR = 60 mL/min/1.7364minal    Comment:    The eGFR is based on the CKD-EPI 2021 equation. To calculate  the new eGFR from a previous Creatinine or Cystatin C result, go to https://www.kidney.org/professionals/ kdoqi/gfr%5Fcalculator          Passed - Valid encounter within last 12 months    Recent Outpatient Visits          1 month ago Seasonal allergic rhinitis, unspecified trigger   [facility]   2 months ago Hypertension, goal below 140/90   [facility]   6 months ago Hypertension, goal below 140/90   [facility]   9 months ago Mild persistent asthma with allergic rhinitis without complication   [facility]   1 year ago Bilateral carpal tunnel syndrome   [facility]      Future Appointments            In 4 months [facility]

## 2021-12-17 ENCOUNTER — Encounter
Admission: RE | Admit: 2021-12-17 | Discharge: 2021-12-17 | Disposition: A | Discharge status: Home / Self Care | Payer: BC Managed Care – PPO | Source: Ambulatory Visit | Attending: Surgery | Admitting: Surgery

## 2021-12-17 ENCOUNTER — Other Ambulatory Visit: Payer: Self-pay

## 2021-12-17 DIAGNOSIS — Z01812 Encounter for preprocedural laboratory examination: Secondary | ICD-10-CM

## 2021-12-17 HISTORY — DX: Personal history of urinary calculi: Z87.442

## 2021-12-17 NOTE — Patient Instructions (Addendum)
Your procedure is scheduled on: -12/25/21- Wednesday Report to the Registration Desk on the 1st floor of the [facility]. To find out your arrival time, please call [phone] between 1PM - 3PM on: 12/24/21 - Tuesday If your arrival time is 6:00 am, do not arrive prior to that time as the [facility] entrance doors do not open until 6:00 am.  REMEMBER: Instructions that are not followed completely may result in serious medical risk, up to and including death; or upon the discretion of your surgeon and anesthesiologist your surgery may need to be rescheduled.  Do not eat food after midnight the night before surgery.  No gum chewing, lozengers or hard candies.  You may however, drink CLEAR liquids up to 2 hours before you are scheduled to arrive for your surgery. Do not drink anything within 2 hours of your scheduled arrival time.  Clear liquids include: - water  - apple juice without pulp - gatorade (not RED colors) - black coffee or tea (Do NOT add milk or creamers to the coffee or tea) Do NOT drink anything that is not on this list.  Type 1 and Type 2 diabetics should only drink water.  TAKE THESE MEDICATIONS THE MORNING OF SURGERY WITH A SIP OF WATER:  - sertraline (ZOLOFT) 25 MG tablet - amLODipine (NORVASC) 5 MG tablet - umeclidinium-vilanterol (ANORO ELLIPTA) 62.5-25 MCG/ACT AEPB  Use albuterol (PROAIR HFA) 108 (90 Base) MCG/ACT inhaler on the day of surgery and bring to the hospital.  Stop taking metFORMIN (GLUCOPHAGE-XR) 500 MG 24 hr tablet beginning 12/23/21 , resume taking the day after your surgery.  One week prior to surgery: Stop Anti-inflammatories (NSAIDS) such as Advil, Aleve, Ibuprofen, Motrin, Naproxen, Naprosyn and Aspirin based products such as Excedrin, Goodys Powder, BC Powder.  Stop ANY OVER THE COUNTER supplements until after surgery.  You may take Tylenol if needed for pain up until the day of surgery.  No Alcohol for 24 hours before or after  surgery.  No Smoking including e-cigarettes for 24 hours prior to surgery.  No chewable tobacco products for at least 6 hours prior to surgery.  No nicotine patches on the day of surgery.  Do not use any "recreational" drugs for at least a week prior to your surgery.  Please be advised that the combination of cocaine and anesthesia may have negative outcomes, up to and including death. If you test positive for cocaine, your surgery will be cancelled.  On the morning of surgery brush your teeth with toothpaste and water, you may rinse your mouth with mouthwash if you wish. Do not swallow any toothpaste or mouthwash.  Use CHG Soap or wipes as directed on instruction sheet.  Do not wear jewelry, make-up, hairpins, clips or nail polish.  Do not wear lotions, powders, or perfumes.   Do not shave body from the neck down 48 hours prior to surgery just in case you cut yourself which could leave a site for infection.  Also, freshly shaved skin may become irritated if using the CHG soap.  Contact lenses, hearing aids and dentures may not be worn into surgery.  Do not bring valuables to the hospital. [facility] is not responsible for any missing/lost belongings or valuables.   Notify your doctor if there is any change in your medical condition (cold, fever, infection).  Wear comfortable clothing (specific to your surgery type) to the hospital.  After surgery, you can help prevent lung complications by doing breathing exercises.  Take deep breaths  and cough every 1-2 hours. Your doctor may order a device called an Incentive Spirometer to help you take deep breaths. When coughing or sneezing, hold a pillow firmly against your incision with both hands. This is called "splinting." Doing this helps protect your incision. It also decreases belly discomfort.  If you are being admitted to the hospital overnight, leave your suitcase in the car. After surgery it may be brought to your room.  If you  are being discharged the day of surgery, you will not be allowed to drive home. You will need a responsible adult (18 years or older) to drive you home and stay with you that night.   If you are taking public transportation, you will need to have a responsible adult (18 years or older) with you. Please confirm with your physician that it is acceptable to use public transportation.   Please call the [facility] at [phone] if you have any questions about these instructions.  Surgery Visitation Policy:  Patients undergoing a surgery or procedure may have two family members or support persons with them as long as the person is not COVID-19 positive or experiencing its symptoms.   Inpatient Visitation:    Visiting hours are 7 a.m. to 8 p.m. Up to four visitors are allowed at one time in a patient room, including children. The visitors may rotate out with other people during the day. One designated support person (adult) may remain overnight.

## 2021-12-19 ENCOUNTER — Encounter: Payer: Self-pay | Admitting: Urgent Care

## 2021-12-19 ENCOUNTER — Ambulatory Visit
Admission: RE | Admit: 2021-12-19 | Discharge: 2021-12-19 | Disposition: A | Discharge status: Home / Self Care | Payer: BC Managed Care – PPO | Source: Ambulatory Visit | Attending: Surgery | Admitting: Surgery

## 2021-12-19 ENCOUNTER — Encounter
Admission: RE | Admit: 2021-12-19 | Discharge: 2021-12-19 | Disposition: A | Discharge status: Home / Self Care | Payer: BC Managed Care – PPO | Source: Ambulatory Visit | Attending: Surgery | Admitting: Surgery

## 2021-12-19 DIAGNOSIS — R7303 Prediabetes: Secondary | ICD-10-CM | POA: Insufficient documentation

## 2021-12-19 DIAGNOSIS — Z01812 Encounter for preprocedural laboratory examination: Secondary | ICD-10-CM | POA: Diagnosis not present

## 2021-12-19 DIAGNOSIS — R928 Other abnormal and inconclusive findings on diagnostic imaging of breast: Secondary | ICD-10-CM | POA: Diagnosis not present

## 2021-12-19 DIAGNOSIS — N6081 Other benign mammary dysplasias of right breast: Secondary | ICD-10-CM

## 2021-12-19 DIAGNOSIS — R921 Mammographic calcification found on diagnostic imaging of breast: Secondary | ICD-10-CM | POA: Diagnosis not present

## 2021-12-19 DIAGNOSIS — Z01810 Encounter for preprocedural cardiovascular examination: Secondary | ICD-10-CM | POA: Diagnosis not present

## 2021-12-19 HISTORY — PX: BREAST LUMPECTOMY WITH RADIOFREQUENCY TAG IDENTIFICATION: SHX6884

## 2021-12-19 LAB — HEMOGLOBIN A1C
Hgb A1c MFr Bld: 6.3 % — ABNORMAL HIGH (ref 4.8–5.6)
Mean Plasma Glucose: 134.11 mg/dL

## 2021-12-19 LAB — CBC WITH DIFFERENTIAL/PLATELET
Abs Immature Granulocytes: 0.02 10*3/uL (ref 0.00–0.07)
Basophils Absolute: 0 10*3/uL (ref 0.0–0.1)
Basophils Relative: 0 %
Eosinophils Absolute: 0.1 10*3/uL (ref 0.0–0.5)
Eosinophils Relative: 2 %
HCT: 42.4 % (ref 36.0–46.0)
Hemoglobin: 13.4 g/dL (ref 12.0–15.0)
Immature Granulocytes: 0 %
Lymphocytes Relative: 32 %
Lymphs Abs: 2.3 10*3/uL (ref 0.7–4.0)
MCH: 25.4 pg — ABNORMAL LOW (ref 26.0–34.0)
MCHC: 31.6 g/dL (ref 30.0–36.0)
MCV: 80.5 fL (ref 80.0–100.0)
Monocytes Absolute: 0.6 10*3/uL (ref 0.1–1.0)
Monocytes Relative: 9 %
Neutro Abs: 4 10*3/uL (ref 1.7–7.7)
Neutrophils Relative %: 57 %
Platelets: 255 10*3/uL (ref 150–400)
RBC: 5.27 MIL/uL — ABNORMAL HIGH (ref 3.87–5.11)
RDW: 15.6 % — ABNORMAL HIGH (ref 11.5–15.5)
WBC: 7 10*3/uL (ref 4.0–10.5)
nRBC: 0.3 % — ABNORMAL HIGH (ref 0.0–0.2)

## 2021-12-19 LAB — COMPREHENSIVE METABOLIC PANEL
ALT: 71 U/L — ABNORMAL HIGH (ref 0–44)
AST: 41 U/L (ref 15–41)
Albumin: 3.7 g/dL (ref 3.5–5.0)
Alkaline Phosphatase: 131 U/L — ABNORMAL HIGH (ref 38–126)
Anion gap: 9 (ref 5–15)
BUN: 13 mg/dL (ref 6–20)
CO2: 26 mmol/L (ref 22–32)
Calcium: 9.2 mg/dL (ref 8.9–10.3)
Chloride: 102 mmol/L (ref 98–111)
Creatinine, Ser: 0.73 mg/dL (ref 0.44–1.00)
GFR, Estimated: >60 mL/min (ref >60)
Glucose, Bld: 121 mg/dL — ABNORMAL HIGH (ref 70–99)
Potassium: 4.4 mmol/L (ref 3.5–5.1)
Sodium: 137 mmol/L (ref 135–145)
Total Bilirubin: 0.6 mg/dL (ref 0.3–1.2)
Total Protein: 8.1 g/dL (ref 6.5–8.1)

## 2021-12-25 ENCOUNTER — Ambulatory Visit
Admission: RE | Admit: 2021-12-25 | Discharge: 2021-12-25 | Disposition: A | Discharge status: Home / Self Care | Payer: BC Managed Care – PPO | Source: Ambulatory Visit | Attending: Surgery | Admitting: Surgery

## 2021-12-25 ENCOUNTER — Ambulatory Visit
Admission: RE | Admit: 2021-12-25 | Discharge: 2021-12-25 | Disposition: A | Discharge status: Home / Self Care | Payer: BC Managed Care – PPO | Attending: Surgery | Admitting: Surgery

## 2021-12-25 ENCOUNTER — Ambulatory Visit: Payer: BC Managed Care – PPO | Admitting: Anesthesiology

## 2021-12-25 ENCOUNTER — Other Ambulatory Visit: Payer: Self-pay

## 2021-12-25 ENCOUNTER — Encounter: Payer: Self-pay | Admitting: Surgery

## 2021-12-25 ENCOUNTER — Encounter
Admission: RE | Disposition: A | Discharge status: Home / Self Care | Payer: Self-pay | Source: Home / Self Care | Attending: Surgery

## 2021-12-25 DIAGNOSIS — F419 Anxiety disorder, unspecified: Secondary | ICD-10-CM | POA: Diagnosis not present

## 2021-12-25 DIAGNOSIS — N6310 Unspecified lump in the right breast, unspecified quadrant: Secondary | ICD-10-CM | POA: Diagnosis not present

## 2021-12-25 DIAGNOSIS — Z9011 Acquired absence of right breast and nipple: Secondary | ICD-10-CM | POA: Diagnosis not present

## 2021-12-25 DIAGNOSIS — E119 Type 2 diabetes mellitus without complications: Secondary | ICD-10-CM | POA: Insufficient documentation

## 2021-12-25 DIAGNOSIS — K760 Fatty (change of) liver, not elsewhere classified: Secondary | ICD-10-CM | POA: Insufficient documentation

## 2021-12-25 DIAGNOSIS — Z79899 Other long term (current) drug therapy: Secondary | ICD-10-CM | POA: Insufficient documentation

## 2021-12-25 DIAGNOSIS — D241 Benign neoplasm of right breast: Secondary | ICD-10-CM | POA: Diagnosis not present

## 2021-12-25 DIAGNOSIS — N6081 Other benign mammary dysplasias of right breast: Secondary | ICD-10-CM

## 2021-12-25 DIAGNOSIS — I10 Essential (primary) hypertension: Secondary | ICD-10-CM | POA: Insufficient documentation

## 2021-12-25 DIAGNOSIS — N6011 Diffuse cystic mastopathy of right breast: Secondary | ICD-10-CM | POA: Diagnosis not present

## 2021-12-25 DIAGNOSIS — Z01812 Encounter for preprocedural laboratory examination: Secondary | ICD-10-CM

## 2021-12-25 DIAGNOSIS — N649 Disorder of breast, unspecified: Secondary | ICD-10-CM | POA: Diagnosis not present

## 2021-12-25 DIAGNOSIS — Z6841 Body Mass Index (BMI) 40.0 and over, adult: Secondary | ICD-10-CM | POA: Diagnosis not present

## 2021-12-25 DIAGNOSIS — R928 Other abnormal and inconclusive findings on diagnostic imaging of breast: Secondary | ICD-10-CM | POA: Diagnosis not present

## 2021-12-25 DIAGNOSIS — F32A Depression, unspecified: Secondary | ICD-10-CM | POA: Insufficient documentation

## 2021-12-25 DIAGNOSIS — Z7984 Long term (current) use of oral hypoglycemic drugs: Secondary | ICD-10-CM | POA: Insufficient documentation

## 2021-12-25 DIAGNOSIS — Z1239 Encounter for other screening for malignant neoplasm of breast: Secondary | ICD-10-CM | POA: Diagnosis not present

## 2021-12-25 HISTORY — PX: BREAST LUMPECTOMY WITH RADIO FREQUENCY LOCALIZER: SHX6897

## 2021-12-25 LAB — GLUCOSE, CAPILLARY
Glucose-Capillary: 110 mg/dL — ABNORMAL HIGH (ref 70–99)
Glucose-Capillary: 115 mg/dL — ABNORMAL HIGH (ref 70–99)

## 2021-12-25 SURGERY — BREAST LUMPECTOMY WITH RADIO FREQUENCY LOCALIZER
Anesthesia: General | Laterality: Right

## 2021-12-25 MED ORDER — OXYCODONE HCL 5 MG/5ML PO SOLN: 5.0000 mg | Freq: Once | ORAL | Status: AC | PRN

## 2021-12-25 MED ORDER — MIDAZOLAM HCL 2 MG/2ML IJ SOLN
INTRAMUSCULAR | Status: DC | PRN
  Administered 2021-12-25: 2 mg via INTRAVENOUS

## 2021-12-25 MED ORDER — ORAL CARE MOUTH RINSE: 15.0000 mL | Freq: Once | OROMUCOSAL | Status: AC

## 2021-12-25 MED ORDER — OXYCODONE HCL 5 MG PO TABS: 5.0000 mg | ORAL_TABLET | Freq: Once | ORAL | Status: AC | PRN

## 2021-12-25 MED ORDER — KETOROLAC TROMETHAMINE 30 MG/ML IJ SOLN
INTRAMUSCULAR | Status: AC
  Filled 2021-12-25: qty 1

## 2021-12-25 MED ORDER — MIDAZOLAM HCL 2 MG/2ML IJ SOLN
INTRAMUSCULAR | Status: AC
  Filled 2021-12-25: qty 2

## 2021-12-25 MED ORDER — PHENYLEPHRINE HCL (PRESSORS) 10 MG/ML IV SOLN
INTRAVENOUS | Status: DC | PRN
  Administered 2021-12-25: 80 ug via INTRAVENOUS

## 2021-12-25 MED ORDER — ACETAMINOPHEN 10 MG/ML IV SOLN: 1000.0000 mg | Freq: Once | INTRAVENOUS | Status: DC | PRN

## 2021-12-25 MED ORDER — DROPERIDOL 2.5 MG/ML IJ SOLN
0.6250 mg | Freq: Once | INTRAMUSCULAR | Status: DC | PRN
Start: 2021-12-25 — End: 2021-12-25

## 2021-12-25 MED ORDER — GABAPENTIN 300 MG PO CAPS: 300.0000 mg | ORAL_CAPSULE | ORAL | Status: AC

## 2021-12-25 MED ORDER — FENTANYL CITRATE (PF) 100 MCG/2ML IJ SOLN: 25.0000 ug | INTRAMUSCULAR | Status: DC | PRN

## 2021-12-25 MED ORDER — GLYCOPYRROLATE 0.2 MG/ML IJ SOLN
INTRAMUSCULAR | Status: DC | PRN
  Administered 2021-12-25: .2 mg via INTRAVENOUS

## 2021-12-25 MED ORDER — LIDOCAINE HCL (CARDIAC) PF 100 MG/5ML IV SOSY
PREFILLED_SYRINGE | INTRAVENOUS | Status: DC | PRN
  Administered 2021-12-25: 100 mg via INTRAVENOUS

## 2021-12-25 MED ORDER — CEFAZOLIN IN SODIUM CHLORIDE 3-0.9 GM/100ML-% IV SOLN
3.0000 g | INTRAVENOUS | Status: AC
  Administered 2021-12-25: 3 g via INTRAVENOUS
  Filled 2021-12-25: qty 100

## 2021-12-25 MED ORDER — CHLORHEXIDINE GLUCONATE CLOTH 2 % EX PADS
6.0000 | MEDICATED_PAD | Freq: Once | CUTANEOUS | Status: AC
  Administered 2021-12-25: 6 via TOPICAL

## 2021-12-25 MED ORDER — ROCURONIUM BROMIDE 100 MG/10ML IV SOLN
INTRAVENOUS | Status: DC | PRN
  Administered 2021-12-25: 50 mg via INTRAVENOUS

## 2021-12-25 MED ORDER — OXYCODONE HCL 5 MG PO TABS
ORAL_TABLET | ORAL | Status: AC
  Administered 2021-12-25: 5 mg via ORAL
  Filled 2021-12-25: qty 1

## 2021-12-25 MED ORDER — ROCURONIUM BROMIDE 10 MG/ML (PF) SYRINGE
PREFILLED_SYRINGE | INTRAVENOUS | Status: AC
Start: 2021-12-25 — End: ?
  Filled 2021-12-25: qty 10

## 2021-12-25 MED ORDER — ACETAMINOPHEN 500 MG PO TABS
ORAL_TABLET | ORAL | Status: AC
  Administered 2021-12-25: 1000 mg via ORAL
  Filled 2021-12-25: qty 2

## 2021-12-25 MED ORDER — FAMOTIDINE 20 MG PO TABS
ORAL_TABLET | ORAL | Status: AC
  Administered 2021-12-25: 20 mg via ORAL
  Filled 2021-12-25: qty 1

## 2021-12-25 MED ORDER — PROMETHAZINE HCL 25 MG/ML IJ SOLN: 6.2500 mg | INTRAMUSCULAR | Status: DC | PRN

## 2021-12-25 MED ORDER — BUPIVACAINE-EPINEPHRINE (PF) 0.25% -1:200000 IJ SOLN
INTRAMUSCULAR | Status: DC | PRN
  Administered 2021-12-25: 30 mL

## 2021-12-25 MED ORDER — FENTANYL CITRATE (PF) 100 MCG/2ML IJ SOLN
INTRAMUSCULAR | Status: AC
  Filled 2021-12-25: qty 2

## 2021-12-25 MED ORDER — SODIUM CHLORIDE 0.9 % IV SOLN: INTRAVENOUS | Status: DC

## 2021-12-25 MED ORDER — CHLORHEXIDINE GLUCONATE 0.12 % MT SOLN: 15.0000 mL | Freq: Once | OROMUCOSAL | Status: AC

## 2021-12-25 MED ORDER — HYDROCODONE-ACETAMINOPHEN 5-325 MG PO TABS: 1.0000 | ORAL_TABLET | Freq: Four times a day (QID) | ORAL | 0 refills | Status: DC | PRN

## 2021-12-25 MED ORDER — DEXAMETHASONE SODIUM PHOSPHATE 10 MG/ML IJ SOLN
INTRAMUSCULAR | Status: DC | PRN
  Administered 2021-12-25: 10 mg via INTRAVENOUS

## 2021-12-25 MED ORDER — ONDANSETRON HCL 4 MG/2ML IJ SOLN
INTRAMUSCULAR | Status: AC
  Filled 2021-12-25: qty 2

## 2021-12-25 MED ORDER — LIDOCAINE HCL (PF) 2 % IJ SOLN
INTRAMUSCULAR | Status: AC
  Filled 2021-12-25: qty 5

## 2021-12-25 MED ORDER — CHLORHEXIDINE GLUCONATE CLOTH 2 % EX PADS: 6.0000 | MEDICATED_PAD | Freq: Once | CUTANEOUS | Status: DC

## 2021-12-25 MED ORDER — SUGAMMADEX SODIUM 500 MG/5ML IV SOLN
INTRAVENOUS | Status: AC
  Filled 2021-12-25: qty 5

## 2021-12-25 MED ORDER — PHENYLEPHRINE 80 MCG/ML (10ML) SYRINGE FOR IV PUSH (FOR BLOOD PRESSURE SUPPORT)
PREFILLED_SYRINGE | INTRAVENOUS | Status: AC
  Filled 2021-12-25: qty 10

## 2021-12-25 MED ORDER — GABAPENTIN 300 MG PO CAPS
ORAL_CAPSULE | ORAL | Status: AC
  Administered 2021-12-25: 300 mg via ORAL
  Filled 2021-12-25: qty 1

## 2021-12-25 MED ORDER — BUPIVACAINE LIPOSOME 1.3 % IJ SUSP: 20.0000 mL | Freq: Once | INTRAMUSCULAR | Status: DC

## 2021-12-25 MED ORDER — PROPOFOL 10 MG/ML IV BOLUS
INTRAVENOUS | Status: AC
  Filled 2021-12-25: qty 20

## 2021-12-25 MED ORDER — ONDANSETRON HCL 4 MG/2ML IJ SOLN
INTRAMUSCULAR | Status: DC | PRN
  Administered 2021-12-25: 4 mg via INTRAVENOUS

## 2021-12-25 MED ORDER — BUPIVACAINE-EPINEPHRINE (PF) 0.25% -1:200000 IJ SOLN
INTRAMUSCULAR | Status: AC
  Filled 2021-12-25: qty 30

## 2021-12-25 MED ORDER — DEXAMETHASONE SODIUM PHOSPHATE 10 MG/ML IJ SOLN
INTRAMUSCULAR | Status: AC
  Filled 2021-12-25: qty 1

## 2021-12-25 MED ORDER — SUGAMMADEX SODIUM 500 MG/5ML IV SOLN
INTRAVENOUS | Status: DC | PRN
  Administered 2021-12-25: 250 mg via INTRAVENOUS

## 2021-12-25 MED ORDER — ACETAMINOPHEN 500 MG PO TABS: 1000.0000 mg | ORAL_TABLET | ORAL | Status: AC

## 2021-12-25 MED ORDER — CHLORHEXIDINE GLUCONATE 0.12 % MT SOLN
OROMUCOSAL | Status: AC
  Administered 2021-12-25: 15 mL via OROMUCOSAL
  Filled 2021-12-25: qty 15

## 2021-12-25 MED ORDER — PROPOFOL 10 MG/ML IV BOLUS
INTRAVENOUS | Status: DC | PRN
  Administered 2021-12-25: 160 mg via INTRAVENOUS

## 2021-12-25 MED ORDER — FAMOTIDINE 20 MG PO TABS: 20.0000 mg | ORAL_TABLET | Freq: Once | ORAL | Status: AC

## 2021-12-25 MED ORDER — FENTANYL CITRATE (PF) 100 MCG/2ML IJ SOLN
INTRAMUSCULAR | Status: DC | PRN
  Administered 2021-12-25 (×2): 50 ug via INTRAVENOUS

## 2021-12-25 SURGICAL SUPPLY — 41 items
ADH SKN CLS APL DERMABOND .7 (GAUZE/BANDAGES/DRESSINGS) ×1
APL PRP STRL LF DISP 70% ISPRP (MISCELLANEOUS) ×1
APPLIER CLIP 9.375 SM OPEN (CLIP)
APR CLP SM 9.3 20 MLT OPN (CLIP)
BLADE SURG 15 STRL LF DISP TIS (BLADE) ×1 IMPLANT
BLADE SURG 15 STRL SS (BLADE) ×2
CHLORAPREP W/TINT 26 (MISCELLANEOUS) ×2 IMPLANT
CLIP APPLIE 9.375 SM OPEN (CLIP) IMPLANT
CNTNR SPEC 2.5X3XGRAD LEK (MISCELLANEOUS)
CONT SPEC 4OZ STER OR WHT (MISCELLANEOUS)
CONT SPEC 4OZ STRL OR WHT (MISCELLANEOUS)
CONTAINER SPEC 2.5X3XGRAD LEK (MISCELLANEOUS) IMPLANT
DERMABOND ADVANCED (GAUZE/BANDAGES/DRESSINGS) ×1
DERMABOND ADVANCED .7 DNX12 (GAUZE/BANDAGES/DRESSINGS) ×1 IMPLANT
DEVICE DUBIN SPECIMEN MAMMOGRA (MISCELLANEOUS) ×2 IMPLANT
DRAPE LAPAROTOMY TRNSV 106X77 (MISCELLANEOUS) ×2 IMPLANT
ELECT CAUTERY BLADE TIP 2.5 (TIP) ×2
ELECT REM PT RETURN 9FT ADLT (ELECTROSURGICAL) ×2
ELECTRODE CAUTERY BLDE TIP 2.5 (TIP) ×1 IMPLANT
ELECTRODE REM PT RTRN 9FT ADLT (ELECTROSURGICAL) ×1 IMPLANT
GAUZE 4X4 16PLY ~~LOC~~+RFID DBL (SPONGE) ×1 IMPLANT
GLOVE ORTHO TXT STRL SZ7.5 (GLOVE) ×4 IMPLANT
GOWN STRL REUS W/ TWL LRG LVL3 (GOWN DISPOSABLE) ×1 IMPLANT
GOWN STRL REUS W/ TWL XL LVL3 (GOWN DISPOSABLE) ×1 IMPLANT
GOWN STRL REUS W/TWL LRG LVL3 (GOWN DISPOSABLE) ×2
GOWN STRL REUS W/TWL XL LVL3 (GOWN DISPOSABLE) ×2
KIT MARKER MARGIN INK (KITS) IMPLANT
KIT TURNOVER KIT A (KITS) ×2 IMPLANT
MANIFOLD NEPTUNE II (INSTRUMENTS) ×2 IMPLANT
NEEDLE HYPO 22GX1.5 SAFETY (NEEDLE) ×2 IMPLANT
PACK BASIN MINOR ARMC (MISCELLANEOUS) ×2 IMPLANT
SET LOCALIZER 20 PROBE US (MISCELLANEOUS) ×2 IMPLANT
SUT MNCRL 4-0 (SUTURE) ×2
SUT MNCRL 4-0 27XMFL (SUTURE) ×1
SUT VIC AB 3-0 SH 27 (SUTURE) ×2
SUT VIC AB 3-0 SH 27X BRD (SUTURE) ×1 IMPLANT
SUTURE MNCRL 4-0 27XMF (SUTURE) ×1 IMPLANT
SYR 20ML LL LF (SYRINGE) ×2 IMPLANT
TRAP NEPTUNE SPECIMEN COLLECT (MISCELLANEOUS) ×2 IMPLANT
WATER STERILE IRR 1000ML POUR (IV SOLUTION) ×2 IMPLANT
WATER STERILE IRR 500ML POUR (IV SOLUTION) ×1 IMPLANT

## 2021-12-25 NOTE — Interval H&P Note (Signed)
History and Physical Interval Note:  12/25/2021 7:13 AM  Alisha Kim  has presented today for surgery, with the diagnosis of right breast mass.  The various methods of treatment have been discussed with the patient and family. After consideration of risks, benefits and other options for treatment, the patient has consented to  Procedure(s): BREAST LUMPECTOMY WITH RADIO FREQUENCY LOCALIZER (Right) as a surgical intervention.  The patient's history has been reviewed, patient examined, no change in status, stable for surgery.  I have reviewed the patient's chart and labs.  Questions were answered to the patient's satisfaction.    The right breast is marked.   Ronny Bacon

## 2021-12-25 NOTE — Anesthesia Procedure Notes (Signed)
Procedure Name: Intubation Date/Time: 12/25/2021 7:42 AM  Performed by: Jonna Clark, CRNAPre-anesthesia Checklist: Patient identified, Patient being monitored, Timeout performed, Emergency Drugs available and Suction available Patient Re-evaluated:Patient Re-evaluated prior to induction Oxygen Delivery Method: Circle system utilized Preoxygenation: Pre-oxygenation with 100% oxygen Induction Type: IV induction Ventilation: Mask ventilation without difficulty Laryngoscope Size: 2 and Miller Grade View: Grade I Tube type: Oral Tube size: 7.0 mm Number of attempts: 1 Placement Confirmation: ETT inserted through vocal cords under direct vision, positive ETCO2 and breath sounds checked- equal and bilateral Secured at: 21 cm Tube secured with: Tape Dental Injury: Teeth and Oropharynx as per pre-operative assessment

## 2021-12-25 NOTE — Discharge Instructions (Signed)
AMBULATORY SURGERY  ?DISCHARGE INSTRUCTIONS ? ? ?The drugs that you were given will stay in your system until tomorrow so for the next 24 hours you should not: ? ?Drive an automobile ?Make any legal decisions ?Drink any alcoholic beverage ? ? ?You may resume regular meals tomorrow.  Today it is better to start with liquids and gradually work up to solid foods. ? ?You may eat anything you prefer, but it is better to start with liquids, then soup and crackers, and gradually work up to solid foods. ? ? ?Please notify your doctor immediately if you have any unusual bleeding, trouble breathing, redness and pain at the surgery site, drainage, fever, or pain not relieved by medication. ? ? ? ?Additional Instructions: ? ? ? ?Please contact your physician with any problems or Same Day Surgery at [phone], Monday through Friday 6 am to 4 pm, or [place] [facility] [place] [facility] number at [phone].  ?

## 2021-12-25 NOTE — Transfer of Care (Signed)
Immediate Anesthesia Transfer of Care Note  Patient: Alisha Kim  Procedure(s) Performed: BREAST LUMPECTOMY WITH RADIO FREQUENCY LOCALIZER (Right)  Patient Location: PACU  Anesthesia Type:General  Level of Consciousness: drowsy  Airway & Oxygen Therapy: Patient Spontanous Breathing and Patient connected to face mask oxygen  Post-op Assessment: Report given to RN and Post -op Vital signs reviewed and stable  Post vital signs: Reviewed and stable  Last Vitals:  Vitals Value Taken Time  BP 122/74 12/25/21 0854  Temp    Pulse 65 12/25/21 0855  Resp 14 12/25/21 0856  SpO2 100 % 12/25/21 0855  Vitals shown include unvalidated device data.  Last Pain:  Vitals:   12/25/21 0624  TempSrc: Temporal  PainSc: 0-No pain         Complications: No notable events documented.

## 2021-12-25 NOTE — Op Note (Signed)
  Pre-operative Diagnosis: Flat epithelial atypia, right upper inner quadrant breast.     Post-operative Diagnosis: Same  Surgeon: Ronny Bacon, M.D., [facility]  Anesthesia: General   Procedure: Excisional biopsy right upper inner quadrant breast, RFID tag (X2) directed.   Procedure Details  The patient was seen again in the Holding Room. The benefits, complications, treatment options, and expected outcomes were discussed with the patient. The risks of bleeding, infection, recurrence of symptoms, failure to resolve symptoms, hematoma, seroma, open wound, cosmetic deformity, and the need for further surgery were discussed.  The patient was taken to Operating Room, identified as Alisha Kim and the procedure verified.  A Time Out was held and the above information confirmed.  Prior to the induction of general anesthesia, antibiotic prophylaxis was administered. VTE prophylaxis was in place. The patient was positioned in the supine position. Appropriate anesthesia was then administered and tolerated well. The LOCALizer is used to mark the skin for incision.  The chest was prepped with Chloraprep and draped in the sterile fashion.   Attention was turned to the RFID tag localization site where an incision was made along the skin lines, through the most cephalad biopsy scar. Dissection using the LOCALizer to perform a lumpectomy with adequate margins was performed. This was done with electrocautery and sharp dissection with Mayo scissors. There was minimal bleeding, and the cavity packed.  The specimen was taken to the back table and painted to demarcate the 6 surfaces of potential margin.   I returned to the cavity to remove the packing, and hemostasis was confirmed with electrocautery.   Once assuring that hemostasis was adequate and checked multiple times the wound was closed with interrupted 3-0 Vicryl followed by 4-0 subcuticular Monocryl sutures. Dermabond is utilized to seal the incision.   Additional local anesthetic was then placed within the biopsy cavity.   Findings: Faxitron imaging: Confirmed presence of both RFID and radiologic markers.  Estimated Blood Loss: Minimal         Drains: None         Specimens: Upper inner quadrant right breast tissue       Complications: None.         Condition: Stable   Ronny Bacon, M.D., [facility]  12/25/2021 ; 8:50 AM

## 2021-12-25 NOTE — Anesthesia Postprocedure Evaluation (Signed)
Anesthesia Post Note  Patient: Alisha Kim  Procedure(s) Performed: BREAST LUMPECTOMY WITH RADIO FREQUENCY LOCALIZER (Right)  Patient location during evaluation: PACU Anesthesia Type: General Level of consciousness: awake and alert Pain management: pain level controlled Vital Signs Assessment: post-procedure vital signs reviewed and stable Respiratory status: spontaneous breathing, nonlabored ventilation and respiratory function stable Cardiovascular status: blood pressure returned to baseline and stable Postop Assessment: no apparent nausea or vomiting Anesthetic complications: no   No notable events documented.   Last Vitals:  Vitals:   12/25/21 0935 12/25/21 0942  BP:  120/72  Pulse: 64 (!) 57  Resp: 15 16  Temp:  (!) 36.1 C  SpO2: 99% 99%    Last Pain:  Vitals:   12/25/21 0942  TempSrc: Temporal  PainSc: 2                  [place] Ouch

## 2021-12-25 NOTE — Anesthesia Preprocedure Evaluation (Addendum)
Anesthesia Evaluation  Patient identified by MRN, date of birth, ID band Patient awake    Reviewed: Allergy & Precautions, H&P , NPO status , Patient's Chart, lab work & pertinent test results  History of Anesthesia Complications Negative for: history of anesthetic complications  Airway Mallampati: III  TM Distance: >3 FB Neck ROM: full    Dental no notable dental hx.    Pulmonary asthma ,    Pulmonary exam normal        Cardiovascular Exercise Tolerance: Good hypertension, Pt. on medications (-) angina(-) Past MI and (-) DOE  Rhythm:Regular Rate:Normal     Neuro/Psych PSYCHIATRIC DISORDERS Anxiety Depression negative neurological ROS     GI/Hepatic negative GI ROS, Fatty Liver Dz   Endo/Other  diabetes, Type 2Morbid obesity  Renal/GU negative Renal ROS  negative genitourinary   Musculoskeletal   Abdominal (+) + obese,   Peds  Hematology negative hematology ROS (+)   Anesthesia Other Findings right breast mass  Past Medical History: 09/2015: Adenomyosis No date: Allergy No date: Anemia     Comment:  history of No date: Anxiety     Comment:  controlled;  No date: Asthma No date: Depression with anxiety No date: Diabetes mellitus without complication [facility])     Comment:  was told she was pre-diabetic 08/14/2016: Fibroids     Comment:  [place] March 2018 08/08/2016: History of Helicobacter pylori infection     Comment:  2004 08/14/2016: Hydrosalpinx     Comment:  right No date: Hypertension     Comment:  controlled with medication;  No date: Pre-diabetes  Past Surgical History: No date: ABDOMINAL HYSTERECTOMY No date: DENTAL SURGERY No date: NO PAST SURGERIES No date: TONSILLECTOMY 09/29/2016: VAGINAL HYSTERECTOMY; Bilateral     Comment:  Procedure: HYSTERECTOMY VAGINAL WITH BILATERAL               SALPINGECTOMY;  Surgeon: [name], MD;  Location:               [facility] ORS;  Service: Gynecology;   Laterality: Bilateral;  BMI    Body Mass Index:  38.09 kg/m      Reproductive/Obstetrics negative OB ROS                            Anesthesia Physical  Anesthesia Plan  ASA: 3  Anesthesia Plan: General   Post-op Pain Management: Gabapentin PO (pre-op)*, Tylenol PO (pre-op)* and Toradol IV (intra-op)*   Induction: Intravenous  PONV Risk Score and Plan: 3 and Ondansetron and Dexamethasone  Airway Management Planned: LMA  Additional Equipment:   Intra-op Plan:   Post-operative Plan: Extubation in OR  Informed Consent: I have reviewed the patients History and Physical, chart, labs and discussed the procedure including the risks, benefits and alternatives for the proposed anesthesia with the patient or authorized representative who has indicated his/her understanding and acceptance.     Dental Advisory Given  Plan Discussed with: Anesthesiologist, CRNA and Surgeon  Anesthesia Plan Comments: (Patient consented for risks of anesthesia including but not limited to:  - adverse reactions to medications - risk of intubation if required - damage to teeth, lips or other oral mucosa - sore throat or hoarseness - Damage to heart, brain, lungs or loss of life  Patient voiced understanding.)       Anesthesia Quick Evaluation

## 2021-12-26 LAB — SURGICAL PATHOLOGY

## 2021-12-26 NOTE — Progress Notes (Signed)
Call was excellent

## 2022-01-14 ENCOUNTER — Encounter: Payer: Self-pay | Admitting: Surgery

## 2022-01-14 ENCOUNTER — Ambulatory Visit (INDEPENDENT_AMBULATORY_CARE_PROVIDER_SITE_OTHER): Payer: BC Managed Care – PPO | Admitting: Surgery

## 2022-01-14 VITALS — BP 140/90 | HR 80 | Temp 97.7°F | Ht 66.0 in | Wt 269.0 lb

## 2022-01-14 DIAGNOSIS — Z09 Encounter for follow-up examination after completed treatment for conditions other than malignant neoplasm: Secondary | ICD-10-CM

## 2022-01-14 DIAGNOSIS — N6081 Other benign mammary dysplasias of right breast: Secondary | ICD-10-CM

## 2022-01-14 NOTE — Progress Notes (Signed)
[facility] POST-OP OFFICE VISIT  01/14/2022  HPI: Alisha Kim is a 51 y.o. female 20 days s/p RFID right breast excisional biopsy.  No issues with any wound drainage.  Some mild discomfort and thickening to the upper right breast.  Vital signs: LMP 09/29/2016 Comment: partial   Physical Exam: Constitutional: Appears well. Skin: Upper right breast incision appears to be healing well, with expected amount of inflammatory change/seroma.  No evidence of erythema or induration.  SURGICAL PATHOLOGY  CASE: [phone]  PATIENT: Alisha Kim  Surgical Pathology Report   Specimen Submitted:  A. Breast, right upper medial   Clinical History: Right breast mass   DIAGNOSIS:  A. BREAST, RIGHT, UPPER MEDIAL, EXCISION:  - RESIDUAL FLAT EPITHELIAL ATYPIA AND FOCAL MICROCALCIFICATIONS  ASSOCIATED WITH EDGES OF PREVIOUS BIOPSY SITES.  - 2 BIOPSY CLIPS IDENTIFIED.  - FIBROCYSTIC CHANGES AND FOCAL FIBROADENOMATOID CHANGE.  - SURGICAL MARGINS NEGATIVE FOR ATYPIA AND MALIGNANCY.   Assessment/Plan: Alisha Kim is a 51 y.o. female 20 days s/p RFID right breast excisional biopsy, flat epithelial atypia, completely excised without residual.   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 12/05/2021   Elevated alkaline phosphatase level 05/22/2021   Vitamin D deficiency 05/22/2021   Mixed hyperlipidemia 07/09/2020   Current moderate episode of major depressive disorder ([place]) 02/17/2020   Grief reaction 02/17/2020   Stress reaction 02/17/2020   Morbid obesity ([place]) 05/31/2018   Colon cancer screening    Allergic rhinitis 03/01/2018   Cervical stenosis of spinal canal 08/28/2017   S/P vaginal hysterectomy 20/35/5974   History of Helicobacter pylori infection 08/08/2016   Microcytosis 08/01/2016   Heel spur, left 04/07/2016   Bilateral foot pain 04/04/2016   Anxiety 04/04/2016   Hypertension, goal below 140/90  07/24/2015   Bilateral hip pain 11/17/2014   Atopic dermatitis 11/17/2014   Low serum vitamin D 11/17/2014   Pre-diabetes 11/17/2014   Depression with anxiety 11/17/2014   Mild persistent asthma with allergic rhinitis without complication 16/38/4536    -Anticipate diagnostic mammogram for new baseline of the right breast in 6 months.  Anticipate continued progression of scar/healing over the next few months.  Follow-up in 6 months or as needed. Discussed referral to oncology for antiestrogen prophylaxis.  Ronny Bacon M.D., FACS 01/14/2022, 9:03 AM

## 2022-01-14 NOTE — Patient Instructions (Addendum)
We have sent a referral to Oncology to review any risk reduction methods or medications that are available. They will call you to schedule this appointment.   We will follow up with you in 5-6 months with a mammogram and follow up appointment. We will send you a letter about these appointments.

## 2022-01-24 ENCOUNTER — Inpatient Hospital Stay: Payer: BC Managed Care – PPO | Attending: Internal Medicine | Admitting: Internal Medicine

## 2022-01-24 ENCOUNTER — Encounter: Payer: Self-pay | Admitting: *Deleted

## 2022-01-24 ENCOUNTER — Encounter: Payer: Self-pay | Admitting: Internal Medicine

## 2022-01-24 ENCOUNTER — Inpatient Hospital Stay: Payer: BC Managed Care – PPO

## 2022-01-24 DIAGNOSIS — N6081 Other benign mammary dysplasias of right breast: Secondary | ICD-10-CM

## 2022-01-24 DIAGNOSIS — N6310 Unspecified lump in the right breast, unspecified quadrant: Secondary | ICD-10-CM | POA: Insufficient documentation

## 2022-01-24 NOTE — Progress Notes (Unsigned)
[facility] CONSULT NOTE  Patient Care Team: Delsa Grana, PA-C as PCP - General (Family Medicine)  CHIEF COMPLAINTS/PURPOSE OF CONSULTATION: Flat epithelial atypia/status post lumpectomy of the breast  Oncology History   No history exists.   SURGICAL PATHOLOGY  CASE: [id number]  PATIENT: Alisha Kim  Surgical Pathology Report      Specimen Submitted:  A. Breast, right anterior  B. Breast, right posterior   Clinical History: CALCS seen at [facility]  1, 2 PROB DCIS. A - Coil-shaped  clip placed following stereotactic biopsy of RIGHT breast, upper inner  quadrant, anterior. B - X-shaped clip placed following stereotactic  biopsy of RIGHT breast, upper inner quadrant, posterior.     DIAGNOSIS:  A. 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  - BACKGROUND BENIGN MAMMARY PARENCHYMA WITH MILD STROMAL FIBROSIS,  FIBROCYSTIC CHANGES, AND FOCAL BENIGN ADENOSIS.  - NEGATIVE FOR DUCTAL CARCINOMA IN SITU AND MALIGNANCY.   B. 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  - BACKGROUND BENIGN MAMMARY PARENCHYMA WITH MILD STROMAL FIBROSIS AND  FIBROCYSTIC CHANGES.  - NEGATIVE FOR DEFINITE DUCTAL CARCINOMA IN SITU AND MALIGNANCY.   Comment:  The majority of the atypical changes in B are compatible with standard  flat epithelial atypia (FEA). However, one block (B4) displays an  underlying pagetoid epithelial proliferation that borders on, but is not  diagnostic of ductal carcinoma in situ. The findings are concerning for  potential low grade DCIS associated elsewhere within this FEA. Clinical  and radiographic correlation is essential.  SURGICAL PATHOLOGY  CASE: [phone]  PATIENT: Alisha Kim  Surgical Pathology Report      Specimen Submitted:  A. Breast, right upper medial   Clinical History: Right breast mass       DIAGNOSIS:  A. BREAST, RIGHT, UPPER MEDIAL, EXCISION:  - RESIDUAL FLAT EPITHELIAL ATYPIA AND FOCAL MICROCALCIFICATIONS  ASSOCIATED WITH EDGES OF PREVIOUS BIOPSY SITES.  - 2 BIOPSY CLIPS IDENTIFIED.  - FIBROCYSTIC CHANGES AND FOCAL FIBROADENOMATOID CHANGE.  - SURGICAL MARGINS NEGATIVE FOR ATYPIA AND MALIGNANCY.  HISTORY OF PRESENTING ILLNESS: Ambulating independently.  Accompanied by husband.  Alisha Kim 51 y.o.  female with no prior history of malignancy has been referred to [place] for flat epithelial atypia.  Patient had a screening mammogram that was abnormal calcifications which led to further work-up with a diagnostic mammogram/ultrasound.    Patient subsequently underwent biopsy-suggestive of above pathology. Underwent excision/lumpectomy-noted to have "residual flat epithelial atypia and focal microcalcifications".  Margins were negative.  Patient has been referred to [place] for further evaluation recommendations.  Patient is healing well from surgery.  Denies any wound healing complications.  Review of Systems  Constitutional:  Negative for chills, diaphoresis, fever, malaise/fatigue and weight loss.  HENT:  Negative for nosebleeds and sore throat.   Eyes:  Negative for double vision.  Respiratory:  Negative for cough, hemoptysis, sputum production, shortness of breath and wheezing.   Cardiovascular:  Negative for chest pain, palpitations, orthopnea and leg swelling.  Gastrointestinal:  Negative for abdominal pain, blood in stool, constipation, diarrhea, heartburn, melena, nausea and vomiting.  Genitourinary:  Negative for dysuria, frequency and urgency.  Musculoskeletal:  Negative for back pain and joint pain.  Skin: Negative.  Negative for itching and rash.  Neurological:  Negative for  dizziness, tingling, focal weakness, weakness and  headaches.  Endo/Heme/Allergies:  Does not bruise/bleed easily.  Psychiatric/Behavioral:  Negative for depression. The patient is not nervous/anxious and does not have insomnia.     MEDICAL HISTORY:  Past Medical History:  Diagnosis Date   Adenomyosis 09/2015   Allergy    Anemia    history of   Anxiety    controlled;    Asthma    Depression with anxiety    Diabetes mellitus without complication [facility])    was told she was pre-diabetic   Fibroids 08/14/2016   [place] March 4196   History of Helicobacter pylori infection 08/08/2016   2004   History of kidney stones    when in her 20's   Hydrosalpinx 08/14/2016   right   Hypertension    controlled with medication;    Mixed hyperlipidemia 07/09/2020   Pre-diabetes     SURGICAL HISTORY: Past Surgical History:  Procedure Laterality Date   ABDOMINAL HYSTERECTOMY     BREAST BIOPSY Right 12/02/2021   Stereo Bx, Coil Clip, path pending   BREAST BIOPSY Right 12/02/2021   Stereo Bx, X clip, path pending   BREAST LUMPECTOMY WITH RADIO FREQUENCY LOCALIZER Right 12/25/2021   Procedure: BREAST LUMPECTOMY WITH RADIO FREQUENCY LOCALIZER;  Surgeon: Ronny Bacon, MD;  Location: [facility] ORS;  Service: General;  Laterality: Right;   BREAST LUMPECTOMY WITH RADIOFREQUENCY TAG IDENTIFICATION Right 12/19/2021   x 2 areas, bracketing   COLONOSCOPY WITH PROPOFOL N/A 05/17/2018   Procedure: COLONOSCOPY WITH PROPOFOL;  Surgeon: Lin Landsman, MD;  Location: [facility];  Service: Gastroenterology;  Laterality: N/A;   DENTAL SURGERY     NO PAST SURGERIES     POLYPECTOMY     TONSILLECTOMY     TUBAL LIGATION     VAGINAL HYSTERECTOMY Bilateral 09/29/2016   Procedure: HYSTERECTOMY VAGINAL WITH BILATERAL SALPINGECTOMY;  Surgeon: [name], MD;  Location: [facility] ORS;  Service: Gynecology;  Laterality: Bilateral;    SOCIAL HISTORY: Social History   Socioeconomic History   Marital status: Married    Spouse name: Jeneen Rinks   Number of children: 1    Years of education: Not on file   Highest education level: Not on file  Occupational History   Not on file  Tobacco Use   Smoking status: Never    Passive exposure: Never   Smokeless tobacco: Never  Vaping Use   Vaping Use: Never used  Substance and Sexual Activity   Alcohol use: Not Currently    Alcohol/week: 0.0 standard drinks of alcohol   Drug use: No   Sexual activity: Yes    Partners: Male    Birth control/protection: None, Surgical  Other Topics Concern   Not on file  Social History Narrative   [place] in [place]; 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 Never smoked; no alcohol. Daughter- 75 [2023]   Social Determinants of Health   Financial Resource Strain: Low Risk  (02/24/2020)   Overall Financial Resource Strain (CARDIA)    Difficulty of Paying Living Expenses: Not hard at all  Food Insecurity: No Food Insecurity (02/24/2020)   Hunger Vital Sign    Worried About Running Out of Food in the Last Year: Never true    Ran Out of Food in the Last Year: Never true  Transportation Needs: No Transportation Needs (02/24/2020)   PRAPARE - Hydrologist (Medical): No    Lack of Transportation (Non-Medical): No  Physical Activity: Not on file  Stress: Stress Concern  Present (02/24/2020)   [place]    Feeling of Stress : To some extent  Social Connections: Moderately Isolated (02/24/2020)   Social Connection and Isolation Panel [NHANES]    Frequency of Communication with Friends and Family: More than three times a week    Frequency of Social Gatherings with Friends and Family: More than three times a week    Attends Religious Services: Never    Marine scientist or Organizations: No    Attends Archivist Meetings: Never    Marital Status: Married  Human resources officer Violence: Not At Risk (02/24/2020)   Humiliation, Afraid, Rape, and Kick questionnaire    Fear of Current  or Ex-Partner: No    Emotionally Abused: No    Physically Abused: No    Sexually Abused: No    FAMILY HISTORY: Family History  Problem Relation Age of Onset   Breast cancer Mother 17   Alcohol abuse Mother    Arthritis Mother    Asthma Mother    Depression Mother    Drug abuse Mother    Hypertension Mother    Mental illness Mother    Cancer Mother        breast and lung   Diabetes Mother    Alcohol abuse Father    Drug abuse Father    Cancer Father        lung   Hypertension Brother    Cancer Maternal Grandmother        cervical   Heart disease Maternal Grandmother    Hypertension Maternal Grandmother    Hearing loss Maternal Aunt    Hypertension Maternal Aunt    Stroke Maternal Aunt    Alzheimer's disease Maternal Aunt    Breast cancer Maternal Aunt        mat great aunt    ALLERGIES:  is allergic to meloxicam, zithromax [azithromycin], pseudoephedrine-guaifenesin, shellfish allergy, and shellfish-derived products.  MEDICATIONS:  Current Outpatient Medications  Medication Sig Dispense Refill   albuterol (PROAIR HFA) 108 (90 Base) MCG/ACT inhaler Inhale 1-2 puffs into the lungs every 4 (four) hours as needed for wheezing or shortness of breath. 1 each 5   amLODipine (NORVASC) 5 MG tablet TAKE 1 TABLET BY MOUTH EVERY DAY IN THE EVENING 90 tablet 1   Cholecalciferol (VITAMIN D3) 20 MCG (800 UNIT) TABS Take 25 mcg by mouth daily. Vit d 25 mcg b12 250 mcg     Collagen-Vitamin C-Biotin (COLLAGEN 1500/C PO) Take by mouth daily. Gummy otc     ferrous sulfate 325 (65 FE) MG EC tablet Take 18 mg by mouth daily. Takes 2 gummies daily     HYDROcodone-acetaminophen (NORCO/VICODIN) 5-325 MG tablet Take 1 tablet by mouth every 6 (six) hours as needed for moderate pain. 15 tablet 0   hydrOXYzine (ATARAX/VISTARIL) 10 MG tablet Take 1-2 tablets (10-20 mg total) by mouth every 6 (six) hours as needed for itching. 120 tablet 2   levocetirizine (XYZAL) 5 MG tablet TAKE 1 TABLET BY MOUTH  EVERY DAY IN THE EVENING 90 tablet 0   metFORMIN (GLUCOPHAGE-XR) 500 MG 24 hr tablet TAKE 1 TABLET BY MOUTH EVERY DAY WITH BREAKFAST 90 tablet 1   sertraline (ZOLOFT) 25 MG tablet TAKE 1 TABLET (25 MG TOTAL) BY MOUTH DAILY. 90 tablet 2   umeclidinium-vilanterol (ANORO ELLIPTA) 62.5-25 MCG/ACT AEPB Inhale 1 puff into the lungs daily at 6 (six) AM. (Patient taking differently: Inhale 1 puff into the lungs as  needed.) 60 each 5   No current facility-administered medications for this visit.    PHYSICAL EXAMINATION: ECOG PERFORMANCE STATUS: 0 - Asymptomatic  Vitals:   01/24/22 1403  BP: 136/81  Pulse: 80  Temp: 98.1 F (36.7 C)  SpO2: 100%   Filed Weights   01/24/22 1403  Weight: 271 lb 12.8 oz (123.3 kg)    Physical Exam Vitals and nursing note reviewed.  HENT:     Head: Normocephalic and atraumatic.     Mouth/Throat:     Pharynx: Oropharynx is clear.  Eyes:     Extraocular Movements: Extraocular movements intact.     Pupils: Pupils are equal, round, and reactive to light.  Cardiovascular:     Rate and Rhythm: Normal rate and regular rhythm.  Pulmonary:     Comments: Decreased breath sounds bilaterally.  Abdominal:     Palpations: Abdomen is soft.  Musculoskeletal:        General: Normal range of motion.     Cervical back: Normal range of motion.  Skin:    General: Skin is warm.  Neurological:     General: No focal deficit present.     Mental Status: She is alert and oriented to person, place, and time.  Psychiatric:        Behavior: Behavior normal.        Judgment: Judgment normal.     LABORATORY DATA:  I have reviewed the data as listed Lab Results  Component Value Date   WBC 7.0 12/19/2021   HGB 13.4 12/19/2021   HCT 42.4 12/19/2021   MCV 80.5 12/19/2021   PLT 255 12/19/2021   Recent Labs    02/20/21 1541 05/22/21 1453 12/19/21 0858  NA 139 138 137  K 3.9 3.8 4.4  CL 103 103 102  CO2 '31 28 26  '$ GLUCOSE 120* 91 121*  BUN '10 11 13  '$ CREATININE  0.67 0.59 0.73  CALCIUM 9.4 9.2 9.2  GFRNONAA  --   --  >60  PROT  --  7.6 8.1  ALBUMIN  --   --  3.7  AST  --  36* 41  ALT  --  61* 71*  ALKPHOS  --   --  131*  BILITOT  --  0.3 0.6    RADIOGRAPHIC STUDIES: I have personally reviewed the radiological images as listed and agreed with the findings in the report. No results found.   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  I discussed that fortunately the calcifications noted on the breast mammogram/ultrasound-negative for any DCIS or malignancy.  I also reviewed that the risk of breast cancer associated with FEA is equivalent to that of benign proliferative disease without atypia  there are no data to support the use of risk-reducing medications on the basis of current diagnosis.  Hence patient will not require any endocrine therapy for treatment or prophylaxis/risk reduction.  We will review at the tumor conference; and will discuss any further recommendations if different.  Patient had at the tumor conference for 8/28.   # Obesity [Asthma/inhlaers-prediabetic/metformin].  Discussed importance of healthy weight/and weight loss.  Strongly recommend eating more green leafy vegetables and cutting down processed food/ carbohydrates.  Instead increasing whole grains / protein in the diet.  Multiple studies have shown that optimal weight would help improve cardiovascular risk; also shown to cut on the risk of malignancies-colon cancer, breast cancer ovarian/uterine cancer in women and also prostate  cancer in men.  Recommend evaluation with obesity medicine clinic/defer to PCP regarding the referral.  # Genetics: Patient's mother diagnosed with breast cancer.  Unknown genetic risk factors.  Interested patient can be referred to genetic counseling for further evaluation recommendations.    Thank you Dr. Christian Mate for allowing me to participate in the care of your  pleasant patient. Please do not hesitate to contact me with questions or concerns in the interim.  Discussed with Golda Acre  # DISPOSITION: # no labs # follow up as needed-Dr.B  Cc; [place]; [place].    Cammie Sickle, MD 01/26/2022 2:13 PM

## 2022-01-24 NOTE — Progress Notes (Signed)
Patient added to Breast conference on 8/28 per Dr. Jacinto Reap. Request.

## 2022-01-24 NOTE — Assessment & Plan Note (Addendum)
 #   The risk of breast cancer associated with FEA is equivalent to that of benign proliferative disease without atypia  there are no data to support the use of risk-reducing medications on the basis of   # pre-diabetes- on metformoin..   # Obesity [Asthma/inhlaers].  Discussed importance of healthy weight/and weight loss.  Strongly recommend eating more green leafy vegetables and cutting down processed food/ carbohydrates.  Instead increasing whole grains / protein in the diet.  Multiple studies have shown that optimal weight would help improve cardiovascular risk; also shown to cut on the risk of malignancies-colon cancer, breast cancer ovarian/uterine cancer in women and also prostate cancer in men.    # Genetics:   Mom- needs tested; wants to wait  # DISPOSITION: # no labs # follow up as needed-Dr.B  Cc; [place]; [place].

## 2022-02-04 ENCOUNTER — Encounter: Payer: Self-pay | Admitting: Internal Medicine

## 2022-02-04 NOTE — Progress Notes (Signed)
Unable to reach the patient.  Left a voicemail regarding the discussion at the cancer conference.  No further work-up or treatment is recommended.  Patient will need to continue follow-up with mammogram.  Recommended calling if any questions or concerns.

## 2022-04-30 ENCOUNTER — Other Ambulatory Visit: Payer: Self-pay | Admitting: Internal Medicine

## 2022-04-30 DIAGNOSIS — I10 Essential (primary) hypertension: Secondary | ICD-10-CM

## 2022-04-30 NOTE — Telephone Encounter (Signed)
Requested Prescriptions  Pending Prescriptions Disp Refills   amLODipine (NORVASC) 5 MG tablet [Pharmacy Med Name: AMLODIPINE BESYLATE 5 MG TAB] 30 tablet 0    Sig: TAKE 1 TABLET BY MOUTH EVERY DAY IN THE EVENING     Cardiovascular: Calcium Channel Blockers 2 Passed - 04/30/2022  1:52 AM      Passed - Last BP in normal range    BP Readings from Last 1 Encounters:  01/24/22 136/81         Passed - Last Heart Rate in normal range    Pulse Readings from Last 1 Encounters:  01/24/22 80         Passed - Valid encounter within last 6 months    Recent Outpatient Visits           6 months ago Seasonal allergic rhinitis, unspecified trigger   [facility] Teodora Medici, DO   7 months ago Hypertension, goal below 140/90   [place], DO   11 months ago Hypertension, goal below 140/90   [facility] Delsa Grana, PA-C   1 year ago Mild persistent asthma with allergic rhinitis without complication   [place], DO   1 year ago Bilateral carpal tunnel syndrome   [facility] Kathrine Haddock, NP       Future Appointments             In 5 days Delsa Grana, PA-C [facility], [facility]

## 2022-05-05 ENCOUNTER — Encounter: Payer: Self-pay | Admitting: Family Medicine

## 2022-05-05 ENCOUNTER — Other Ambulatory Visit: Payer: Self-pay | Admitting: Family Medicine

## 2022-05-05 ENCOUNTER — Ambulatory Visit (INDEPENDENT_AMBULATORY_CARE_PROVIDER_SITE_OTHER): Payer: BC Managed Care – PPO | Admitting: Family Medicine

## 2022-05-05 ENCOUNTER — Other Ambulatory Visit: Payer: Self-pay | Admitting: Internal Medicine

## 2022-05-05 VITALS — BP 130/82 | HR 99 | Temp 98.0°F | Resp 16 | Ht 66.0 in | Wt 276.7 lb

## 2022-05-05 DIAGNOSIS — M79604 Pain in right leg: Secondary | ICD-10-CM

## 2022-05-05 DIAGNOSIS — J4530 Mild persistent asthma, uncomplicated: Secondary | ICD-10-CM

## 2022-05-05 DIAGNOSIS — E782 Mixed hyperlipidemia: Secondary | ICD-10-CM

## 2022-05-05 DIAGNOSIS — R7989 Other specified abnormal findings of blood chemistry: Secondary | ICD-10-CM | POA: Diagnosis not present

## 2022-05-05 DIAGNOSIS — R7303 Prediabetes: Secondary | ICD-10-CM

## 2022-05-05 DIAGNOSIS — I10 Essential (primary) hypertension: Secondary | ICD-10-CM

## 2022-05-05 DIAGNOSIS — Z5181 Encounter for therapeutic drug level monitoring: Secondary | ICD-10-CM

## 2022-05-05 DIAGNOSIS — Z23 Encounter for immunization: Secondary | ICD-10-CM

## 2022-05-05 DIAGNOSIS — M549 Dorsalgia, unspecified: Secondary | ICD-10-CM

## 2022-05-05 DIAGNOSIS — J302 Other seasonal allergic rhinitis: Secondary | ICD-10-CM

## 2022-05-05 DIAGNOSIS — E119 Type 2 diabetes mellitus without complications: Secondary | ICD-10-CM

## 2022-05-05 DIAGNOSIS — E559 Vitamin D deficiency, unspecified: Secondary | ICD-10-CM

## 2022-05-05 DIAGNOSIS — F419 Anxiety disorder, unspecified: Secondary | ICD-10-CM

## 2022-05-05 DIAGNOSIS — M79605 Pain in left leg: Secondary | ICD-10-CM

## 2022-05-05 DIAGNOSIS — F418 Other specified anxiety disorders: Secondary | ICD-10-CM

## 2022-05-05 DIAGNOSIS — F331 Major depressive disorder, recurrent, moderate: Secondary | ICD-10-CM

## 2022-05-05 NOTE — Progress Notes (Signed)
Name: Alisha Kim   MRN: [id number]    DOB: 12/20/1970   Date:05/05/2022       Progress Note  Chief Complaint  Patient presents with   Follow-up   Hypertension   Hyperlipidemia   Anxiety   Depression     Subjective:   Alisha Kim is a 51 y.o. female, presents to clinic for f/up on chronic conditions  Hypertension:  Currently managed on amlodipine 5 mg Pt reports good med compliance and denies any SE.   Blood pressure today is near well controlled. BP Readings from Last 3 Encounters:  05/05/22 130/82  01/24/22 136/81  01/14/22 (!) 140/90   Pt denies CP, SOB, exertional sx, LE edema, palpitation, Ha's, visual disturbances, lightheadedness, hypotension, syncope.  HLD Not currently on medications - but they are indicated Lab Results  Component Value Date   CHOL 214 (H) 02/20/2021   HDL 42 (L) 02/20/2021   LDLCALC 156 (H) 02/20/2021   TRIG 69 02/20/2021   CHOLHDL 5.1 (H) 02/20/2021  The 10-year ASCVD risk score (Arnett DK, et al., 2019) is: 13.7%   Values used to calculate the score:     Age: 22 years     Sex: Female     Is Non-Hispanic African American: Yes     Diabetic: Yes     Tobacco smoker: No     Systolic Blood Pressure: 270 mmHg     Is BP treated: Yes     HDL Cholesterol: 42 mg/dL     Total Cholesterol: 214 mg/dL  DM: On metformin 500 XR daily Recent pertinent labs: Lab Results  Component Value Date   HGBA1C 6.3 (H) 12/19/2021   HGBA1C 6.4 (H) 05/22/2021   HGBA1C 6.5 (H) 02/20/2021   Asthma/allergies On anoro ellipta, albuterol prn, on xyzal (allegra, claritin ineffective and zyrtec causes sleepiness, tried sample of ryaltris) Reports still well controlled  MDD/anxiety: Previously on lexapro- was cross-tapered to start zoloft, she was on it April to August and she stopped it in August due to weight gain, uses hydroxyzine PRN    05/05/2022    3:20 PM 11/01/2021   12:52 PM 09/20/2021    1:01 PM  Depression screen PHQ 2/9  Decreased  Interest 0 0 0  Down, Depressed, Hopeless 0 0 0  PHQ - 2 Score 0 0 0  Altered sleeping 0 1 0  Tired, decreased energy 0 0 0  Change in appetite 0 0 0  Feeling bad or failure about yourself  0 0 0  Trouble concentrating 0 0 0  Moving slowly or fidgety/restless 0 0 0  Suicidal thoughts 0 0 0  PHQ-9 Score 0 1 0  Difficult doing work/chores Not difficult at all Not difficult at all Not difficult at all      05/05/2022    3:22 PM 11/01/2021   12:53 PM 05/22/2021    2:15 PM 11/08/2020    9:54 AM  GAD 7 : Generalized Anxiety Score  Nervous, Anxious, on Edge 0 0 0 0  Control/stop worrying 0 0 0 0  Worry too much - different things 0 0 0 0  Trouble relaxing 0 0 0 0  Restless 0 0 0 0  Easily annoyed or irritable 0 0 0 0  Afraid - awful might happen 0 0 0 0  Total GAD 7 Score 0 0 0 0  Anxiety Difficulty Not difficult at all Not difficult at all Not difficult at all Not difficult at all   Last labs  had elevated LFT's hx of similar - mild elevation and mild alk phos elevation Hx of low Vit D as well Last vitamin D Lab Results  Component Value Date   VD25OH 24 (L) 05/22/2021   Obesity:  Wt Readings from Last 5 Encounters:  05/05/22 276 lb 11.2 oz (125.5 kg)  01/24/22 271 lb 12.8 oz (123.3 kg)  01/14/22 269 lb (122 kg)  12/25/21 270 lb (122.5 kg)  12/05/21 270 lb (122.5 kg)   BMI Readings from Last 5 Encounters:  05/05/22 44.66 kg/m  01/24/22 43.87 kg/m  01/14/22 43.42 kg/m  12/25/21 43.58 kg/m  12/05/21 43.58 kg/m    Bilateral hand pain and numbness - emergortho - prior management and its getting a little worse - carpal tunnel Also bilateral leg pain right low back/SI joint area starts pain and feels like its pulling  Achy all over with winter month changes    Current Outpatient Medications:    albuterol (PROAIR HFA) 108 (90 Base) MCG/ACT inhaler, Inhale 1-2 puffs into the lungs every 4 (four) hours as needed for wheezing or shortness of breath., Disp: 1 each, Rfl:  5   amLODipine (NORVASC) 5 MG tablet, TAKE 1 TABLET BY MOUTH EVERY DAY IN THE EVENING, Disp: 30 tablet, Rfl: 0   hydrOXYzine (ATARAX/VISTARIL) 10 MG tablet, Take 1-2 tablets (10-20 mg total) by mouth every 6 (six) hours as needed for itching., Disp: 120 tablet, Rfl: 2   metFORMIN (GLUCOPHAGE-XR) 500 MG 24 hr tablet, TAKE 1 TABLET BY MOUTH EVERY DAY WITH BREAKFAST, Disp: 90 tablet, Rfl: 1   umeclidinium-vilanterol (ANORO ELLIPTA) 62.5-25 MCG/ACT AEPB, Inhale 1 puff into the lungs daily at 6 (six) AM. (Patient taking differently: Inhale 1 puff into the lungs as needed.), Disp: 60 each, Rfl: 5   Cholecalciferol (VITAMIN D3) 20 MCG (800 UNIT) TABS, Take 25 mcg by mouth daily. Vit d 25 mcg b12 250 mcg, Disp: , Rfl:    Collagen-Vitamin C-Biotin (COLLAGEN 1500/C PO), Take by mouth daily. Gummy otc, Disp: , Rfl:    ferrous sulfate 325 (65 FE) MG EC tablet, Take 18 mg by mouth daily. 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 12/05/2021   Elevated alkaline phosphatase level 05/22/2021   Vitamin D deficiency 05/22/2021   Mixed hyperlipidemia 07/09/2020   Current moderate episode of major depressive disorder ([place]) 02/17/2020   Grief reaction 02/17/2020   Stress reaction 02/17/2020   Morbid obesity ([place]) 05/31/2018   Colon cancer screening    Allergic rhinitis 03/01/2018   Cervical stenosis of spinal canal 08/28/2017   S/P vaginal hysterectomy 43/15/4008   History of Helicobacter pylori infection 08/08/2016   Microcytosis 08/01/2016   Heel  spur, left 04/07/2016   Bilateral foot pain 04/04/2016   Anxiety 04/04/2016   Hypertension, goal below 140/90 07/24/2015   Bilateral hip pain 11/17/2014   Atopic dermatitis 11/17/2014   Low serum vitamin D 11/17/2014   Pre-diabetes 11/17/2014   Depression with anxiety 11/17/2014   Mild persistent asthma with  allergic rhinitis without complication 14/43/1540    Past Surgical History:  Procedure Laterality Date   ABDOMINAL HYSTERECTOMY     BREAST BIOPSY Right 12/02/2021   Stereo Bx, Coil Clip, path pending   BREAST BIOPSY Right 12/02/2021   Stereo Bx, X clip, path pending   BREAST LUMPECTOMY WITH RADIO FREQUENCY LOCALIZER Right 12/25/2021   Procedure: BREAST LUMPECTOMY WITH RADIO FREQUENCY LOCALIZER;  Surgeon: Ronny Bacon, MD;  Location: [facility] ORS;  Service: General;  Laterality: Right;   BREAST LUMPECTOMY WITH RADIOFREQUENCY TAG IDENTIFICATION Right 12/19/2021   x 2 areas, bracketing   COLONOSCOPY WITH PROPOFOL N/A 05/17/2018   Procedure: COLONOSCOPY WITH PROPOFOL;  Surgeon: Lin Landsman, MD;  Location: [facility] ENDOSCOPY;  Service: Gastroenterology;  Laterality: N/A;   DENTAL SURGERY     NO PAST SURGERIES     POLYPECTOMY     TONSILLECTOMY     TUBAL LIGATION     VAGINAL HYSTERECTOMY Bilateral 09/29/2016   Procedure: HYSTERECTOMY VAGINAL WITH BILATERAL SALPINGECTOMY;  Surgeon: [name], MD;  Location: [facility] ORS;  Service: Gynecology;  Laterality: Bilateral;    Family History  Problem Relation Age of Onset   Breast cancer Mother 74   Alcohol abuse Mother    Arthritis Mother    Asthma Mother    Depression Mother    Drug abuse Mother    Hypertension Mother    Mental illness Mother    Cancer Mother        breast and lung   Diabetes Mother    Alcohol abuse Father    Drug abuse Father    Cancer Father        lung   Hypertension Brother    Cancer Maternal Grandmother        cervical   Heart disease Maternal Grandmother    Hypertension Maternal Grandmother     Hearing loss Maternal Aunt    Hypertension Maternal Aunt    Stroke Maternal Aunt    Alzheimer's disease Maternal Aunt    Breast cancer Maternal Aunt        mat great aunt    Social History   Tobacco Use   Smoking status: Never    Passive exposure: Never   Smokeless tobacco: Never  Vaping Use   Vaping Use: Never used  Substance Use Topics   Alcohol use: Not Currently    Alcohol/week: 0.0 standard drinks of alcohol   Drug use: No     Allergies  Allergen Reactions   Meloxicam Other (See Comments)    Felt horrible, headache, bloating, back pain, SHOB; no rash   Zithromax [Azithromycin] Swelling    Tongue    Pseudoephedrine-Guaifenesin Swelling    Of tongue   Shellfish Allergy Rash   Shellfish-Derived Products Rash    Health Maintenance  Topic Date Due   Diabetic kidney evaluation - Urine ACR  Never done   PAP SMEAR-Modifier  11/12/2019   COVID-19 Vaccine (3 - 2023-24 season) 05/21/2022 (Originally 02/07/2022)   Zoster Vaccines- Shingrix (1 of 2) 08/05/2022 (Originally 04/25/2021)   INFLUENZA VACCINE  09/07/2022 (Originally 01/07/2022)   FOOT EXAM  05/22/2022   HEMOGLOBIN A1C  06/21/2022   OPHTHALMOLOGY EXAM  10/16/2022   MAMMOGRAM  11/02/2022   Diabetic kidney evaluation - GFR measurement  12/20/2022   COLONOSCOPY (Pts 45-18yr Insurance coverage will need to be confirmed)  05/17/2028   Hepatitis C Screening  Completed   HIV Screening  Completed   HPV VACCINES  Aged Out    Chart Review Today:  I personally reviewed active problem list, medication list, allergies, family history, social history, health maintenance, notes from last encounter, lab results, imaging with the patient/caregiver today.    Review of Systems  Constitutional: Negative.   HENT: Negative.    Eyes: Negative.   Respiratory: Negative.    Cardiovascular: Negative.   Gastrointestinal: Negative.   Endocrine: Negative.   Genitourinary: Negative.   Musculoskeletal: Negative.   Skin: Negative.    Allergic/Immunologic: Negative.   Neurological: Negative.   Hematological: Negative.   Psychiatric/Behavioral: Negative.    All other systems reviewed and are negative.    Objective:   Vitals:   05/05/22 1527  BP: 130/82  Pulse: 99  Resp: 16  Temp: 98 F (36.7 C)  TempSrc: Oral  SpO2: 100%  Weight: 276 lb 11.2 oz (125.5 kg)  Height: _0  (1.676 m)    Body mass index is 44.66 kg/m.  Physical Exam Vitals and nursing note reviewed.  Constitutional:      General: She is not in acute distress.    Appearance: She is well-developed. She is obese. She is not ill-appearing, toxic-appearing or diaphoretic.  HENT:     Head: Normocephalic and atraumatic.     Right Ear: External ear normal.     Left Ear: External ear normal.     Nose: Nose normal.     Mouth/Throat:     Mouth: Mucous membranes are moist.     Pharynx: Oropharynx is clear.  Eyes:     General:        Right eye: No discharge.        Left eye: No discharge.     Conjunctiva/sclera: Conjunctivae normal.  Neck:     Trachea: No tracheal deviation.  Cardiovascular:     Rate and Rhythm: Normal rate and regular rhythm.     Pulses: Normal pulses.     Heart sounds: Normal heart sounds. No murmur heard.    No friction rub. No gallop.  Pulmonary:     Effort: Pulmonary effort is normal. No respiratory distress.     Breath sounds: Normal breath sounds. No stridor. No wheezing, rhonchi or rales.  Abdominal:     General: Bowel sounds are normal.     Palpations: Abdomen is soft.  Musculoskeletal:        General: No swelling or tenderness.  Skin:    General: Skin is warm and dry.     Coloration: Skin is not jaundiced or pale.     Findings: No rash.  Neurological:     Mental Status: She is alert. Mental status is at baseline.     Motor: No abnormal muscle tone.     Coordination: Coordination normal.  Psychiatric:        Mood and Affect: Mood normal.        Behavior: Behavior normal.         Assessment & Plan:    Problem List Items Addressed This Visit       Cardiovascular and Mediastinum   Hypertension, goal below 140/90 - Primary (Chronic)    Bp near well controlled - would like <130/80 Discussed different meds to use - with T2DM she should be on ACEI/ARB Currently on Norvasc 5 mg -discussed with patient and she would not like to increase this to 10 she has concern with lower extremity edema She was previously on lisinopril HCTZ -she does not know why the medications were changed many years ago Consider changing to ACEI/ARB  - she probably doesn't  need multiple meds         Respiratory   Mild persistent asthma with allergic rhinitis without complication (Chronic)    Sx continue to be very well controlled No recent exacerbations More seasonal- only few times of year she needs Breo Tried xyzal allergy meds which she will also use when needed seasonally        Endocrine   Type 2 diabetes mellitus without complication, without long-term current use of insulin (HCC)   Relevant Orders   COMPLETE METABOLIC PANEL WITH GFR (Completed)   Hemoglobin A1c (Completed)   Microalbumin / creatinine urine ratio (Completed)     Other   Anxiety    See MDD for A&P      Current moderate episode of major depressive disorder ([place])    Currently doing well w/o meds She did feel zoloft was effective earlier this year but it caused significant weight gain with a very low dose so she stopped it PHQ-9 and GAD-7 were reviewed and currently both negative      Mixed hyperlipidemia    Not currently on meds - with likely T2DM she need statin Lipids high previously and pt has gained weight with associated elevated LFTs Recheck labs, A1C/lipids      Relevant Orders   COMPLETE METABOLIC PANEL WITH GFR (Completed)   Lipid panel (Completed)   Vitamin D deficiency    Recheck with multiple sx and prior abnormal labs      Relevant Orders   VITAMIN D 25 Hydroxy (Vit-D Deficiency, Fractures)   Other Visit  Diagnoses     Need for influenza vaccination       refused   Elevated LFTs       recheck labs, monitoring - likely associated with obesity, T2DM, HLD - encouraged diet/lifestyle efforts, avoid processed foods, work on sugar and lipid mgmt   Relevant Orders   COMPLETE METABOLIC PANEL WITH GFR (Completed)   Encounter for medication monitoring       Relevant Orders   COMPLETE METABOLIC PANEL WITH GFR (Completed)   Hemoglobin A1c (Completed)   Microalbumin / creatinine urine ratio (Completed)   Lipid panel (Completed)   Pain in both lower extremities       not really in muscles, bones or joints - just achy and all over, even some hyperalgesia in skin - FMS?  check basic labs - will need additional appt to address   Back pain, unspecified back location, unspecified back pain laterality, unspecified chronicity       low back, SI joint area some to legs, hx of multiple joints that hurt and cervical stenosis - unclear if arthritic/autoimmune or ever worked up, not radicular       F/up 3-6 months pending lab results  Delsa Grana, PA-C 05/05/22 3:29 PM

## 2022-05-06 LAB — MICROALBUMIN / CREATININE URINE RATIO
Creatinine, Urine: 150 mg/dL (ref 20–275)
Microalb Creat Ratio: 3 mcg/mg creat (ref <30)
Microalb, Ur: 0.4 mg/dL

## 2022-05-06 LAB — COMPLETE METABOLIC PANEL WITH GFR
AG Ratio: 1.1 (calc) (ref 1.0–2.5)
ALT: 62 U/L — ABNORMAL HIGH (ref 6–29)
AST: 36 U/L — ABNORMAL HIGH (ref 10–35)
Albumin: 4 g/dL (ref 3.6–5.1)
Alkaline phosphatase (APISO): 128 U/L (ref 37–153)
BUN: 11 mg/dL (ref 7–25)
CO2: 28 mmol/L (ref 20–32)
Calcium: 9.1 mg/dL (ref 8.6–10.4)
Chloride: 105 mmol/L (ref 98–110)
Creat: 0.61 mg/dL (ref 0.50–1.03)
Globulin: 3.5 g/dL (calc) (ref 1.9–3.7)
Glucose, Bld: 94 mg/dL (ref 65–99)
Potassium: 3.5 mmol/L (ref 3.5–5.3)
Sodium: 139 mmol/L (ref 135–146)
Total Bilirubin: 0.4 mg/dL (ref 0.2–1.2)
Total Protein: 7.5 g/dL (ref 6.1–8.1)
eGFR: 108 mL/min/{1.73_m2} (ref >=60)

## 2022-05-06 LAB — HEMOGLOBIN A1C
Hgb A1c MFr Bld: 6.8 % of total Hgb — ABNORMAL HIGH (ref <5.7)
Mean Plasma Glucose: 148 mg/dL
eAG (mmol/L): 8.2 mmol/L

## 2022-05-06 LAB — VITAMIN D 25 HYDROXY (VIT D DEFICIENCY, FRACTURES): Vit D, 25-Hydroxy: 25 ng/mL — ABNORMAL LOW (ref 30–100)

## 2022-05-06 LAB — LIPID PANEL
Cholesterol: 223 mg/dL — ABNORMAL HIGH (ref <200)
HDL: 46 mg/dL — ABNORMAL LOW (ref >=50)
LDL Cholesterol (Calc): 160 mg/dL (calc) — ABNORMAL HIGH
Non-HDL Cholesterol (Calc): 177 mg/dL (calc) — ABNORMAL HIGH (ref <130)
Total CHOL/HDL Ratio: 4.8 (calc) (ref <5.0)
Triglycerides: 71 mg/dL (ref <150)

## 2022-05-06 NOTE — Telephone Encounter (Signed)
Requested Prescriptions  Pending Prescriptions Disp Refills   amLODipine (NORVASC) 5 MG tablet [Pharmacy Med Name: AMLODIPINE BESYLATE 5 MG TAB] 90 tablet 1    Sig: TAKE 1 TABLET BY MOUTH EVERY DAY IN THE EVENING     Cardiovascular: Calcium Channel Blockers 2 Passed - 05/05/2022  3:27 PM      Passed - Last BP in normal range    BP Readings from Last 1 Encounters:  05/05/22 130/82         Passed - Last Heart Rate in normal range    Pulse Readings from Last 1 Encounters:  05/05/22 99         Passed - Valid encounter within last 6 months    Recent Outpatient Visits           Yesterday Hypertension, goal below 140/90   [facility] Delsa Grana, PA-C   6 months ago Seasonal allergic rhinitis, unspecified trigger   [facility] Teodora Medici, DO   7 months ago Hypertension, goal below 140/90   [place], DO   11 months ago Hypertension, goal below 140/90   [facility] Delsa Grana, PA-C   1 year ago Mild persistent asthma with allergic rhinitis without complication   [facility] Myles Gip, DO       Future Appointments             In 6 months Delsa Grana, PA-C [facility]

## 2022-05-08 ENCOUNTER — Other Ambulatory Visit: Payer: Self-pay | Admitting: Family Medicine

## 2022-05-08 ENCOUNTER — Encounter: Payer: Self-pay | Admitting: Family Medicine

## 2022-05-08 DIAGNOSIS — E782 Mixed hyperlipidemia: Secondary | ICD-10-CM

## 2022-05-08 DIAGNOSIS — E119 Type 2 diabetes mellitus without complications: Secondary | ICD-10-CM

## 2022-05-08 MED ORDER — ROSUVASTATIN CALCIUM 5 MG PO TABS: 5.0000 mg | ORAL_TABLET | Freq: Every day | ORAL | 3 refills | Status: DC

## 2022-05-08 MED ORDER — METFORMIN HCL ER 750 MG PO TB24: ORAL_TABLET | ORAL | 1 refills | Status: DC

## 2022-05-12 DIAGNOSIS — E119 Type 2 diabetes mellitus without complications: Secondary | ICD-10-CM | POA: Insufficient documentation

## 2022-05-12 NOTE — Assessment & Plan Note (Signed)
See MDD for A&P

## 2022-05-12 NOTE — Assessment & Plan Note (Signed)
Bp near well controlled - would like <130/80 Discussed different meds to use - with T2DM she should be on ACEI/ARB Currently on Norvasc 5 mg -discussed with patient and she would not like to increase this to 10 she has concern with lower extremity edema She was previously on lisinopril HCTZ -she does not know why the medications were changed many years ago Consider changing to ACEI/ARB  - she probably doesn't need multiple meds

## 2022-05-12 NOTE — Assessment & Plan Note (Signed)
Sx continue to be very well controlled No recent exacerbations More seasonal- only few times of year she needs Breo Tried xyzal allergy meds which she will also use when needed seasonally

## 2022-05-12 NOTE — Assessment & Plan Note (Signed)
Recheck with multiple sx and prior abnormal labs

## 2022-05-12 NOTE — Assessment & Plan Note (Signed)
Not currently on meds - with likely T2DM she need statin Lipids high previously and pt has gained weight with associated elevated LFTs Recheck labs, A1C/lipids

## 2022-05-12 NOTE — Assessment & Plan Note (Signed)
Currently doing well w/o meds She did feel zoloft was effective earlier this year but it caused significant weight gain with a very low dose so she stopped it PHQ-9 and GAD-7 were reviewed and currently both negative

## 2022-05-14 ENCOUNTER — Other Ambulatory Visit: Payer: Self-pay

## 2022-05-14 DIAGNOSIS — N6081 Other benign mammary dysplasias of right breast: Secondary | ICD-10-CM

## 2022-06-04 ENCOUNTER — Encounter: Payer: Self-pay | Admitting: Family Medicine

## 2022-06-04 ENCOUNTER — Ambulatory Visit (INDEPENDENT_AMBULATORY_CARE_PROVIDER_SITE_OTHER): Payer: BC Managed Care – PPO | Admitting: Family Medicine

## 2022-06-04 VITALS — BP 126/82 | HR 92 | Resp 16 | Ht 66.0 in | Wt 271.0 lb

## 2022-06-04 DIAGNOSIS — S61021A Laceration with foreign body of right thumb without damage to nail, initial encounter: Secondary | ICD-10-CM

## 2022-06-04 MED ORDER — TRAMADOL HCL 50 MG PO TABS: 50.0000 mg | ORAL_TABLET | Freq: Two times a day (BID) | ORAL | 0 refills | Status: AC | PRN

## 2022-06-04 NOTE — Progress Notes (Signed)
Patient ID: Alisha Kim, female    DOB: Jun 04, 1971, 51 y.o.   MRN: [id number]  PCP: Delsa Grana, PA-C  Chief Complaint  Patient presents with   Laceration    Cut on thumb. Went to UC but would like PCP to look at it    Subjective:   Alisha Kim is a 51 y.o. female, presents to clinic with CC of the following:  HPI  Presents with laceration/skin flap to right thumb, thin long piece of skin, she was seen yesterday at UC, unable to suture, was difficult to get bleeding to stop Pt notes severe pain last night and this am She has gauze stuck to it - removed carefully in office prior to me entering the room  Per chart - there is an ED visit with AMA noted on it, nurse note that pt was sent to ED from UC to glue laceration and pt noted to have left AMA  ED Triage Note - [name], RN - 06/03/2022 5:34 PM EST Formatting of this note might be different from the original. Patient presents to the ED with right thumb laceration that happened today on a potato peeler, bleeding controlled during triage assessment, UTD on tetanus, sent here from UC to have the laceration glued Electronically signed by [name], RN at 06/03/2022 5:37 PM EST    Encounter Details  Encounter Details Date Type Department Care Team Description  06/04/2022 12:56 AM EST - 06/04/2022 2:48 AM EST Emergency [facility] Emergency Department  [address]  [place],  [address]  [phone]    Discharge Disposition: Left Against Medical Advice or discontinued care     Patient Active Problem List   Diagnosis Date Noted   Type 2 diabetes mellitus without complication, without long-term current use of insulin ([place]) 05/12/2022   Type 2 diabetes mellitus without complication, without long-term current use of insulin ([place]) 05/08/2022   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) 02/17/2020   Grief reaction 02/17/2020   Morbid obesity ([place]) 05/31/2018   Allergic rhinitis 03/01/2018   Cervical stenosis of spinal canal 08/28/2017   S/P vaginal hysterectomy 45/62/5638   History of Helicobacter pylori infection 08/08/2016   Microcytosis 08/01/2016   Heel spur, left 04/07/2016   Bilateral foot pain 04/04/2016   Anxiety 04/04/2016   Hypertension, goal below 140/90 07/24/2015   Bilateral hip pain 11/17/2014   Atopic dermatitis 11/17/2014   Mild persistent asthma with allergic rhinitis without complication 93/73/4287      Current Outpatient Medications:    albuterol (PROAIR HFA) 108 (90 Base) MCG/ACT inhaler, Inhale 1-2 puffs into the lungs every 4 (four) hours as needed for wheezing or shortness of breath., Disp: 1 each, Rfl: 5   amLODipine (NORVASC) 5 MG tablet, TAKE 1 TABLET BY MOUTH EVERY DAY IN THE EVENING, Disp: 90 tablet, Rfl: 1   hydrOXYzine (ATARAX/VISTARIL) 10 MG tablet, Take 1-2 tablets (10-20 mg total) by mouth every 6 (six) hours as needed for itching., Disp: 120 tablet, Rfl: 2   levocetirizine (XYZAL) 5 MG tablet, TAKE 1 TABLET BY MOUTH EVERY DAY IN THE EVENING, Disp: 90 tablet, Rfl: 0   metFORMIN (GLUCOPHAGE-XR) 750 MG 24 hr tablet, Take 1 tablet (750)  by mouth once daily with food/meal, Disp: 90 tablet, Rfl: 1   rosuvastatin (CRESTOR) 5 MG tablet, Take 1 tablet (5 mg total)  by mouth daily., Disp: 90 tablet, Rfl: 3   umeclidinium-vilanterol (ANORO ELLIPTA) 62.5-25 MCG/ACT AEPB, Inhale 1 puff into the lungs daily at 6 (six) AM. (Patient taking differently: Inhale 1 puff into the lungs as needed.), Disp: 60 each, Rfl: 5   Allergies  Allergen Reactions   Meloxicam Other (See Comments)    Felt horrible, headache, bloating, back pain, SHOB; no rash   Zithromax [Azithromycin] Swelling    Tongue    Pseudoephedrine-Guaifenesin Swelling    Of tongue   Shellfish Allergy Rash   Shellfish-Derived Products Rash      Social History   Tobacco Use   Smoking status: Never    Passive exposure: Never   Smokeless tobacco: Never  Vaping Use   Vaping Use: Never used  Substance Use Topics   Alcohol use: Not Currently    Alcohol/week: 0.0 standard drinks of alcohol   Drug use: No      Chart Review Today: I personally reviewed active problem list, medication list, allergies, family history, social history, health maintenance, notes from last encounter, lab results, imaging with the patient/caregiver today.   Review of Systems  Constitutional: Negative.   HENT: Negative.    Eyes: Negative.   Respiratory: Negative.    Cardiovascular: Negative.   Gastrointestinal: Negative.   Endocrine: Negative.   Genitourinary: Negative.   Musculoskeletal: Negative.   Skin: Negative.   Allergic/Immunologic: Negative.   Neurological: Negative.   Hematological: Negative.   Psychiatric/Behavioral: Negative.    All other systems reviewed and are negative.      Objective:   Vitals:   06/04/22 1425  BP: 126/82  Pulse: 92  Resp: 16  SpO2: 98%  Weight: 271 lb (122.9 kg)  Height: _0  (1.676 m)    Body mass index is 43.74 kg/m.  Physical Exam Vitals and nursing note reviewed.  Constitutional:      General: She is not in acute distress.    Appearance: She is obese. She is not ill-appearing, toxic-appearing or diaphoretic.  Musculoskeletal:     Right hand: Laceration and tenderness present. Normal sensation. There is no disruption of two-point discrimination. Normal capillary refill. Normal pulse.     Comments: Right thumb laceration in deep U shape creating skin flap at distal thumb running parallel to lateral nail margin Scant bleeding today on exam after stuck gauze/bandage was removed No surrounding induration, edema, no purulence  Skin:    Capillary Refill: Capillary refill takes less than 2 seconds.  Neurological:     Mental Status: She is alert.      Results for orders placed or  performed in visit on 05/05/22  COMPLETE METABOLIC PANEL WITH GFR  Result Value Ref Range   Glucose, Bld 94 65 - 99 mg/dL   BUN 11 7 - 25 mg/dL   Creat 0.61 0.50 - 1.03 mg/dL   eGFR 108 > OR = 60 mL/min/1.81m   BUN/Creatinine Ratio SEE NOTE: 6 - 22 (calc)   Sodium 139 135 - 146 mmol/L   Potassium 3.5 3.5 - 5.3 mmol/L   Chloride 105 98 - 110 mmol/L   CO2 28 20 - 32 mmol/L   Calcium 9.1 8.6 - 10.4 mg/dL   Total Protein 7.5 6.1 - 8.1 g/dL   Albumin 4.0 3.6 - 5.1 g/dL   Globulin 3.5 1.9 - 3.7 g/dL (calc)   AG Ratio 1.1 1.0 - 2.5 (calc)   Total Bilirubin 0.4 0.2 - 1.2 mg/dL   Alkaline phosphatase (APISO) 128 37 - 153 U/L  AST 36 (H) 10 - 35 U/L   ALT 62 (H) 6 - 29 U/L  Hemoglobin A1c  Result Value Ref Range   Hgb A1c MFr Bld 6.8 (H) <5.7 % of total Hgb   Mean Plasma Glucose 148 mg/dL   eAG (mmol/L) 8.2 mmol/L  Microalbumin / creatinine urine ratio  Result Value Ref Range   Creatinine, Urine 150 20 - 275 mg/dL   Microalb, Ur 0.4 mg/dL   Microalb Creat Ratio 3 <30 mcg/mg creat  Lipid panel  Result Value Ref Range   Cholesterol 223 (H) <200 mg/dL   HDL 46 (L) > OR = 50 mg/dL   Triglycerides 71 <150 mg/dL   LDL Cholesterol (Calc) 160 (H) mg/dL (calc)   Total CHOL/HDL Ratio 4.8 <5.0 (calc)   Non-HDL Cholesterol (Calc) 177 (H) <130 mg/dL (calc)  VITAMIN D 25 Hydroxy (Vit-D Deficiency, Fractures)  Result Value Ref Range   Vit D, 25-Hydroxy 25 (L) 30 - 100 ng/mL       Assessment & Plan:   Laceration of right thumb with foreign body, nail damage status unspecified, initial encounter - thin flap of skin closely approximated, some scant bleeding, generalized ttp, no purulence, edema, induration - Plan: traMADol (ULTRAM) 50 MG tablet  Reviewed wound care extensively with pt, pt bandage removed carefully and prior to leaving new dressing applied  Encouraged her to use OTC anbx ointment for ~2 d with non-adherent gauze and bulky circular gauze dressing - secure with tape or loosely  with coband When skin flap is less moveable recommended butterfly bandaids and gauze and clean vaseline applied over with bandage for another week. The tissue is so small it may not have viable blood flow and I explained it could turn darker even black in color or appear like a scab and eventually fall off while the tissue heals more slowly from the inside out. Will likely take 2-3 weeks Offered wound care f/up appt in 1 week Currently no sign of infection - there is a slit/cut in her nail - but appears to be to acrylic portion of nail, she had no bleeding from that area and no tenderness to her thumbnail. Severe pain, encouraged bulky dressing, keeping elevated, can use tylenol, and very few tramadol were sent in for severe nighttime pain preventing sleep  F/up in 1 week wound recheck prn  Delsa Grana, PA-C 06/04/22 2:55 PM

## 2022-06-12 ENCOUNTER — Ambulatory Visit: Payer: BC Managed Care – PPO | Admitting: Family Medicine

## 2022-06-13 ENCOUNTER — Telehealth: Payer: Self-pay | Admitting: Internal Medicine

## 2022-06-13 ENCOUNTER — Ambulatory Visit (INDEPENDENT_AMBULATORY_CARE_PROVIDER_SITE_OTHER): Payer: BC Managed Care – PPO | Admitting: Family Medicine

## 2022-06-13 ENCOUNTER — Encounter: Payer: Self-pay | Admitting: Family Medicine

## 2022-06-13 VITALS — BP 130/84 | HR 99 | Temp 97.9°F | Resp 16 | Ht 66.0 in | Wt 271.1 lb

## 2022-06-13 DIAGNOSIS — S61021D Laceration with foreign body of right thumb without damage to nail, subsequent encounter: Secondary | ICD-10-CM

## 2022-06-13 DIAGNOSIS — R7303 Prediabetes: Secondary | ICD-10-CM

## 2022-06-13 DIAGNOSIS — S61021A Laceration with foreign body of right thumb without damage to nail, initial encounter: Secondary | ICD-10-CM

## 2022-06-13 NOTE — Telephone Encounter (Signed)
Dose change.

## 2022-06-13 NOTE — Telephone Encounter (Signed)
Refused Metformin 500 mg because it was discontinued 05/08/2022 due to a dose change.

## 2022-06-14 NOTE — Progress Notes (Signed)
Patient ID: Alisha Kim, female    DOB: 1970-07-26, 52 y.o.   MRN: [id number]  PCP: Delsa Grana, PA-C  Chief Complaint  Patient presents with   Follow-up   Laceration    Pt states is much better just sore    Subjective:   Alisha Kim is a 52 y.o. 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 12/05/2021   Vitamin D deficiency 05/22/2021   Mixed hyperlipidemia 07/09/2020   Current moderate episode of major depressive disorder ([place]) 02/17/2020   Grief reaction 02/17/2020   Morbid obesity ([place]) 05/31/2018   Allergic rhinitis 03/01/2018   Cervical stenosis of spinal canal 08/28/2017   S/P vaginal hysterectomy 88/89/1694   History of Helicobacter pylori infection 08/08/2016   Microcytosis 08/01/2016   Heel spur, left 04/07/2016   Bilateral foot pain 04/04/2016   Anxiety 04/04/2016   Hypertension, goal below 140/90 07/24/2015   Bilateral hip pain 11/17/2014   Atopic dermatitis 11/17/2014   Mild persistent asthma with allergic rhinitis without complication 50/38/8828      Current Outpatient Medications:    albuterol (PROAIR HFA) 108 (90 Base) MCG/ACT inhaler, Inhale 1-2 puffs into the lungs every 4 (four) hours as needed for wheezing or shortness of breath., Disp: 1 each, Rfl: 5   amLODipine (NORVASC) 5 MG tablet, TAKE 1 TABLET BY MOUTH EVERY DAY IN THE EVENING, Disp: 90 tablet, Rfl: 1   hydrOXYzine (ATARAX/VISTARIL) 10 MG tablet, Take 1-2 tablets (10-20 mg total) by mouth every 6 (six) hours as needed  for itching., Disp: 120 tablet, Rfl: 2   levocetirizine (XYZAL) 5 MG tablet, TAKE 1 TABLET BY MOUTH EVERY DAY IN THE EVENING, Disp: 90 tablet, Rfl: 0   metFORMIN (GLUCOPHAGE-XR) 750 MG 24 hr tablet, Take 1 tablet (750)  by mouth once daily with food/meal, Disp: 90 tablet, Rfl: 1   rosuvastatin (CRESTOR) 5 MG tablet, Take 1 tablet (5 mg total) by mouth daily., Disp: 90 tablet, Rfl: 3   umeclidinium-vilanterol (ANORO ELLIPTA) 62.5-25 MCG/ACT AEPB, Inhale 1 puff into the lungs daily at 6 (six) AM. (Patient taking differently: Inhale 1 puff into the lungs as needed.), Disp: 60 each, Rfl: 5   Allergies  Allergen Reactions   Meloxicam Other (See Comments)    Felt horrible, headache, bloating, back pain, SHOB; no rash   Zithromax [Azithromycin] Swelling    Tongue    Pseudoephedrine-Guaifenesin Swelling    Of tongue   Shellfish Allergy Rash   Shellfish-Derived Products Rash     Social History   Tobacco Use   Smoking status: Never    Passive exposure: Never   Smokeless tobacco: Never  Vaping Use   Vaping Use: Never used  Substance Use Topics   Alcohol use: Not Currently    Alcohol/week: 0.0 standard drinks of alcohol   Drug use: No      Chart Review Today: I personally reviewed active problem list, medication list, allergies, family history, social history, health maintenance, notes from last encounter, lab results, imaging with the patient/caregiver today.  Review of Systems  Constitutional: Negative.   HENT: Negative.    Eyes: Negative.   Respiratory: Negative.    Cardiovascular: Negative.   Gastrointestinal: Negative.   Endocrine: Negative.   Genitourinary: Negative.   Musculoskeletal: Negative.   Skin: Negative.   Allergic/Immunologic: Negative.   Neurological: Negative.   Hematological: Negative.   Psychiatric/Behavioral: Negative.    All other systems reviewed and are negative.      Objective:   Vitals:   06/13/22 1506  BP: 130/84  Pulse: 99  Resp: 16   Temp: 97.9 F (36.6 C)  TempSrc: Oral  SpO2: 98%  Weight: 271 lb 1.6 oz (123 kg)  Height: '5\' 6"'$  (1.676 m)    Body mass index is 43.76 kg/m.  Physical Exam Vitals and nursing note reviewed.  Constitutional:      Appearance: She is well-developed.  HENT:     Head: Normocephalic and atraumatic.     Nose: Nose normal.  Eyes:     General:        Right eye: No discharge.        Left eye: No discharge.     Conjunctiva/sclera: Conjunctivae normal.  Neck:     Trachea: No tracheal deviation.  Cardiovascular:     Rate and Rhythm: Normal rate and regular rhythm.  Pulmonary:     Effort: Pulmonary effort is normal. No respiratory distress.     Breath sounds: No stridor.  Musculoskeletal:        General: Normal range of motion.  Skin:    General: Skin is warm and dry.     Findings: No rash.     Comments: Laceration to thumb, long skinny flap to distal thumb along nail margin, edges well approximated, some dryness to edges, no active bleeding, no edema, erythema, induration  Neurological:     Mental Status: She is alert.     Motor: No abnormal muscle tone.     Coordination: Coordination normal.  Psychiatric:        Behavior: Behavior normal.      Results for orders placed or performed in visit on 05/05/22  COMPLETE METABOLIC PANEL WITH GFR  Result Value Ref Range   Glucose, Bld 94 65 - 99 mg/dL   BUN 11 7 - 25 mg/dL   Creat 0.61 0.50 - 1.03 mg/dL   eGFR 108 > OR = 60 mL/min/1.38m   BUN/Creatinine Ratio SEE NOTE: 6 - 22 (calc)   Sodium 139 135 - 146 mmol/L   Potassium 3.5 3.5 - 5.3 mmol/L   Chloride 105 98 - 110 mmol/L   CO2 28 20 - 32 mmol/L   Calcium 9.1 8.6 - 10.4 mg/dL   Total Protein 7.5 6.1 - 8.1 g/dL   Albumin 4.0 3.6 - 5.1 g/dL   Globulin 3.5 1.9 - 3.7 g/dL (calc)   AG Ratio 1.1 1.0 - 2.5 (calc)   Total Bilirubin 0.4 0.2 - 1.2 mg/dL   Alkaline phosphatase (APISO) 128 37 - 153 U/L   AST 36 (H) 10 - 35 U/L   ALT 62 (H) 6 - 29 U/L  Hemoglobin A1c  Result Value  Ref Range   Hgb A1c MFr Bld 6.8 (H) <5.7 % of total Hgb   Mean Plasma Glucose 148 mg/dL   eAG (mmol/L) 8.2 mmol/L  Microalbumin / creatinine urine ratio  Result Value Ref Range   Creatinine, Urine 150 20 - 275 mg/dL   Microalb, Ur 0.4 mg/dL   Microalb Creat Ratio 3 <30 mcg/mg creat  Lipid panel  Result Value Ref Range   Cholesterol 223 (H) <200 mg/dL   HDL 46 (L) > OR = 50 mg/dL   Triglycerides 71 <150 mg/dL   LDL Cholesterol (Calc) 160 (H) mg/dL (calc)   Total CHOL/HDL Ratio 4.8 <5.0 (calc)   Non-HDL Cholesterol (Calc) 177 (H) <130 mg/dL (calc)  VITAMIN D 25 Hydroxy (Vit-D Deficiency, Fractures)  Result Value Ref Range   Vit D, 25-Hydroxy 25 (L) 30 - 100 ng/mL       Assessment & Plan:   Wound recheck for laceration healing w/o sutures, just wound care and bandaging It appears to be healing well, hopefully a portion of the thin skin flap will has good blood flow and not all of it will scab/peel off.  It is still very tender, no signs of infection Recommend she keep it covered during the day to protect from bumping/pain/infection and can allow it to be open exposed to air at night. Can still use vaseline with bandages during the day    ICD-10-CM   1. Laceration of right thumb with foreign body, nail damage status unspecified, subsequent encounter  S61.021D      Healing well - see above     Delsa Grana, PA-C 06/14/22 12:11 AM

## 2022-06-23 ENCOUNTER — Ambulatory Visit
Admission: RE | Admit: 2022-06-23 | Discharge: 2022-06-23 | Disposition: A | Discharge status: Home / Self Care | Payer: BC Managed Care – PPO | Source: Ambulatory Visit | Attending: Surgery | Admitting: Surgery

## 2022-06-23 DIAGNOSIS — R921 Mammographic calcification found on diagnostic imaging of breast: Secondary | ICD-10-CM | POA: Diagnosis not present

## 2022-06-23 DIAGNOSIS — N6081 Other benign mammary dysplasias of right breast: Secondary | ICD-10-CM | POA: Insufficient documentation

## 2022-06-24 ENCOUNTER — Other Ambulatory Visit: Payer: Self-pay | Admitting: Surgery

## 2022-06-24 ENCOUNTER — Encounter: Payer: Self-pay | Admitting: Surgery

## 2022-06-24 ENCOUNTER — Encounter: Payer: Self-pay | Admitting: Family Medicine

## 2022-06-24 DIAGNOSIS — R921 Mammographic calcification found on diagnostic imaging of breast: Secondary | ICD-10-CM

## 2022-06-24 DIAGNOSIS — R928 Other abnormal and inconclusive findings on diagnostic imaging of breast: Secondary | ICD-10-CM

## 2022-07-01 ENCOUNTER — Ambulatory Visit
Admission: RE | Admit: 2022-07-01 | Discharge: 2022-07-01 | Disposition: A | Discharge status: Home / Self Care | Payer: BC Managed Care – PPO | Source: Ambulatory Visit | Attending: Surgery | Admitting: Surgery

## 2022-07-01 ENCOUNTER — Ambulatory Visit: Payer: BC Managed Care – PPO | Admitting: Surgery

## 2022-07-01 DIAGNOSIS — R921 Mammographic calcification found on diagnostic imaging of breast: Secondary | ICD-10-CM

## 2022-07-01 DIAGNOSIS — R928 Other abnormal and inconclusive findings on diagnostic imaging of breast: Secondary | ICD-10-CM

## 2022-07-01 DIAGNOSIS — N6081 Other benign mammary dysplasias of right breast: Secondary | ICD-10-CM | POA: Diagnosis not present

## 2022-07-01 HISTORY — PX: BREAST BIOPSY: SHX20

## 2022-07-01 MED ORDER — LIDOCAINE-EPINEPHRINE 1 %-1:100000 IJ SOLN
20.0000 mL | Freq: Once | INTRAMUSCULAR | Status: AC
  Administered 2022-07-01: 20 mL
  Filled 2022-07-01: qty 20

## 2022-07-01 MED ORDER — LIDOCAINE HCL (PF) 1 % IJ SOLN
5.0000 mL | Freq: Once | INTRAMUSCULAR | Status: AC
  Administered 2022-07-01: 5 mL
  Filled 2022-07-01: qty 5

## 2022-07-02 LAB — SURGICAL PATHOLOGY

## 2022-07-02 NOTE — Progress Notes (Signed)
Patient ID: Alisha Kim, female   DOB: 1970/12/07, 52 y.o.   MRN: [id number]  Chief Complaint: Flat epithelial atypia, repeat, right breast  History of Present Illness 6-monthfollow-up diagnostic mammography of the right breast after excisional biopsy for flat epithelial atypia.  Revealed a linear area of calcifications that led to a stereotactic guided core biopsy.  Pathology noted below.   Extensive discussion initiated with patient and her husband presenting together.   SURGICAL PATHOLOGY  CASE: [id number]  PATIENT: Alisha Kim Surgical Pathology Report   Specimen Submitted:  A. Breast, right upper   Clinical History: History of excisional BX for FEA.  New indeterminate  CALCS.  Post surgical CALCS, vascular, malignancy.  PREV. BX 12/04/2021  FEA x2. X-shaped clip placed following stereotactic biopsy of RIGHT  breast, upper inner quadrant.   DIAGNOSIS:  A. BREAST, RIGHT UPPER INNER QUADRANT, MIDDLE DEPTH; STEREOTACTIC CORE  NEEDLE BIOPSY:  - FLAT EPITHELIAL ATYPIA, WITH ASSOCIATED CALCIFICATIONS.  - NEGATIVE FOR DUCTAL CARCINOMA IN SITU AND MALIGNANCY.   Comment:  This case is reviewed together with selected slides from the patient's  original diagnostic biopsy (([id number].  Focal areas display a similar  pagetoid epithelial proliferation similar to that previously identified  in the upper inner quadrant, posterior depth.  The features DO NOT meet  criteria for classification of low-grade DCIS.  On initial presentation: Alisha BOTTGERis a 52y.o. female with a screening mammogram which revealed some pleomorphic calcifications in the upper inner quadrant of the right breast both anteriorly and posteriorly expanding in the area of 3.5 cm.  Both anterior and posterior extents of the calcifications were biopsied with stereotactic approach, both revealing flat epithelial atypia. She has never utilized birth control or other hormonal therapy.  She has had a hysterectomy for  endometriosis and fibroid tumors.  Her mother had breast cancer at the age of 620  She is gravida 1 para 1.  Her child is 14years of age.  No palpable lumps, nipple discharge, breast skin changes, breast pain.  She reports she does monthly breast exams and follows up with her annual mammography.  Past Medical History Past Medical History:  Diagnosis Date   Adenomyosis 09/2015   Allergy    Anemia    history of   Anxiety    controlled;    Asthma    Depression with anxiety    Diabetes mellitus without complication ([facility]    was told she was pre-diabetic   Fibroids 08/14/2016   [place] [id number]  History of Helicobacter pylori infection 08/08/2016   2004   History of kidney stones    when in her 20's   Hydrosalpinx 08/14/2016   right   Hypertension    controlled with medication;    Mixed hyperlipidemia 07/09/2020   Pre-diabetes       Past Surgical History:  Procedure Laterality Date   ABDOMINAL HYSTERECTOMY     BREAST BIOPSY Right 12/02/2021   Stereo Bx, Coil Clip, path pending   BREAST BIOPSY Right 12/02/2021   Stereo Bx, X clip, path pending   BREAST BIOPSY Right 07/01/2022   stereo bx, calcs, "X" clip-path pending   BREAST BIOPSY Right 07/01/2022   MM RT BREAST BX W LOC DEV 1ST LESION IMAGE BX SPEC STEREO GUIDE 07/01/2022 [facility]   BREAST LUMPECTOMY WITH RADIO FREQUENCY LOCALIZER Right 12/25/2021   Procedure: BREAST LUMPECTOMY WITH RADIO FREQUENCY LOCALIZER;  Surgeon: RRonny Bacon MD;  Location: [facility] ORS;  Service: General;  Laterality: Right;   BREAST LUMPECTOMY WITH RADIOFREQUENCY TAG IDENTIFICATION Right 12/19/2021   x 2 areas, bracketing   COLONOSCOPY WITH PROPOFOL N/A 05/17/2018   Procedure: COLONOSCOPY WITH PROPOFOL;  Surgeon: Lin Landsman, MD;  Location: [facility] ENDOSCOPY;  Service: Gastroenterology;  Laterality: N/A;   DENTAL SURGERY     NO PAST SURGERIES     POLYPECTOMY     TONSILLECTOMY     TUBAL LIGATION     VAGINAL HYSTERECTOMY Bilateral  09/29/2016   Procedure: HYSTERECTOMY VAGINAL WITH BILATERAL SALPINGECTOMY;  Surgeon: [name], MD;  Location: [facility] ORS;  Service: Gynecology;  Laterality: Bilateral;    Allergies  Allergen Reactions   Meloxicam Other (See Comments)    Felt horrible, headache, bloating, back pain, SHOB; no rash   Zithromax [Azithromycin] Swelling    Tongue    Pseudoephedrine-Guaifenesin Swelling    Of tongue   Shellfish Allergy Rash   Shellfish-Derived Products Rash    Current Outpatient Medications  Medication Sig Dispense Refill   albuterol (PROAIR HFA) 108 (90 Base) MCG/ACT inhaler Inhale 1-2 puffs into the lungs every 4 (four) hours as needed for wheezing or shortness of breath. 1 each 5   amLODipine (NORVASC) 5 MG tablet TAKE 1 TABLET BY MOUTH EVERY DAY IN THE EVENING 90 tablet 1   hydrOXYzine (ATARAX/VISTARIL) 10 MG tablet Take 1-2 tablets (10-20 mg total) by mouth every 6 (six) hours as needed for itching. 120 tablet 2   levocetirizine (XYZAL) 5 MG tablet TAKE 1 TABLET BY MOUTH EVERY DAY IN THE EVENING 90 tablet 0   metFORMIN (GLUCOPHAGE-XR) 750 MG 24 hr tablet Take 1 tablet (750)  by mouth once daily with food/meal 90 tablet 1   rosuvastatin (CRESTOR) 5 MG tablet Take 1 tablet (5 mg total) by mouth daily. 90 tablet 3   umeclidinium-vilanterol (ANORO ELLIPTA) 62.5-25 MCG/ACT AEPB Inhale 1 puff into the lungs daily at 6 (six) AM. (Patient taking differently: Inhale 1 puff into the lungs as needed.) 60 each 5   No current facility-administered medications for this visit.    Family History Family History  Problem Relation Age of Onset   Breast cancer Mother 53   Alcohol abuse Mother    Arthritis Mother    Asthma Mother    Depression Mother    Drug abuse Mother    Hypertension Mother    Mental illness Mother    Cancer Mother        breast and lung   Diabetes Mother    Alcohol abuse Father    Drug abuse Father    Cancer Father        lung   Hypertension Brother    Cancer Maternal  Grandmother        cervical   Heart disease Maternal Grandmother    Hypertension Maternal Grandmother    Hearing loss Maternal Aunt    Hypertension Maternal Aunt    Stroke Maternal Aunt    Alzheimer's disease Maternal Aunt    Breast cancer Maternal Aunt        mat great aunt      Social History Social History   Tobacco Use   Smoking status: Never    Passive exposure: Never   Smokeless tobacco: Never  Vaping Use   Vaping Use: Never used  Substance Use Topics   Alcohol use: Not Currently    Alcohol/week: 0.0 standard drinks of alcohol   Drug use: No        Review of Systems  All  other systems reviewed and are negative.   Physical Exam Blood pressure 133/84, pulse 81, temperature 98.7 F (37.1 C), temperature source Oral, height '5\' 6"'$  (1.676 m), weight 269 lb (122 kg), last menstrual period 09/29/2016, SpO2 97 %. Last Weight  Most recent update: 07/03/2022 10:01 AM    Weight  122 kg (269 lb)             CONSTITUTIONAL: Well developed, and nourished, appropriately responsive and aware without distress.   EYES: Sclera non-icteric.   EARS, NOSE, MOUTH AND THROAT: The oropharynx is clear. Oral mucosa is pink and moist.  Hearing is intact to voice.  NECK: Trachea is midline, and there is no jugular venous distension.  LYMPH NODES:  Lymph nodes in the neck are not enlarged. RESPIRATORY:  Lungs are clear, and breath sounds are equal bilaterally. Normal respiratory effort without pathologic use of accessory muscles. CARDIOVASCULAR: Heart is regular in rate and rhythm. GI: The abdomen is soft, nontender, and nondistended.  GU: Levada Dy present as chaperone.  Bilateral breast exam is unremarkable for masses, suspicious nodularity, densities or irregularities to skin, puckering or dimpling.  No evidence of nipple discharge.  Upper medial right breast is a well-healed scar without any remarkable dermatologic change or adjacent mass, slightly tender due to recent  biopsy. MUSCULOSKELETAL:  Symmetrical muscle tone appreciated in all four extremities.    SKIN: Skin turgor is normal. No pathologic skin lesions appreciated.  NEUROLOGIC:  Motor and sensation appear grossly normal.  Cranial nerves are grossly without defect. PSYCH:  Alert and oriented to person, place and time. Affect is appropriate for situation.  Data Reviewed I have personally reviewed what is currently available of the patient's imaging, recent labs and medical records.   Labs:     Latest Ref Rng & Units 12/19/2021    8:58 AM 05/22/2021    2:53 PM 03/19/2020    2:13 PM  CBC  WBC 4.0 - 10.5 K/uL 7.0  6.0  4.6   Hemoglobin 12.0 - 15.0 g/dL 13.4  13.7  13.6   Hematocrit 36.0 - 46.0 % 42.4  43.3  42.0   Platelets 150 - 400 K/uL 255  287  269       Latest Ref Rng & Units 05/05/2022    4:22 PM 12/19/2021    8:58 AM 05/22/2021    2:53 PM  CMP  Glucose 65 - 99 mg/dL 94  121  91   BUN 7 - 25 mg/dL '11  13  11   '$ Creatinine 0.50 - 1.03 mg/dL 0.61  0.73  0.59   Sodium 135 - 146 mmol/L 139  137  138   Potassium 3.5 - 5.3 mmol/L 3.5  4.4  3.8   Chloride 98 - 110 mmol/L 105  102  103   CO2 20 - 32 mmol/L '28  26  28   '$ Calcium 8.6 - 10.4 mg/dL 9.1  9.2  9.2   Total Protein 6.1 - 8.1 g/dL 7.5  8.1  7.6   Total Bilirubin 0.2 - 1.2 mg/dL 0.4  0.6  0.3   Alkaline Phos 38 - 126 U/L  131    AST 10 - 35 U/L 36  41  36   ALT 6 - 29 U/L 62  71  61    SURGICAL PATHOLOGY  SURGICAL PATHOLOGY  CASE: [id number]  PATIENT: Alisha Kim  Surgical Pathology Report   Specimen Submitted:  A. Breast, right upper   Clinical History: History of excisional BX for  FEA.  New indeterminate  CALCS.  Post surgical CALCS, vascular, malignancy.  PREV. BX 12/04/2021  FEA x2. X-shaped clip placed following stereotactic biopsy of RIGHT  breast, upper inner quadrant.   DIAGNOSIS:  A. BREAST, RIGHT UPPER INNER QUADRANT, MIDDLE DEPTH; STEREOTACTIC CORE  NEEDLE BIOPSY:  - FLAT EPITHELIAL ATYPIA, WITH  ASSOCIATED CALCIFICATIONS.  - NEGATIVE FOR DUCTAL CARCINOMA IN SITU AND MALIGNANCY.   Comment:  This case is reviewed together with selected slides from the patient's  original diagnostic biopsy ([id number]).  Focal areas display a similar  pagetoid epithelial proliferation similar to that previously identified  in the upper inner quadrant, posterior depth.  The features DO NOT meet  criteria for classification of low-grade DCIS.   CLINICAL DATA:  Six-month follow-up status post right breast excisional biopsy for flat epithelial atypia on final pathology on 12/25/2021. The patient has family history of breast cancer in her mother.   EXAM: DIGITAL DIAGNOSTIC UNILATERAL RIGHT MAMMOGRAM WITH TOMOSYNTHESIS   TECHNIQUE: Right digital diagnostic mammography and breast tomosynthesis was performed.   COMPARISON:  Previous exam(s).   ACR Breast Density Category c: The breast tissue is heterogeneously dense, which may obscure small masses.   FINDINGS: Spot compression magnification images over the superior central right breast demonstrates a 1.2 cm group of fine calcifications in a linear array spanning 1.2 cm. These are along the lateral aspect of the patient's surgical site.   IMPRESSION: Indeterminate 1.2 cm group of right breast calcifications.   RECOMMENDATION: Stereotactic biopsy is recommended for the right breast calcifications. We will contact the patient to schedule the procedure at her earliest convenience.   I have discussed the findings and recommendations with the patient. If applicable, a reminder letter will be sent to the patient regarding the next appointment.   BI-RADS CATEGORY  4: Suspicious.     Electronically Signed   By: Ammie Ferrier M.D.   On: 06/23/2022 15:53  Assessment    FEA in area of prior FEA excision.  No DCIS or ADH noted.  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 12/05/2021   Vitamin D deficiency 05/22/2021   Mixed hyperlipidemia 07/09/2020   Current moderate episode of major depressive disorder ([place]) 02/17/2020   Grief reaction 02/17/2020   Morbid obesity ([place]) 05/31/2018   Allergic rhinitis 03/01/2018   Cervical stenosis of spinal canal 08/28/2017   S/P vaginal hysterectomy 50/53/9767   History of Helicobacter pylori infection 08/08/2016   Microcytosis 08/01/2016   Heel spur, left 04/07/2016   Bilateral foot pain 04/04/2016   Anxiety 04/04/2016   Hypertension, goal below 140/90 07/24/2015   Bilateral hip pain 11/17/2014   Atopic dermatitis 11/17/2014   Mild persistent asthma with allergic rhinitis without complication 34/19/3790    Plan    We discussed MRI imaging, and options of proceeding with excision. Vs deferring until f/u diagnostic imaging.  We have elected to continue observation for the next six months, consider breast MRI in the interval perhaps at 3 months.  Reexcise area at that time should abnormality remain/increase.   As we have also d/w pt and husband is the high risk she falls within, and what else can be done to reduce this risk.  Although she has not had ADH, warranting estrogen  blockade, she does have a highly associated lesion with high risk lesions like ADH.  Question if estrogen blockade is warranted/trial along with weight loss to reduce her breast cancer risks.  Face-to-face time spent with the patient and her husband present was 40 minutes, with more than 50% of the time spent counseling, educating, and coordinating care of the patient.    These notes generated with voice recognition software. I apologize for typographical errors.  Ronny Bacon M.D., FACS 07/04/2022, 2:06 PM

## 2022-07-03 ENCOUNTER — Encounter: Payer: Self-pay | Admitting: Surgery

## 2022-07-03 ENCOUNTER — Ambulatory Visit (INDEPENDENT_AMBULATORY_CARE_PROVIDER_SITE_OTHER): Payer: BC Managed Care – PPO | Admitting: Surgery

## 2022-07-03 VITALS — BP 133/84 | HR 81 | Temp 98.7°F | Ht 66.0 in | Wt 269.0 lb

## 2022-07-03 DIAGNOSIS — N6081 Other benign mammary dysplasias of right breast: Secondary | ICD-10-CM

## 2022-07-03 NOTE — Patient Instructions (Signed)
If you have any concerns or questions, please feel free to call our office.   Breast Self-Awareness Breast self-awareness means being familiar with how your breasts look and feel. It involves checking your breasts regularly and telling your health care provider about any changes. Practicing breast self-awareness helps to maintain breast health. Sometimes, changes are not harmful (are benign). Other times, a change in your breasts can be a sign of a serious medical problem. Being familiar with the look and feel of your breasts can help you catch a breast problem while it is still small and can be treated. You should do breast self-exams even if you have breast implants. What you need: A mirror. A well-lit room. A pillow or other soft object. How to do a breast self-exam A breast self-exam is one way to learn what is normal for your breasts and whether your breasts are changing. To do a breast self-exam: Look for changes  Remove all the clothing above your waist. Stand in front of a mirror in a room with good lighting. Put your hands down at your sides. Compare your breasts in the mirror. Look for differences between them (asymmetry), such as: Differences in shape. Differences in size. Puckers, dips, and bumps in one breast and not the other. Look at each breast for changes in the skin, such as: Redness. Scaly areas. Skin thickening. Dimpling. Open sores (ulcers). Look for changes in your nipples, such as: Discharge. Bleeding. Dimpling. Redness. A nipple that looks pushed in (retracted), or that has changed position. Feel for changes Carefully feel your breasts for lumps and changes. It is best to do this self-exam while lying down. Follow these steps to feel each breast: Place a pillow under the shoulder of one side of your body. Place the arm of that side of your body behind your head. Feel the breast of that side of your body using the hand of the opposite arm. To do this: Start  in the nipple area and use the pads of your three middle fingers to make -inch (2 cm) overlapping circles. Use light, medium, and then firm pressure as you feel your breast, gently covering the entire breast area and armpit. Continue the overlapping circles, moving downward over the breast until you feel your ribs below your breast. Then, make circles with your fingers going upward until you reach your collarbone. Next, make circles by moving outward across your breast and into your armpit area. Squeeze the nipple. Check for discharge and lumps. Repeat steps 1-7 to check your other breast. Sit or stand in the tub or shower. With soapy water on your skin, feel each breast the same way you did when you were lying down. Write down what you find Writing down what you find can help you remember what to discuss with your health care provider. Write down: What is normal for each breast. Any changes that you find in each breast. These include: The kind of changes you find. Any pain or tenderness. Size and location of any lumps. Where you are in your menstrual cycle, if you are still getting your menstrual period (menstruating). General tips If you are breastfeeding, the best time to examine your breasts is after a feeding or after using a breast pump. If you menstruate, the best time to examine your breasts is 5-7 days after your menstrual period. Breasts are generally lumpier during menstrual periods, and it may be more difficult to notice changes. With time and practice, you will become more familiar  with the differences in your breasts and more comfortable with the exam. Contact a health care provider if: You see a change in the shape or size of your breasts or nipples. You see a change in the skin of your breast or nipples, such as a reddened or scaly area. You have unusual discharge from your nipples. You find a new lump or thick area. You have breast pain. You have any concerns about your  breast health. Summary Breast self-awareness includes looking for physical changes in your breasts and feeling for any changes within your breasts. Breast self-awareness should be done in front of a mirror in a well-lit room. If you menstruate, the best time to examine your breasts is 5-7 days after your menstrual period. Tell your health care provider about any changes you notice in your breasts. Changes include changes in size, changes on the skin, pain or tenderness, or unusual fluid from your nipples. This information is not intended to replace advice given to you by your health care provider. Make sure you discuss any questions you have with your health care provider. Document Revised: 10/31/2021 Document Reviewed: 03/28/2021 Elsevier Patient Education  Alisha Kim.

## 2022-07-09 ENCOUNTER — Inpatient Hospital Stay: Payer: BC Managed Care – PPO

## 2022-07-09 ENCOUNTER — Inpatient Hospital Stay: Payer: BC Managed Care – PPO | Attending: Internal Medicine | Admitting: Internal Medicine

## 2022-07-09 ENCOUNTER — Encounter: Payer: Self-pay | Admitting: Internal Medicine

## 2022-07-09 VITALS — BP 142/94 | HR 86 | Temp 97.8°F | Resp 16 | Wt 268.5 lb

## 2022-07-09 DIAGNOSIS — N6081 Other benign mammary dysplasias of right breast: Secondary | ICD-10-CM | POA: Diagnosis not present

## 2022-07-09 DIAGNOSIS — D241 Benign neoplasm of right breast: Secondary | ICD-10-CM | POA: Insufficient documentation

## 2022-07-09 DIAGNOSIS — E669 Obesity, unspecified: Secondary | ICD-10-CM | POA: Insufficient documentation

## 2022-07-09 DIAGNOSIS — J45909 Unspecified asthma, uncomplicated: Secondary | ICD-10-CM | POA: Diagnosis not present

## 2022-07-09 NOTE — Assessment & Plan Note (Addendum)
#  Flat epithelial atypia of the right breast status X 2 [JULY 2023 s/p excisional biopsy; and s/p core biopsy JAN 2024].  No evidence of ALH or DCIS noted.  # Again reviewed with the patient that FEA is equivalent to that of benign proliferative disease without atypia  there are no data to support the use of risk-reducing medications on the basis of current diagnosis.  Hence patient will not require any endocrine therapy for treatment or prophylaxis/risk reduction.    # Obesity [Asthma/inhlaers-prediabetic/metformin].  Discussed importance of healthy weight/and weight loss.  Strongly recommend eating more green leafy vegetables and cutting down processed food/ carbohydrates.  Instead increasing whole grains / protein in the diet.  Multiple studies have shown that optimal weight would help improve cardiovascular risk; also shown to cut on the risk of malignancies-colon cancer, breast cancer ovarian/uterine cancer in women  Recommend evaluation with obesity medicine clinic/defer to PCP regarding the referral.    # DISPOSITION: # no labs # follow up as needed-Dr.B  C[facility]; [place]; [place].

## 2022-07-09 NOTE — Progress Notes (Signed)
Last seen in 01/2022.  Here for flat epithelial atypia, repeat, right breast evaluation.

## 2022-07-09 NOTE — Progress Notes (Signed)
[place] CONSULT NOTE  Patient Care Team: Delsa Grana, PA-C as PCP - General (Family Medicine)  CHIEF COMPLAINTS/PURPOSE OF CONSULTATION: Flat epithelial atypia/status post lumpectomy of the breast  Oncology History   No history exists.   SURGICAL PATHOLOGY  CASE: [id number]  PATIENT: Alisha Kim  Surgical Pathology Report      Specimen Submitted:  A. Breast, right anterior  B. Breast, right posterior   Clinical History: CALCS seen at [facility].  1, 2 PROB DCIS. A - Coil-shaped  clip placed following stereotactic biopsy of RIGHT breast, upper inner  quadrant, anterior. B - X-shaped clip placed following stereotactic  biopsy of RIGHT breast, upper inner quadrant, posterior.     DIAGNOSIS:  A. 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  - BACKGROUND BENIGN MAMMARY PARENCHYMA WITH MILD STROMAL FIBROSIS,  FIBROCYSTIC CHANGES, AND FOCAL BENIGN ADENOSIS.  - NEGATIVE FOR DUCTAL CARCINOMA IN SITU AND MALIGNANCY.   B. 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  - BACKGROUND BENIGN MAMMARY PARENCHYMA WITH MILD STROMAL FIBROSIS AND  FIBROCYSTIC CHANGES.  - NEGATIVE FOR DEFINITE DUCTAL CARCINOMA IN SITU AND MALIGNANCY.   Comment:  The majority of the atypical changes in B are compatible with standard  flat epithelial atypia (FEA). However, one block (B4) displays an  underlying pagetoid epithelial proliferation that borders on, but is not  diagnostic of ductal carcinoma in situ. The findings are concerning for  potential low grade DCIS associated elsewhere within this FEA. Clinical  and radiographic correlation is essential.  SURGICAL PATHOLOGY  CASE: [id number]  PATIENT: Alisha Kim  Surgical Pathology Report      Specimen Submitted:  A. Breast, right upper medial   Clinical History: Right breast mass       DIAGNOSIS:  A. BREAST, RIGHT, UPPER MEDIAL, EXCISION:  - RESIDUAL FLAT EPITHELIAL ATYPIA AND FOCAL MICROCALCIFICATIONS  ASSOCIATED WITH EDGES OF PREVIOUS BIOPSY SITES.  - 2 BIOPSY CLIPS IDENTIFIED.  - FIBROCYSTIC CHANGES AND FOCAL FIBROADENOMATOID CHANGE.  - SURGICAL MARGINS NEGATIVE FOR ATYPIA AND MALIGNANCY.  HISTORY OF PRESENTING ILLNESS: Ambulating independently.  Accompanied by husband.  Alisha Kim 52 y.o.  female with no prior history of malignancy  is here for a follow up of  flat epithelial atypia.  Patient had a biopsy of the right breast that again showed flat epithelial atypia without any DCIS or any malignancy.  Patient denies any symptoms.  Review of Systems  Constitutional:  Negative for chills, diaphoresis, fever, malaise/fatigue and weight loss.  HENT:  Negative for nosebleeds and sore throat.   Eyes:  Negative for double vision.  Respiratory:  Negative for cough, hemoptysis, sputum production, shortness of breath and wheezing.   Cardiovascular:  Negative for chest pain, palpitations, orthopnea and leg swelling.  Gastrointestinal:  Negative for abdominal pain, blood in stool, constipation, diarrhea, heartburn, melena, nausea and vomiting.  Genitourinary:  Negative for dysuria, frequency and urgency.  Musculoskeletal:  Negative for back pain and joint pain.  Skin: Negative.  Negative for itching and rash.  Neurological:  Negative for dizziness, tingling, focal weakness, weakness and headaches.  Endo/Heme/Allergies:  Does not bruise/bleed easily.  Psychiatric/Behavioral:  Negative for depression. The patient is not nervous/anxious and does not have insomnia.     MEDICAL HISTORY:  Past Medical  History:  Diagnosis Date   Adenomyosis 09/2015   Allergy    Anemia    history of   Anxiety    controlled;    Asthma     Depression with anxiety    Diabetes mellitus without complication [facility])    was told she was pre-diabetic   Fibroids 08/14/2016   [place] March 1497   History of Helicobacter pylori infection 08/08/2016   2004   History of kidney stones    when in her 20's   Hydrosalpinx 08/14/2016   right   Hypertension    controlled with medication;    Mixed hyperlipidemia 07/09/2020   Pre-diabetes     SURGICAL HISTORY: Past Surgical History:  Procedure Laterality Date   ABDOMINAL HYSTERECTOMY     BREAST BIOPSY Right 12/02/2021   Stereo Bx, Coil Clip, path pending   BREAST BIOPSY Right 12/02/2021   Stereo Bx, X clip, path pending   BREAST BIOPSY Right 07/01/2022   stereo bx, calcs, "X" clip-path pending   BREAST BIOPSY Right 07/01/2022   MM RT BREAST BX W LOC DEV 1ST LESION IMAGE BX SPEC STEREO GUIDE 07/01/2022 [facility]   BREAST LUMPECTOMY WITH RADIO FREQUENCY LOCALIZER Right 12/25/2021   Procedure: BREAST LUMPECTOMY WITH RADIO FREQUENCY LOCALIZER;  Surgeon: Ronny Bacon, MD;  Location: [facility] ORS;  Service: General;  Laterality: Right;   BREAST LUMPECTOMY WITH RADIOFREQUENCY TAG IDENTIFICATION Right 12/19/2021   x 2 areas, bracketing   COLONOSCOPY WITH PROPOFOL N/A 05/17/2018   Procedure: COLONOSCOPY WITH PROPOFOL;  Surgeon: Lin Landsman, MD;  Location: [facility] ENDOSCOPY;  Service: Gastroenterology;  Laterality: N/A;   DENTAL SURGERY     NO PAST SURGERIES     POLYPECTOMY     TONSILLECTOMY     TUBAL LIGATION     VAGINAL HYSTERECTOMY Bilateral 09/29/2016   Procedure: HYSTERECTOMY VAGINAL WITH BILATERAL SALPINGECTOMY;  Surgeon: [name], MD;  Location: [facility] ORS;  Service: Gynecology;  Laterality: Bilateral;    SOCIAL HISTORY: Social History   Socioeconomic History   Marital status: Married    Spouse name: Jeneen Rinks   Number of children: 1   Years of education: Not on file   Highest education level: Not on file  Occupational History   Not on file  Tobacco Use   Smoking  status: Never    Passive exposure: Never   Smokeless tobacco: Never  Vaping Use   Vaping Use: Never used  Substance and Sexual Activity   Alcohol use: Not Currently    Alcohol/week: 0.0 standard drinks of alcohol   Drug use: No   Sexual activity: Yes    Partners: Male    Birth control/protection: None, Surgical  Other Topics Concern   Not on file  Social History Narrative   [place] in [place]; 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 Never smoked; no alcohol. Daughter- 26 [2023]   Social Determinants of Health   Financial Resource Strain: Low Risk  (02/24/2020)   Overall Financial Resource Strain (CARDIA)    Difficulty of Paying Living Expenses: Not hard at all  Food Insecurity: No Food Insecurity (02/24/2020)   Hunger Vital Sign    Worried About Running Out of Food in the Last Year: Never true    Ran Out of Food in the Last Year: Never true  Transportation Needs: No Transportation Needs (02/24/2020)   PRAPARE - Hydrologist (Medical): No    Lack of Transportation (Non-Medical): No  Physical Activity: Not on file  Stress: Stress Concern Present (  02/24/2020)   [place]    Feeling of Stress : To some extent  Social Connections: Moderately Isolated (02/24/2020)   Social Connection and Isolation Panel [NHANES]    Frequency of Communication with Friends and Family: More than three times a week    Frequency of Social Gatherings with Friends and Family: More than three times a week    Attends Religious Services: Never    Marine scientist or Organizations: No    Attends Archivist Meetings: Never    Marital Status: Married  Human resources officer Violence: Not At Risk (02/24/2020)   Humiliation, Afraid, Rape, and Kick questionnaire    Fear of Current or Ex-Partner: No    Emotionally Abused: No    Physically Abused: No    Sexually Abused: No    FAMILY HISTORY: Family History   Problem Relation Age of Onset   Breast cancer Mother 33   Alcohol abuse Mother    Arthritis Mother    Asthma Mother    Depression Mother    Drug abuse Mother    Hypertension Mother    Mental illness Mother    Cancer Mother        breast and lung   Diabetes Mother    Alcohol abuse Father    Drug abuse Father    Cancer Father        lung   Hypertension Brother    Cancer Maternal Grandmother        cervical   Heart disease Maternal Grandmother    Hypertension Maternal Grandmother    Hearing loss Maternal Aunt    Hypertension Maternal Aunt    Stroke Maternal Aunt    Alzheimer's disease Maternal Aunt    Breast cancer Maternal Aunt        mat great aunt    ALLERGIES:  is allergic to meloxicam, zithromax [azithromycin], pseudoephedrine-guaifenesin, shellfish allergy, and shellfish-derived products.  MEDICATIONS:  Current Outpatient Medications  Medication Sig Dispense Refill   albuterol (PROAIR HFA) 108 (90 Base) MCG/ACT inhaler Inhale 1-2 puffs into the lungs every 4 (four) hours as needed for wheezing or shortness of breath. 1 each 5   amLODipine (NORVASC) 5 MG tablet TAKE 1 TABLET BY MOUTH EVERY DAY IN THE EVENING 90 tablet 1   hydrOXYzine (ATARAX/VISTARIL) 10 MG tablet Take 1-2 tablets (10-20 mg total) by mouth every 6 (six) hours as needed for itching. 120 tablet 2   levocetirizine (XYZAL) 5 MG tablet TAKE 1 TABLET BY MOUTH EVERY DAY IN THE EVENING 90 tablet 0   metFORMIN (GLUCOPHAGE-XR) 750 MG 24 hr tablet Take 1 tablet (750)  by mouth once daily with food/meal 90 tablet 1   rosuvastatin (CRESTOR) 5 MG tablet Take 1 tablet (5 mg total) by mouth daily. 90 tablet 3   umeclidinium-vilanterol (ANORO ELLIPTA) 62.5-25 MCG/ACT AEPB Inhale 1 puff into the lungs daily at 6 (six) AM. (Patient taking differently: Inhale 1 puff into the lungs as needed.) 60 each 5   No current facility-administered medications for this visit.    PHYSICAL EXAMINATION: ECOG PERFORMANCE STATUS: 0 -  Asymptomatic  Vitals:   07/09/22 1300  BP: (!) 142/94  Pulse: 86  Resp: 16  Temp: 97.8 F (36.6 C)   Filed Weights   07/09/22 1300  Weight: 268 lb 8 oz (121.8 kg)    Physical Exam Vitals and nursing note reviewed.  HENT:     Head: Normocephalic and atraumatic.  Mouth/Throat:     Pharynx: Oropharynx is clear.  Eyes:     Extraocular Movements: Extraocular movements intact.     Pupils: Pupils are equal, round, and reactive to light.  Cardiovascular:     Rate and Rhythm: Normal rate and regular rhythm.  Pulmonary:     Comments: Decreased breath sounds bilaterally.  Abdominal:     Palpations: Abdomen is soft.  Musculoskeletal:        General: Normal range of motion.     Cervical back: Normal range of motion.  Skin:    General: Skin is warm.  Neurological:     General: No focal deficit present.     Mental Status: She is alert and oriented to person, place, and time.  Psychiatric:        Behavior: Behavior normal.        Judgment: Judgment normal.     LABORATORY DATA:  I have reviewed the data as listed Lab Results  Component Value Date   WBC 7.0 12/19/2021   HGB 13.4 12/19/2021   HCT 42.4 12/19/2021   MCV 80.5 12/19/2021   PLT 255 12/19/2021   Recent Labs    12/19/21 0858 05/05/22 1622  NA 137 139  K 4.4 3.5  CL 102 105  CO2 26 28  GLUCOSE 121* 94  BUN 13 11  CREATININE 0.73 0.61  CALCIUM 9.2 9.1  GFRNONAA >60  --   PROT 8.1 7.5  ALBUMIN 3.7  --   AST 41 36*  ALT 71* 62*  ALKPHOS 131*  --   BILITOT 0.6 0.4    RADIOGRAPHIC STUDIES: I have personally reviewed the radiological images as listed and agreed with the findings in the report. MM RT BREAST BX W LOC DEV 1ST LESION IMAGE BX SPEC STEREO GUIDE  Addendum Date: 07/02/2022   ADDENDUM REPORT: 07/02/2022 12:26 ADDENDUM: PATHOLOGY revealed: A. BREAST, RIGHT UPPER INNER QUADRANT, MIDDLE DEPTH; STEREOTACTIC CORE NEEDLE BIOPSY: - FLAT EPITHELIAL ATYPIA, WITH ASSOCIATED CALCIFICATIONS. - NEGATIVE  FOR DUCTAL CARCINOMA IN SITU AND MALIGNANCY. Comment: This case is reviewed together with selected slides from the patient's original diagnostic biopsy ([id number]). 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 The features DO NOT meet criteria for classification of low-grade DCIS. Pathology results are CONCORDANT with imaging findings, per Dr. Valentino Saxon. Pathology results and recommendations were discussed with patient via telephone on 07/02/2022. Patient reported biopsy site doing well with no adverse symptoms, and only slight tenderness at the site. Post biopsy care instructions were reviewed, questions were answered and my direct phone number was provided. Patient was instructed to call [facility] for any additional questions or concerns related to biopsy site. RECOMMENDATION: 1. Surgical consultation for possible excision. Patient stated she has scheduled appointment with provider/surgeon, Dr. Ronny Bacon, on 07/03/2022 at 8:30 am to discuss biopsy results and treatment plan. 2. Recommend consideration of high risk screening protocol including annual bilateral breast MRI given patient's multiple diagnoses of atypia from breast biopsy on 12/04/2021 and again on 07/01/2022. Pathology results reported by Electa Sniff RN on 07/02/2022. Electronically Signed   By: Valentino Saxon M.D.   On: 07/02/2022 12:26   Result Date: 07/02/2022 CLINICAL DATA:  History of an excisional biopsy for FEA. New indeterminate calcifications. EXAM: RIGHT BREAST STEREOTACTIC CORE NEEDLE BIOPSY COMPARISON:  Previous exam(s). FINDINGS: The patient and I discussed the procedure of stereotactic-guided biopsy including benefits and alternatives. We discussed the high  likelihood of a successful procedure. We discussed the risks of the procedure including infection, bleeding, tissue injury, clip migration, and inadequate  sampling. Informed written consent was given. The usual time out protocol was performed immediately prior to the procedure. Using sterile technique and 1% lidocaine and 1% lidocaine with epinephrine as local anesthetic, under stereotactic guidance, a 9 gauge vacuum assisted device was used to perform core needle biopsy of calcifications in the upper RIGHT breast using a superior approach. Specimen radiograph was performed showing representative calcifications in 5 of 6 samples. Specimens with calcifications are identified for pathology. Lesion quadrant: Upper inner quadrant At the conclusion of the procedure, an X shaped tissue marker clip was deployed into the biopsy cavity. Follow-up 2-view mammogram was performed and dictated separately. IMPRESSION: Stereotactic-guided biopsy of indeterminate calcifications. No apparent complications. Electronically Signed: By: Valentino Saxon M.D. On: 07/01/2022 08:38   MM CLIP PLACEMENT RIGHT  Result Date: 07/01/2022 CLINICAL DATA:  Status post stereotactic guided biopsy EXAM: 3D DIAGNOSTIC RIGHT MAMMOGRAM POST STEREOTACTIC BIOPSY COMPARISON:  Previous exam(s). FINDINGS: 3D Mammographic images were obtained following stereotactic guided biopsy of calcifications. The X shaped biopsy marking clip is favored mildly inferiorly displaced by approximately 5-8 mm from residual calcifications. IMPRESSION: Mild inferior displacement of an X shaped biopsy marking clip from residual calcifications. Final Assessment: Post Procedure Mammograms for Marker Placement Electronically Signed   By: Valentino Saxon M.D.   On: 07/01/2022 08:36  MM DIAG BREAST TOMO UNI RIGHT  Result Date: 06/23/2022 CLINICAL DATA:  Six-month follow-up status post right breast excisional biopsy for flat epithelial atypia on final pathology on 12/25/2021. The patient has family history of breast cancer in her mother. EXAM: DIGITAL DIAGNOSTIC UNILATERAL RIGHT MAMMOGRAM WITH TOMOSYNTHESIS TECHNIQUE: Right  digital diagnostic mammography and breast tomosynthesis was performed. COMPARISON:  Previous exam(s). ACR Breast Density Category c: The breast tissue is heterogeneously dense, which may obscure small masses. FINDINGS: Spot compression magnification images over the superior central right breast demonstrates a 1.2 cm group of fine calcifications in a linear array spanning 1.2 cm. These are along the lateral aspect of the patient's surgical site. IMPRESSION: Indeterminate 1.2 cm group of right breast calcifications. RECOMMENDATION: Stereotactic biopsy is recommended for the right breast calcifications. We will contact the patient to schedule the procedure at her earliest convenience. I have discussed the findings and recommendations with the patient. If applicable, a reminder letter will be sent to the patient regarding the next appointment. BI-RADS CATEGORY  4: Suspicious. Electronically Signed   By: Ammie Ferrier M.D.   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  No evidence of ALH or DCIS noted.  # Again reviewed with the patient that FEA is equivalent to that of benign proliferative disease without atypia  there are no data to support the use of risk-reducing medications on the basis of current diagnosis.  Hence patient will not require any endocrine therapy for treatment or prophylaxis/risk reduction.    # Obesity [Asthma/inhlaers-prediabetic/metformin].  Discussed importance of healthy weight/and weight loss.  Strongly recommend eating more green leafy vegetables and cutting down processed food/ carbohydrates.  Instead increasing whole grains / protein in the diet.  Multiple studies have shown that optimal weight would help improve cardiovascular risk; also shown to cut on the risk of malignancies-colon cancer, breast cancer ovarian/uterine cancer in women   Recommend  evaluation with obesity medicine clinic/defer to PCP regarding the referral.    # DISPOSITION: # no labs # follow up as needed-Dr.B  Cc; [place]; [place].    Cammie Sickle, MD 07/09/2022 3:59 PM

## 2022-07-25 ENCOUNTER — Other Ambulatory Visit: Payer: Self-pay | Admitting: Family Medicine

## 2022-07-25 DIAGNOSIS — F331 Major depressive disorder, recurrent, moderate: Secondary | ICD-10-CM

## 2022-07-25 DIAGNOSIS — F419 Anxiety disorder, unspecified: Secondary | ICD-10-CM

## 2022-08-15 ENCOUNTER — Ambulatory Visit
Admission: RE | Admit: 2022-08-15 | Discharge: 2022-08-15 | Disposition: A | Discharge status: Home / Self Care | Payer: BC Managed Care – PPO | Source: Ambulatory Visit | Attending: Family Medicine | Admitting: Family Medicine

## 2022-08-15 ENCOUNTER — Ambulatory Visit
Admission: RE | Admit: 2022-08-15 | Discharge: 2022-08-15 | Disposition: A | Discharge status: Home / Self Care | Payer: BC Managed Care – PPO | Attending: Family Medicine | Admitting: Family Medicine

## 2022-08-15 ENCOUNTER — Ambulatory Visit (INDEPENDENT_AMBULATORY_CARE_PROVIDER_SITE_OTHER): Payer: BC Managed Care – PPO | Admitting: Family Medicine

## 2022-08-15 ENCOUNTER — Encounter: Payer: Self-pay | Admitting: Family Medicine

## 2022-08-15 VITALS — BP 132/76 | HR 97 | Temp 97.8°F | Resp 16 | Ht 66.0 in | Wt 265.1 lb

## 2022-08-15 DIAGNOSIS — R197 Diarrhea, unspecified: Secondary | ICD-10-CM

## 2022-08-15 DIAGNOSIS — R109 Unspecified abdominal pain: Secondary | ICD-10-CM

## 2022-08-15 LAB — POCT URINALYSIS DIPSTICK
Glucose, UA: NEGATIVE
Ketones, UA: NEGATIVE
Leukocytes, UA: NEGATIVE
Nitrite, UA: NEGATIVE
Protein, UA: NEGATIVE
Spec Grav, UA: 1.02 (ref 1.010–1.025)
Urobilinogen, UA: 0.2 E.U./dL
pH, UA: 5 (ref 5.0–8.0)

## 2022-08-15 MED ORDER — FAMOTIDINE 20 MG PO TABS: 20.0000 mg | ORAL_TABLET | Freq: Two times a day (BID) | ORAL | 1 refills | Status: DC

## 2022-08-15 MED ORDER — DICYCLOMINE HCL 20 MG PO TABS: 20.0000 mg | ORAL_TABLET | Freq: Three times a day (TID) | ORAL | 1 refills | Status: DC | PRN

## 2022-08-15 NOTE — Progress Notes (Unsigned)
Patient ID: Alisha Kim, female    DOB: June 12, 1970, 52 y.o.   MRN: [id number]  PCP: Delsa Grana, PA-C  Chief Complaint  Patient presents with   Abdominal Pain   Diarrhea    Onset for 2 weeks getting worst   Ear Fullness    Feels pressure on right ear    Subjective:   Alisha Kim is a 52 y.o. female, presents to clinic with CC of the following:  Abd pain diarrhea onset about 2 weeks ago, first started with episode of constipation and she treated with "laxative" (dolculax pink - docusate non stimulant) 2x then after she stopped meds but continued to have diarrhea.  She also ws pushing fluids and fiber, she started to get rectal inflammation/pain needing tucks wipes  Abdominal Pain This is a new problem. The current episode started 1 to 4 weeks ago. The onset quality is gradual. The problem has been gradually worsening. The pain is located in the left flank, LLQ and RLQ. The pain is at a severity of 7/10. The quality of the pain is cramping. Associated symptoms include constipation, diarrhea and flatus. Pertinent negatives include no anorexia, arthralgias, belching, dysuria, fever, frequency, headaches, hematochezia, hematuria, melena, myalgias, nausea, vomiting or weight loss. The pain is aggravated by eating and bowel movement. Relieved by: nothing, just not eating. She has tried nothing for the symptoms. The treatment provided no relief.  Diarrhea  Associated symptoms include abdominal pain and increased flatus. Pertinent negatives include no arthralgias, fever, headaches, myalgias, vomiting or weight loss.  Ear Fullness  Associated symptoms include abdominal pain and diarrhea. Pertinent negatives include no headaches or vomiting.      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 12/05/2021   Vitamin D deficiency 05/22/2021   Mixed hyperlipidemia 07/09/2020   Current moderate episode of major depressive disorder ([place]) 02/17/2020   Grief reaction 02/17/2020   Morbid obesity ([place]) 05/31/2018   Allergic rhinitis 03/01/2018   Cervical stenosis of spinal canal 08/28/2017   S/P vaginal hysterectomy [id number]   History of Helicobacter pylori infection 08/08/2016   Microcytosis 08/01/2016   Heel spur, left 04/07/2016   Bilateral foot pain 04/04/2016   Anxiety 04/04/2016   Hypertension, goal below 140/90 07/24/2015   Bilateral hip pain 11/17/2014   Atopic dermatitis 11/17/2014   Mild persistent asthma with allergic rhinitis without complication [id number]      Current Outpatient Medications:    albuterol (PROAIR HFA) 108 (90 Base) MCG/ACT inhaler, Inhale 1-2 puffs into the lungs every 4 (four) hours as needed for wheezing or shortness of breath., Disp: 1 each, Rfl: 5   amLODipine (NORVASC) 5 MG tablet, TAKE 1 TABLET BY MOUTH EVERY DAY IN THE EVENING, Disp: 90 tablet, Rfl: 1   hydrOXYzine (ATARAX/VISTARIL) 10 MG tablet, Take 1-2 tablets (10-20 mg total) by mouth every 6 (six) hours as needed for itching., Disp: 120 tablet, Rfl: 2   levocetirizine (XYZAL) 5 MG tablet, TAKE 1 TABLET BY MOUTH EVERY DAY IN THE EVENING, Disp: 90 tablet, Rfl: 0   metFORMIN (GLUCOPHAGE-XR) 750 MG 24 hr tablet, Take 1 tablet (750)  by mouth once daily with food/meal, Disp: 90 tablet, Rfl: 1   rosuvastatin (CRESTOR) 5 MG tablet, Take 1 tablet (5  mg total) by mouth daily., Disp: 90 tablet, Rfl: 3   umeclidinium-vilanterol (ANORO ELLIPTA) 62.5-25 MCG/ACT AEPB, Inhale 1 puff into the lungs daily at 6 (six) AM. (Patient taking differently: Inhale 1 puff into the lungs as needed.), Disp: 60 each, Rfl: 5   Allergies  Allergen Reactions   Meloxicam Other (See Comments)    Felt horrible, headache, bloating, back pain, SHOB; no  rash   Zithromax [Azithromycin] Swelling    Tongue    Pseudoephedrine-Guaifenesin Swelling    Of tongue   Shellfish Allergy Rash   Shellfish-Derived Products Rash     Social History   Tobacco Use   Smoking status: Never    Passive exposure: Never   Smokeless tobacco: Never  Vaping Use   Vaping Use: Never used  Substance Use Topics   Alcohol use: Not Currently    Alcohol/week: 0.0 standard drinks of alcohol   Drug use: No      Chart Review Today: I personally reviewed active problem list, medication list, allergies, family history, social history, health maintenance, notes from last encounter, lab results, imaging with the patient/caregiver today.   Review of Systems  Constitutional: Negative.  Negative for activity change, appetite change, fatigue, fever, unexpected weight change and weight loss.  HENT: Negative.    Eyes: Negative.   Respiratory: Negative.  Negative for shortness of breath and wheezing.   Cardiovascular: Negative.   Gastrointestinal:  Positive for abdominal pain, constipation, diarrhea and flatus. Negative for anorexia, hematochezia, melena, nausea and vomiting.  Endocrine: Negative.   Genitourinary: Negative.  Negative for difficulty urinating, dysuria, enuresis, frequency, hematuria and menstrual problem.  Musculoskeletal: Negative.  Negative for arthralgias and myalgias.  Skin: Negative.  Negative for color change and pallor.  Allergic/Immunologic: Negative.   Neurological: Negative.  Negative for syncope, weakness, light-headedness and headaches.  Hematological: Negative.   Psychiatric/Behavioral: Negative.  Negative for behavioral problems, decreased concentration, dysphoric mood, self-injury, sleep disturbance and suicidal ideas. The patient is not nervous/anxious and is not hyperactive.   All other systems reviewed and are negative.      Objective:   Vitals:   08/15/22 1342  BP: 132/76  Pulse: 97  Resp: 16  Temp: 97.8 F (36.6 C)   TempSrc: Oral  SpO2: 100%  Weight: 265 lb 1.6 oz (120.2 kg)  Height: '5\' 6"'$  (1.676 m)    Body mass index is 42.79 kg/m.  Physical Exam Vitals and nursing note reviewed.  Constitutional:      General: She is not in acute distress.    Appearance: Normal appearance. She is well-developed. She is obese. She is not ill-appearing, toxic-appearing or diaphoretic.     Interventions: Face mask in place.  HENT:     Head: Normocephalic and atraumatic.     Right Ear: External ear normal.     Left Ear: External ear normal.     Mouth/Throat:     Mouth: Mucous membranes are moist.     Pharynx: Oropharynx is clear. No oropharyngeal exudate or posterior oropharyngeal erythema.  Eyes:     General: Lids are normal. No scleral icterus.       Right eye: No discharge.        Left eye: No discharge.     Conjunctiva/sclera: Conjunctivae normal.  Neck:     Trachea: Phonation normal. No tracheal deviation.  Cardiovascular:     Rate and Rhythm: Normal rate and regular rhythm.     Pulses: Normal pulses.  Radial pulses are 2+ on the right side and 2+ on the left side.       Posterior tibial pulses are 2+ on the right side and 2+ on the left side.     Heart sounds: Normal heart sounds. No murmur heard.    No friction rub. No gallop.  Pulmonary:     Effort: Pulmonary effort is normal. No respiratory distress.     Breath sounds: Normal breath sounds. No stridor. No wheezing, rhonchi or rales.  Chest:     Chest wall: No tenderness.  Abdominal:     General: Bowel sounds are normal. There is no distension.     Palpations: Abdomen is soft.     Tenderness: There is no abdominal tenderness. There is no right CVA tenderness, left CVA tenderness, guarding or rebound.  Musculoskeletal:     Right lower leg: No edema.     Left lower leg: No edema.  Skin:    General: Skin is warm and dry.     Coloration: Skin is not jaundiced or pale.     Findings: No rash.  Neurological:     Mental Status: She is  alert.     Motor: No abnormal muscle tone.     Gait: Gait normal.  Psychiatric:        Mood and Affect: Mood normal.        Speech: Speech normal.        Behavior: Behavior normal.      Results for orders placed or performed during the hospital encounter of 07/01/22  Surgical pathology  Result Value Ref Range   SURGICAL PATHOLOGY      SURGICAL PATHOLOGY CASE: [id number] PATIENT: Rexford Maus Surgical Pathology Report     Specimen Submitted: A. Breast, right upper  Clinical History: History of excisional BX for FEA.  New indeterminate CALCS.  Post surgical CALCS, vascular, malignancy.  PREV. BX 12/04/2021 FEA x2. X-shaped clip placed following stereotactic biopsy of RIGHT breast, upper inner quadrant.    DIAGNOSIS: A. BREAST, RIGHT UPPER INNER QUADRANT, MIDDLE DEPTH; STEREOTACTIC CORE NEEDLE BIOPSY: - FLAT EPITHELIAL ATYPIA, WITH ASSOCIATED CALCIFICATIONS. - NEGATIVE FOR DUCTAL CARCINOMA IN SITU AND MALIGNANCY.  Comment: This case is reviewed together with selected slides from the patient's original diagnostic biopsy [id number]).  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  The features DO NOT meet criteria for classification of low-grade DCIS.  GROSS DESCRIPTION: A. Labeled: Ri ght breast stereo biopsy calcs upper mid depth Received: in a formalin-filled [place] collection device Specimen radiograph image(s) available for review Time/Date in fixative: Collected at 8:18 AM on 07/01/2022 and placed in formalin at 8:21 AM on 07/01/2022 Cold ischemic time: Approximately 3 minutes Total fixation time: Approximately 9 hours Core pieces: Multiple Measurement: Aggregate, 1.2 x 1.0 x 0.8 cm Description / comments: Received are yellow fibrofatty tissue fragments. A diagram is provided on the specimen container lid, and sections A, B, D, E, and F are checked Inked:  Green Entirely submitted in cassette(s):  1-section A 2-section B 3-section D 4-section E 5-section F 6-sections C and G  CM 07/01/2022  Final Diagnosis performed by Allena Napoleon, MD.   Electronically signed 07/02/2022 8:55:13AM The electronic signature indicates that the named Attending Pathologist has evaluated the specimen Technical component performed at [facility], [address] [place], [place] [address] Lab: [id number] Dir: Rush Farmer, MD, MMM  Professional component performed at [facility], [facility], [place], [place], [place] [address] Lab: [phone] Dir: Kathi Simpers, MD        Assessment & Plan:   1. Diarrhea, unspecified type Onset 2 weeks ago, still having watery BM frequently, no abd ttp on exam, pt appears euvolemic/well hydrated, abd NTND Slowly advance diet with meds for calming gut  Suspect GI virus.   Can do stool testing if diarrhea does not resolve or improve over the next 2 weeks - no travel, concerning food intake, or other infectious etiology - no recent Abx No blood in stool Basic labs - sx started with severe constipation that she then improved with 1-2 doses of OTC stool softeners/laxatives - then she's had watery diarrhea - assess stool/bowel pattern with xray  - CBC with Differential/Platelet - COMPLETE METABOLIC PANEL WITH GFR - Lipase - dicyclomine (BENTYL) 20 MG tablet; Take 1 tablet (20 mg total) by mouth 3 (three) times daily as needed for spasms (abd cramping).  Dispense: 20 tablet; Refill: 1 - famotidine (PEPCID) 20 MG tablet; Take 1 tablet (20 mg total) by mouth 2 (two) times daily.  Dispense: 30 tablet; Refill: 1 - DG Abd 1 View; Future  2. Abdominal cramping Only immediately prior to BM, no abd ttp today Trial of bentyl   - CBC with Differential/Platelet - COMPLETE METABOLIC PANEL WITH GFR - Lipase - POCT urinalysis dipstick - dicyclomine (BENTYL) 20 MG tablet; Take 1 tablet (20 mg total) by mouth 3  (three) times daily as needed for spasms (abd cramping).  Dispense: 20 tablet; Refill: 1 - famotidine (PEPCID) 20 MG tablet; Take 1 tablet (20 mg total) by mouth 2 (two) times daily.  Dispense: 30 tablet; Refill: 1 - DG Abd 1 View; Future      Delsa Grana, PA-C 08/15/22 1:58 PM

## 2022-08-16 LAB — CBC WITH DIFFERENTIAL/PLATELET
Absolute Monocytes: 506 cells/uL (ref 200–950)
Basophils Absolute: 32 cells/uL (ref 0–200)
Basophils Relative: 0.5 %
Eosinophils Absolute: 90 cells/uL (ref 15–500)
Eosinophils Relative: 1.4 %
HCT: 40.4 % (ref 35.0–45.0)
Hemoglobin: 13 g/dL (ref 11.7–15.5)
Lymphs Abs: 2733 cells/uL (ref 850–3900)
MCH: 25.8 pg — ABNORMAL LOW (ref 27.0–33.0)
MCHC: 32.2 g/dL (ref 32.0–36.0)
MCV: 80.3 fL (ref 80.0–100.0)
MPV: 11.4 fL (ref 7.5–12.5)
Monocytes Relative: 7.9 %
Neutro Abs: 3040 cells/uL (ref 1500–7800)
Neutrophils Relative %: 47.5 %
Platelets: 273 10*3/uL (ref 140–400)
RBC: 5.03 10*6/uL (ref 3.80–5.10)
RDW: 14.1 % (ref 11.0–15.0)
Total Lymphocyte: 42.7 %
WBC: 6.4 10*3/uL (ref 3.8–10.8)

## 2022-08-16 LAB — COMPLETE METABOLIC PANEL WITH GFR
AG Ratio: 0.9 (calc) — ABNORMAL LOW (ref 1.0–2.5)
ALT: 48 U/L — ABNORMAL HIGH (ref 6–29)
AST: 31 U/L (ref 10–35)
Albumin: 3.7 g/dL (ref 3.6–5.1)
Alkaline phosphatase (APISO): 135 U/L (ref 37–153)
BUN: 8 mg/dL (ref 7–25)
CO2: 26 mmol/L (ref 20–32)
Calcium: 8.9 mg/dL (ref 8.6–10.4)
Chloride: 104 mmol/L (ref 98–110)
Creat: 0.75 mg/dL (ref 0.50–1.03)
Globulin: 4 g/dL (calc) — ABNORMAL HIGH (ref 1.9–3.7)
Glucose, Bld: 101 mg/dL — ABNORMAL HIGH (ref 65–99)
Potassium: 3.9 mmol/L (ref 3.5–5.3)
Sodium: 138 mmol/L (ref 135–146)
Total Bilirubin: 0.4 mg/dL (ref 0.2–1.2)
Total Protein: 7.7 g/dL (ref 6.1–8.1)
eGFR: 96 mL/min/{1.73_m2} (ref >=60)

## 2022-08-16 LAB — LIPASE: Lipase: 25 U/L (ref 7–60)

## 2022-08-22 ENCOUNTER — Other Ambulatory Visit: Payer: Self-pay | Admitting: Family Medicine

## 2022-08-22 DIAGNOSIS — R109 Unspecified abdominal pain: Secondary | ICD-10-CM

## 2022-08-22 DIAGNOSIS — R197 Diarrhea, unspecified: Secondary | ICD-10-CM

## 2022-09-25 ENCOUNTER — Encounter: Payer: Self-pay | Admitting: Emergency Medicine

## 2022-09-25 ENCOUNTER — Ambulatory Visit
Admission: EM | Admit: 2022-09-25 | Discharge: 2022-09-25 | Disposition: A | Discharge status: Home / Self Care | Payer: BC Managed Care – PPO | Attending: Urgent Care | Admitting: Urgent Care

## 2022-09-25 ENCOUNTER — Other Ambulatory Visit: Payer: Self-pay

## 2022-09-25 DIAGNOSIS — J069 Acute upper respiratory infection, unspecified: Secondary | ICD-10-CM

## 2022-09-25 MED ORDER — BENZONATATE 100 MG PO CAPS: ORAL_CAPSULE | ORAL | 0 refills | Status: DC

## 2022-09-25 NOTE — Discharge Instructions (Addendum)
Follow up here or with your primary care provider if your symptoms are worsening or not improving.     

## 2022-09-25 NOTE — ED Provider Notes (Addendum)
Renaldo Fiddler    CSN: [id number] Arrival date & time: 09/25/22  1847      History   Chief Complaint Chief Complaint  Patient presents with   Cough    HPI Alisha Kim is a 52 y.o. female.    Cough   Presents to urgent care with symptoms of cough x 3 days.  She states she feels pressure on her ears and nose.  Cough is productive of yellow sputum.  Evaded blood pressure today.  Treated for hypertension with amlodipine 5 mg daily.  PMH includes DM 2, asthma.  Past Medical History:  Diagnosis Date   Adenomyosis 09/2015   Allergy    Anemia    history of   Anxiety    controlled;    Asthma    Depression with anxiety    Diabetes mellitus without complication    was told she was pre-diabetic   Fibroids 08/14/2016   [place] March 2018   History of Helicobacter pylori infection 08/08/2016   2004   History of kidney stones    when in her 20's   Hydrosalpinx 08/14/2016   right   Hypertension    controlled with medication;    Mixed hyperlipidemia 07/09/2020   Pre-diabetes     Patient Active Problem List   Diagnosis Date Noted   Type 2 diabetes mellitus without complication, without long-term current use of insulin 05/12/2022   Type 2 diabetes mellitus without complication, without long-term current use of insulin 05/08/2022   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 12/05/2021   Vitamin D deficiency 05/22/2021   Mixed hyperlipidemia 07/09/2020   Current moderate episode of major depressive disorder 02/17/2020   Grief reaction 02/17/2020   Morbid obesity 05/31/2018   Allergic rhinitis 03/01/2018   Cervical stenosis of spinal canal 08/28/2017   S/P vaginal hysterectomy 09/29/2016   History of Helicobacter pylori infection 08/08/2016   Microcytosis 08/01/2016   Heel spur, left 04/07/2016   Bilateral foot pain 04/04/2016   Anxiety 04/04/2016   Hypertension, goal below 140/90 07/24/2015   Bilateral hip pain 11/17/2014   Atopic dermatitis 11/17/2014    Mild persistent asthma with allergic rhinitis without complication 11/17/2014    Past Surgical History:  Procedure Laterality Date   ABDOMINAL HYSTERECTOMY     BREAST BIOPSY Right 12/02/2021   Stereo Bx, Coil Clip, path pending   BREAST BIOPSY Right 12/02/2021   Stereo Bx, X clip, path pending   BREAST BIOPSY Right 07/01/2022   stereo bx, calcs, "X" clip-path pending   BREAST BIOPSY Right 07/01/2022   MM RT BREAST BX W LOC DEV 1ST LESION IMAGE BX SPEC STEREO GUIDE 07/01/2022 [facility]   BREAST LUMPECTOMY WITH RADIO FREQUENCY LOCALIZER Right 12/25/2021   Procedure: BREAST LUMPECTOMY WITH RADIO FREQUENCY LOCALIZER;  Surgeon: Campbell Lerner, MD;  Location: [facility];  Service: General;  Laterality: Right;   BREAST LUMPECTOMY WITH RADIOFREQUENCY TAG IDENTIFICATION Right 12/19/2021   x 2 areas, bracketing   COLONOSCOPY WITH PROPOFOL N/A 05/17/2018   Procedure: COLONOSCOPY WITH PROPOFOL;  Surgeon: Toney Reil, MD;  Location: [facility];  Service: Gastroenterology;  Laterality: N/A;   DENTAL SURGERY     NO PAST SURGERIES     POLYPECTOMY     TONSILLECTOMY     TUBAL LIGATION     VAGINAL HYSTERECTOMY Bilateral 09/29/2016   Procedure: HYSTERECTOMY VAGINAL WITH BILATERAL SALPINGECTOMY;  Surgeon: Hildred Laser, MD;  Location: [facility];  Service: Gynecology;  Laterality: Bilateral;    OB History  Gravida  1   Para  1   Term  1   Preterm      AB      Living  1      SAB      IAB      Ectopic      Multiple      Live Births  1            Home Medications    Prior to Admission medications   Medication Sig Start Date End Date Taking? Authorizing Provider  albuterol (PROAIR HFA) 108 (90 Base) MCG/ACT inhaler Inhale 1-2 puffs into the lungs every 4 (four) hours as needed for wheezing or shortness of breath. 02/20/21   Caro Laroche, DO  amLODipine (NORVASC) 5 MG tablet TAKE 1 TABLET BY MOUTH EVERY DAY IN THE EVENING 05/06/22   Danelle Berry, PA-C   dicyclomine (BENTYL) 20 MG tablet Take 1 tablet (20 mg total) by mouth 3 (three) times daily as needed for spasms (abd cramping). Patient not taking: Reported on 09/25/2022 08/15/22   Danelle Berry, PA-C  famotidine (PEPCID) 20 MG tablet TAKE 1 TABLET BY MOUTH TWICE A DAY Patient not taking: Reported on 09/25/2022 08/22/22   Danelle Berry, PA-C  hydrOXYzine (ATARAX/VISTARIL) 10 MG tablet Take 1-2 tablets (10-20 mg total) by mouth every 6 (six) hours as needed for itching. 07/09/20   Danelle Berry, PA-C  levocetirizine (XYZAL) 5 MG tablet TAKE 1 TABLET BY MOUTH EVERY DAY IN THE EVENING 05/05/22   Danelle Berry, PA-C  metFORMIN (GLUCOPHAGE-XR) 750 MG 24 hr tablet Take 1 tablet (750)  by mouth once daily with food/meal 05/08/22   Danelle Berry, PA-C  rosuvastatin (CRESTOR) 5 MG tablet Take 1 tablet (5 mg total) by mouth daily. Patient not taking: Reported on 09/25/2022 05/08/22   Danelle Berry, PA-C  umeclidinium-vilanterol (ANORO ELLIPTA) 62.5-25 MCG/ACT AEPB Inhale 1 puff into the lungs daily at 6 (six) AM. Patient taking differently: Inhale 1 puff into the lungs as needed. 08/12/21   Danelle Berry, PA-C    Family History Family History  Problem Relation Age of Onset   Breast cancer Mother 6   Alcohol abuse Mother    Arthritis Mother    Asthma Mother    Depression Mother    Drug abuse Mother    Hypertension Mother    Mental illness Mother    Cancer Mother        breast and lung   Diabetes Mother    Alcohol abuse Father    Drug abuse Father    Cancer Father        lung   Hypertension Brother    Cancer Maternal Grandmother        cervical   Heart disease Maternal Grandmother    Hypertension Maternal Grandmother    Hearing loss Maternal Aunt    Hypertension Maternal Aunt    Stroke Maternal Aunt    Alzheimer's disease Maternal Aunt    Breast cancer Maternal Aunt        mat great aunt    Social History Social History   Tobacco Use   Smoking status: Never    Passive exposure: Never    Smokeless tobacco: Never  Vaping Use   Vaping Use: Never used  Substance Use Topics   Alcohol use: Not Currently    Alcohol/week: 0.0 standard drinks of alcohol   Drug use: No     Allergies   Meloxicam, Zithromax [azithromycin], Pseudoephedrine-guaifenesin, Shellfish allergy, and Shellfish-derived products  Review of Systems Review of Systems  Respiratory:  Positive for cough.      Physical Exam Triage Vital Signs ED Triage Vitals  Enc Vitals Group     BP 09/25/22 1913 (!) 170/100     Pulse Rate 09/25/22 1913 96     Resp 09/25/22 1913 (!) 22     Temp 09/25/22 1913 98.3 F (36.8 C)     Temp src --      SpO2 09/25/22 1913 100 %     Weight --      Height --      Head Circumference --      Peak Flow --      Pain Score 09/25/22 1909 5     Pain Loc --      Pain Edu? --      Excl. in GC? --    No data found.  Updated Vital Signs BP (!) 170/100 (BP Location: Left Arm) Comment (BP Location): regular cuff on forearm  Pulse 96   Temp 98.3 F (36.8 C)   Resp (!) 22   LMP 09/29/2016 Comment: partial  SpO2 100%   Visual Acuity Right Eye Distance:   Left Eye Distance:   Bilateral Distance:    Right Eye Near:   Left Eye Near:    Bilateral Near:     Physical Exam Vitals reviewed.  Constitutional:      Appearance: She is ill-appearing.  HENT:     Nose: Congestion and rhinorrhea present.     Mouth/Throat:     Pharynx: Posterior oropharyngeal erythema present. No oropharyngeal exudate.  Cardiovascular:     Rate and Rhythm: Normal rate and regular rhythm.     Pulses: Normal pulses.  Pulmonary:     Effort: Pulmonary effort is normal.     Breath sounds: Rhonchi present. No wheezing.  Skin:    General: Skin is warm and dry.  Neurological:     General: No focal deficit present.     Mental Status: She is alert and oriented to person, place, and time.  Psychiatric:        Mood and Affect: Mood normal.        Behavior: Behavior normal.      UC Treatments /  Results  Labs (all labs ordered are listed, but only abnormal results are displayed) Labs Reviewed - No data to display  EKG   Radiology No results found.  Procedures Procedures (including critical care time)  Medications Ordered in UC Medications - No data to display  Initial Impression / Assessment and Plan / UC Course  I have reviewed the triage vital signs and the nursing notes.  Pertinent labs & imaging results that were available during my care of the patient were reviewed by me and considered in my medical decision making (see chart for details).   Alisha Kim is a 52 y.o. female presenting with URI symptoms. Patient is afebrile without recent antipyretics, satting well on room air. Overall is ill appearing though non-toxic, well hydrated, without respiratory distress. Pulmonary exam is unremarkable.  Lungs CTAB without wheezing, rhonchi, rales.  There is pharyngeal erythema.  No peritonsillar exudates.  There is nasal congestion with rhinorrhea.  Patient's symptoms are consistent with an acute viral process.  Given her history of asthma with exacerbation during viral illnesses, considered prescription azithromycin because of the risk of poor outcome, however she has intolerance.  No wheezing today so no need to prescribe corticosteroid.  Reminding her to use her  inhaler if she has exacerbation.  Return to clinic for shortness of breath or go to the emergency room.  Recommended supportive care with use of OTC medication for symptom control including medication specifically for high blood pressure such as Coricidin.  Counseled patient on potential for adverse effects with medications prescribed/recommended today, ER and return-to-clinic precautions discussed, patient verbalized understanding and agreement with care plan.   Final Clinical Impressions(s) / UC Diagnoses   Final diagnoses:  None   Discharge Instructions   None    ED Prescriptions   None    PDMP not  reviewed this encounter.   Charma Igo, FNP 09/25/22 1925    Charma Igo, FNP 09/25/22 1927

## 2022-09-25 NOTE — ED Triage Notes (Addendum)
Feels pressure on ears and nose.  Cough for 3 days.  Coughing up yellow phlegm/. Reports fever 99 earlier today.    Has had claritin earlier today

## 2022-10-30 ENCOUNTER — Other Ambulatory Visit: Payer: Self-pay | Admitting: Family Medicine

## 2022-10-30 DIAGNOSIS — E119 Type 2 diabetes mellitus without complications: Secondary | ICD-10-CM

## 2022-10-30 NOTE — Telephone Encounter (Signed)
Pt has an appt on 11/10/22

## 2022-10-30 NOTE — Telephone Encounter (Signed)
Pt needs f/u appt

## 2022-11-05 ENCOUNTER — Ambulatory Visit: Payer: BC Managed Care – PPO | Admitting: Family Medicine

## 2022-11-10 ENCOUNTER — Ambulatory Visit (INDEPENDENT_AMBULATORY_CARE_PROVIDER_SITE_OTHER): Payer: BC Managed Care – PPO | Admitting: Family Medicine

## 2022-11-10 VITALS — BP 108/70 | HR 76 | Temp 97.9°F | Resp 16 | Ht 66.0 in | Wt 257.4 lb

## 2022-11-10 DIAGNOSIS — E782 Mixed hyperlipidemia: Secondary | ICD-10-CM

## 2022-11-10 DIAGNOSIS — F419 Anxiety disorder, unspecified: Secondary | ICD-10-CM

## 2022-11-10 DIAGNOSIS — Z7984 Long term (current) use of oral hypoglycemic drugs: Secondary | ICD-10-CM

## 2022-11-10 DIAGNOSIS — F410 Panic disorder [episodic paroxysmal anxiety] without agoraphobia: Secondary | ICD-10-CM

## 2022-11-10 DIAGNOSIS — J4530 Mild persistent asthma, uncomplicated: Secondary | ICD-10-CM

## 2022-11-10 DIAGNOSIS — E119 Type 2 diabetes mellitus without complications: Secondary | ICD-10-CM

## 2022-11-10 DIAGNOSIS — F331 Major depressive disorder, recurrent, moderate: Secondary | ICD-10-CM

## 2022-11-10 DIAGNOSIS — F4321 Adjustment disorder with depressed mood: Secondary | ICD-10-CM

## 2022-11-10 DIAGNOSIS — E559 Vitamin D deficiency, unspecified: Secondary | ICD-10-CM

## 2022-11-10 DIAGNOSIS — Z5181 Encounter for therapeutic drug level monitoring: Secondary | ICD-10-CM

## 2022-11-10 DIAGNOSIS — Z1231 Encounter for screening mammogram for malignant neoplasm of breast: Secondary | ICD-10-CM

## 2022-11-10 DIAGNOSIS — I10 Essential (primary) hypertension: Secondary | ICD-10-CM

## 2022-11-10 DIAGNOSIS — R7989 Other specified abnormal findings of blood chemistry: Secondary | ICD-10-CM

## 2022-11-10 NOTE — Assessment & Plan Note (Signed)
Last A1C had increased to 6.8, she did agree to increase metformin dose from XR 500 mg to 750 mg once daily, she is tolerating this Here for f/up labs DM foot exam and UACR UTD Not on statin - discussed again risk and benefits plus medical recommendation with T2DM

## 2022-11-10 NOTE — Progress Notes (Signed)
Name: Alisha Kim   MRN: [id number]    DOB: [birth year]   Date:11/10/2022       Progress Note  Chief Complaint  Patient presents with   Follow-up   Hypertension   Hyperlipidemia   Diabetes   Subjective:   Alisha Kim is a 52 y.o. female, presents to clinic for routine f/up on chronic conditions  Hypertension:  Currently managed on amlodipine 5 mg Pt reports good med compliance and denies any SE.   Blood pressure today is well controlled BP Readings from Last 3 Encounters:  11/10/22 108/70  09/25/22 (!) 170/100  08/15/22 132/76     HLD Not currently on medications - but they are indicated with T2DM Lab Results  Component Value Date   CHOL 223 (H) 05/05/2022   HDL 46 (L) 05/05/2022   LDLCALC 160 (H) 05/05/2022   TRIG 71 05/05/2022   CHOLHDL 4.8 05/05/2022  The 10-year ASCVD risk score (Arnett DK, et al., 2019) is: 6.2%   Values used to calculate the score:     Age: 27 years     Sex: Female     Is Non-Hispanic African American: Yes     Diabetic: Yes     Tobacco smoker: No     Systolic Blood Pressure: 108 mmHg     Is BP treated: Yes     HDL Cholesterol: 46 mg/dL     Total Cholesterol: 223 mg/dL  DM: On metformin 750 XR daily - dose increased no SE or concerns Recent pertinent labs: Lab Results  Component Value Date   HGBA1C 6.8 (H) 05/05/2022   HGBA1C 6.3 (H) 12/19/2021   HGBA1C 6.4 (H) 05/22/2021   Lab Results  Component Value Date   MICROALBUR 0.4 05/05/2022   LDLCALC 160 (H) 05/05/2022   CREATININE 0.75 08/15/2022   Standard of care and health maintenance: Urine Microalbumin:  UTD Foot exam:  UTD DM eye exam:  DUE ACEI/ARB:  no Statin:  no   Asthma/allergies No change - On anoro ellipta, albuterol prn, on xyzal (allegra, claritin ineffective and zyrtec causes sleepiness, tried sample of ryaltris) Reports still well controlled   MDD/anxiety: Previously on lexapro- was cross-tapered to start zoloft, she was on it April to August and she  stopped it in August due to weight gain, uses hydroxyzine PRN She is having worse sx with increased stress with work, the death of her mother, having panic attacks, not currently on meds    11/10/2022    3:01 PM 08/15/2022    1:41 PM 06/13/2022    3:06 PM  Depression screen PHQ 2/9  Decreased Interest 3 0 0  Down, Depressed, Hopeless 3 0 0  PHQ - 2 Score 6 0 0  Altered sleeping 3 0 0  Tired, decreased energy 3 0 0  Change in appetite 3 0 0  Feeling bad or failure about yourself  0 0 0  Trouble concentrating 3 0 0  Moving slowly or fidgety/restless 0 0 0  Suicidal thoughts 0 0 0  PHQ-9 Score 18 0 0  Difficult doing work/chores Extremely dIfficult Not difficult at all Not difficult at all      11/10/2022    3:16 PM 05/05/2022    3:22 PM 11/01/2021   12:53 PM 05/22/2021    2:15 PM  GAD 7 : Generalized Anxiety Score  Nervous, Anxious, on Edge 3 0 0 0  Control/stop worrying 3 0 0 0  Worry too much - different things 3 0 0  0  Trouble relaxing 3 0 0 0  Restless 3 0 0 0  Easily annoyed or irritable 3 0 0 0  Afraid - awful might happen 3 0 0 0  Total GAD 7 Score 21 0 0 0  Anxiety Difficulty Extremely difficult Not difficult at all Not difficult at all Not difficult at all   Scores extremely high, with HA attacks  Panic attacks waves of Ha, feels like things crawling on her  She states she stopped lexapro because she believed it caused her elevated LFTs  She was on lexapro from 2016 to 2023 Zoloft 2023 for part of the year, gained weight LFTs normal until 2021 and 2022   Last labs had elevated LFT's hx of similar - mild elevation and mild alk phos elevation LFTs elevated since 2021 and worsened in 2022  Lab Results  Component Value Date   AST 31 08/15/2022   AST 36 (H) 05/05/2022   AST 41 12/19/2021   AST 36 (H) 05/22/2021   AST 20 07/09/2020   AST 24 03/19/2020   Lab Results  Component Value Date   ALT 48 (H) 08/15/2022   ALT 62 (H) 05/05/2022   ALT 71 (H) 12/19/2021   ALT  61 (H) 05/22/2021   ALT 27 07/09/2020   ALT 35 (H) 03/19/2020   Lab Results  Component Value Date   CHOL 223 (H) 05/05/2022   CHOL 214 (H) 02/20/2021   CHOL 206 (H) 07/09/2020   CHOL 225 (H) 03/19/2020   CHOL 180 03/01/2018   CHOL 145 08/28/2017   CHOL 168 04/04/2016   Lab Results  Component Value Date   LDLCALC 160 (H) 05/05/2022   LDLCALC 156 (H) 02/20/2021   LDLCALC 149 (H) 07/09/2020   LDLCALC 169 (H) 03/19/2020   LDLCALC 123 (H) 03/01/2018   LDLCALC 94 08/28/2017   LDLCALC 125 04/04/2016   Obesity: So significant weight changes over the past several years 255-270's But weight loss this year      Current Outpatient Medications:    albuterol (PROAIR HFA) 108 (90 Base) MCG/ACT inhaler, Inhale 1-2 puffs into the lungs every 4 (four) hours as needed for wheezing or shortness of breath., Disp: 1 each, Rfl: 5   amLODipine (NORVASC) 5 MG tablet, TAKE 1 TABLET BY MOUTH EVERY DAY IN THE EVENING, Disp: 90 tablet, Rfl: 1   hydrOXYzine (ATARAX/VISTARIL) 10 MG tablet, Take 1-2 tablets (10-20 mg total) by mouth every 6 (six) hours as needed for itching., Disp: 120 tablet, Rfl: 2   metFORMIN (GLUCOPHAGE-XR) 750 MG 24 hr tablet, TAKE 1 TABLET (750) BY MOUTH ONCE DAILY WITH FOOD/MEAL, Disp: 30 tablet, Rfl: 0   umeclidinium-vilanterol (ANORO ELLIPTA) 62.5-25 MCG/ACT AEPB, Inhale 1 puff into the lungs daily at 6 (six) AM. (Patient taking differently: Inhale 1 puff into the lungs as needed.), Disp: 60 each, Rfl: 5   benzonatate (TESSALON) 100 MG capsule, Take 1-2 tablets 3 times a day as needed for cough, Disp: 30 capsule, Rfl: 0   dicyclomine (BENTYL) 20 MG tablet, Take 1 tablet (20 mg total) by mouth 3 (three) times daily as needed for spasms (abd cramping). (Patient not taking: Reported on 09/25/2022), Disp: 20 tablet, Rfl: 1   famotidine (PEPCID) 20 MG tablet, TAKE 1 TABLET BY MOUTH TWICE A DAY (Patient not taking: Reported on 09/25/2022), Disp: 180 tablet, Rfl: 1   levocetirizine (XYZAL)  5 MG tablet, TAKE 1 TABLET BY MOUTH EVERY DAY IN THE EVENING, Disp: 90 tablet, Rfl: 0  rosuvastatin (CRESTOR) 5 MG tablet, Take 1 tablet (5 mg total) by mouth daily. 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 12/05/2021   Vitamin D deficiency 05/22/2021   Mixed hyperlipidemia 07/09/2020   Current moderate episode of major depressive disorder (HCC) 02/17/2020   Grief reaction 02/17/2020   Morbid obesity (HCC) 05/31/2018   Allergic rhinitis 03/01/2018   Cervical stenosis of spinal canal 08/28/2017   S/P vaginal hysterectomy 09/29/2016   History of Helicobacter pylori infection 08/08/2016   Microcytosis 08/01/2016   Heel spur, left 04/07/2016   Bilateral foot pain 04/04/2016   Anxiety 04/04/2016   Hypertension, goal below 140/90 07/24/2015   Bilateral hip pain 11/17/2014   Atopic dermatitis 11/17/2014   Mild persistent asthma with allergic rhinitis without complication 11/17/2014    Past Surgical History:  Procedure Laterality Date   ABDOMINAL HYSTERECTOMY     BREAST BIOPSY Right 12/02/2021   Stereo Bx, Coil Clip, path pending   BREAST BIOPSY Right 12/02/2021   Stereo Bx, X clip, path pending   BREAST BIOPSY Right 07/01/2022   stereo bx, calcs, "X" clip-path pending   BREAST BIOPSY Right 07/01/2022   MM RT BREAST BX W LOC DEV 1ST LESION IMAGE BX SPEC STEREO GUIDE 07/01/2022 [facility]   BREAST LUMPECTOMY WITH RADIO FREQUENCY LOCALIZER Right 12/25/2021   Procedure: BREAST LUMPECTOMY WITH RADIO FREQUENCY LOCALIZER;  Surgeon: Campbell Lerner, MD;  Location: [facility];  Service: General;  Laterality: Right;   BREAST LUMPECTOMY WITH RADIOFREQUENCY TAG IDENTIFICATION Right 12/19/2021   x 2 areas, bracketing   COLONOSCOPY WITH PROPOFOL N/A 05/17/2018    Procedure: COLONOSCOPY WITH PROPOFOL;  Surgeon: Toney Reil, MD;  Location: [facility];  Service: Gastroenterology;  Laterality: N/A;   DENTAL SURGERY     NO PAST SURGERIES     POLYPECTOMY     TONSILLECTOMY     TUBAL LIGATION     VAGINAL HYSTERECTOMY Bilateral 09/29/2016   Procedure: HYSTERECTOMY VAGINAL WITH BILATERAL SALPINGECTOMY;  Surgeon: Hildred Laser, MD;  Location: [facility];  Service: Gynecology;  Laterality: Bilateral;    Family History  Problem Relation Age of Onset   Breast cancer Mother 23   Alcohol abuse Mother    Arthritis Mother    Asthma Mother    Depression Mother    Drug abuse Mother    Hypertension Mother    Mental illness Mother    Cancer Mother        breast and lung   Diabetes Mother    Alcohol abuse Father    Drug abuse Father    Cancer Father        lung   Hypertension Brother    Cancer Maternal Grandmother        cervical   Heart disease Maternal Grandmother    Hypertension Maternal Grandmother    Hearing loss Maternal Aunt    Hypertension Maternal Aunt    Stroke Maternal Aunt    Alzheimer's disease Maternal Aunt    Breast cancer Maternal Aunt        mat great aunt    Social History   Tobacco Use   Smoking status: Never    Passive exposure: Never   Smokeless tobacco: Never  Vaping Use   Vaping Use: Never used  Substance Use Topics  Alcohol use: Not Currently    Alcohol/week: 0.0 standard drinks of alcohol   Drug use: No     Allergies  Allergen Reactions   Meloxicam Other (See Comments)    Felt horrible, headache, bloating, back pain, SHOB; no rash   Zithromax [Azithromycin] Swelling    Tongue    Pseudoephedrine-Guaifenesin Swelling    Of tongue   Shellfish Allergy Rash   Shellfish-Derived Products Rash    Health Maintenance  Topic Date Due   MAMMOGRAM  11/02/2022   OPHTHALMOLOGY EXAM  10/16/2022   HEMOGLOBIN A1C  11/03/2022   COVID-19 Vaccine (3 - 2023-24 season) 11/26/2022 (Originally 02/07/2022)   Zoster  Vaccines- Shingrix (1 of 2) 02/10/2023 (Originally 04/25/2021)   INFLUENZA VACCINE  01/08/2023   Diabetic kidney evaluation - Urine ACR  05/06/2023   FOOT EXAM  05/06/2023   Diabetic kidney evaluation - eGFR measurement  08/15/2023   Colonoscopy  05/17/2028   Hepatitis C Screening  Completed   HIV Screening  Completed   HPV VACCINES  Aged Out   DTaP/Tdap/Td  Discontinued    Chart Review Today: I personally reviewed active problem list, medication list, allergies, family history, social history, health maintenance, notes from last encounter, lab results, imaging with the patient/caregiver today.   Review of Systems  Constitutional: Negative.   HENT: Negative.    Eyes: Negative.   Respiratory: Negative.    Cardiovascular: Negative.   Gastrointestinal: Negative.   Endocrine: Negative.   Genitourinary: Negative.   Musculoskeletal: Negative.   Skin: Negative.   Allergic/Immunologic: Negative.   Neurological: Negative.   Hematological: Negative.   Psychiatric/Behavioral: Negative.    All other systems reviewed and are negative.    Objective:   Vitals:   11/10/22 1509  BP: 108/70  Pulse: 76  Resp: 16  Temp: 97.9 F (36.6 C)  TempSrc: Oral  SpO2: 98%  Weight: 257 lb 6.4 oz (116.8 kg)  Height: 5\' 6"  (1.676 m)    Body mass index is 41.55 kg/m.  Physical Exam Vitals and nursing note reviewed.  Constitutional:      General: She is not in acute distress.    Appearance: Normal appearance. She is well-developed. She is obese. She is not ill-appearing, toxic-appearing or diaphoretic.  HENT:     Head: Normocephalic and atraumatic.     Nose: Nose normal.  Eyes:     General:        Right eye: No discharge.        Left eye: No discharge.     Conjunctiva/sclera: Conjunctivae normal.  Neck:     Trachea: No tracheal deviation.  Cardiovascular:     Rate and Rhythm: Normal rate and regular rhythm.  Pulmonary:     Effort: Pulmonary effort is normal. No respiratory  distress.     Breath sounds: No stridor.  Musculoskeletal:        General: Normal range of motion.  Skin:    General: Skin is warm and dry.     Findings: No rash.  Neurological:     Mental Status: She is alert.     Motor: No abnormal muscle tone.     Coordination: Coordination normal.  Psychiatric:        Mood and Affect: Mood is anxious and depressed. Affect is tearful.        Behavior: Behavior normal. Behavior is cooperative.         Assessment & Plan:        ICD-10-CM   1. Type 2  diabetes mellitus without complication, without long-term current use of insulin (HCC)  E11.9 Hemoglobin A1c    COMPLETE METABOLIC PANEL WITH GFR    Ambulatory referral to Ophthalmology   has been controlled, but A1C increasing, metformin dose increased and pt is tolerating and shes lost weight, recheck a1c    2. Mixed hyperlipidemia  E78.2 COMPLETE METABOLIC PANEL WITH GFR   gradual increase with very high LDL and total cholesterol, she did not want to recheck it today    3. Primary hypertension  I10 COMPLETE METABOLIC PANEL WITH GFR   BP well controlled today on norvasc 5 mg    4. Mild persistent asthma with allergic rhinitis without complication  J45.30    stable    5. Moderate episode of recurrent major depressive disorder (HCC)  F33.1 Ambulatory referral to Psychiatry   PHQ9 score very high, encouraged pt to restart SSRI - reviewed past meds recommended lexapro again, she declined, referred to psychiatry    6. Anxiety  F41.9 Ambulatory referral to Psychiatry   currently very severe sx, she is hesitant to restart meds, working with a therapist but sx are continuing to worsen, mothers death, work stress    7. Encounter for medication monitoring  Z51.81 Hemoglobin A1c    COMPLETE METABOLIC PANEL WITH GFR    VITAMIN D 25 Hydroxy (Vit-D Deficiency, Fractures)    8. Encounter for screening mammogram for malignant neoplasm of breast  Z12.31    due - however with multiple scans and biopsy  unclear which way mammo needs to be ordered - will reach out to managing specialists    9. Vitamin D deficiency  E55.9 COMPLETE METABOLIC PANEL WITH GFR    VITAMIN D 25 Hydroxy (Vit-D Deficiency, Fractures)   prior low vit d and abnormal alk phos, recheck    10. Panic attacks  F41.0 Ambulatory referral to Psychiatry   encouraged her to est with psychiatry for more indepth eval and tx with medications - she is hesitant to do daily meds but needs them    11. Grief reaction  F43.21 Ambulatory referral to Psychiatry   supportive listening, offered resources    12. Elevated LFTs  R79.89 COMPLETE METABOLIC PANEL WITH GFR   suspect from increased cholesterol and DM         No follow-ups on file.   Danelle Berry, PA-C 11/10/22 4:20 PM

## 2022-11-11 ENCOUNTER — Other Ambulatory Visit: Payer: Self-pay

## 2022-11-11 DIAGNOSIS — E782 Mixed hyperlipidemia: Secondary | ICD-10-CM

## 2022-11-11 DIAGNOSIS — N6081 Other benign mammary dysplasias of right breast: Secondary | ICD-10-CM

## 2022-11-11 LAB — COMPLETE METABOLIC PANEL WITH GFR
AG Ratio: 1.2 (calc) (ref 1.0–2.5)
ALT: 42 U/L — ABNORMAL HIGH (ref 6–29)
AST: 29 U/L (ref 10–35)
Albumin: 3.8 g/dL (ref 3.6–5.1)
Alkaline phosphatase (APISO): 136 U/L (ref 37–153)
BUN: 9 mg/dL (ref 7–25)
CO2: 25 mmol/L (ref 20–32)
Calcium: 8.9 mg/dL (ref 8.6–10.4)
Chloride: 105 mmol/L (ref 98–110)
Creat: 0.73 mg/dL (ref 0.50–1.03)
Globulin: 3.3 g/dL (calc) (ref 1.9–3.7)
Glucose, Bld: 120 mg/dL — ABNORMAL HIGH (ref 65–99)
Potassium: 3.7 mmol/L (ref 3.5–5.3)
Sodium: 140 mmol/L (ref 135–146)
Total Bilirubin: 0.4 mg/dL (ref 0.2–1.2)
Total Protein: 7.1 g/dL (ref 6.1–8.1)
eGFR: 100 mL/min/{1.73_m2} (ref >=60)

## 2022-11-11 LAB — VITAMIN D 25 HYDROXY (VIT D DEFICIENCY, FRACTURES): Vit D, 25-Hydroxy: 26 ng/mL — ABNORMAL LOW (ref 30–100)

## 2022-11-11 LAB — HEMOGLOBIN A1C
Hgb A1c MFr Bld: 6.4 % of total Hgb — ABNORMAL HIGH (ref <5.7)
Mean Plasma Glucose: 137 mg/dL
eAG (mmol/L): 7.6 mmol/L

## 2022-11-11 MED ORDER — ROSUVASTATIN CALCIUM 5 MG PO TABS
5.0000 mg | ORAL_TABLET | Freq: Every day | ORAL | 3 refills | Status: DC
Start: 2022-11-11 — End: 2023-02-17

## 2022-11-11 NOTE — Addendum Note (Signed)
Addended by: Danelle Berry on: 11/11/2022 01:00 PM   Modules accepted: Orders

## 2022-11-16 ENCOUNTER — Ambulatory Visit
Admission: EM | Admit: 2022-11-16 | Discharge: 2022-11-16 | Disposition: A | Discharge status: Home / Self Care | Payer: BC Managed Care – PPO | Attending: Urgent Care | Admitting: Urgent Care

## 2022-11-16 ENCOUNTER — Encounter: Payer: Self-pay | Admitting: Emergency Medicine

## 2022-11-16 DIAGNOSIS — W57XXXA Bitten or stung by nonvenomous insect and other nonvenomous arthropods, initial encounter: Secondary | ICD-10-CM

## 2022-11-16 DIAGNOSIS — S20469A Insect bite (nonvenomous) of unspecified back wall of thorax, initial encounter: Secondary | ICD-10-CM

## 2022-11-16 MED ORDER — DOXYCYCLINE HYCLATE 100 MG PO CAPS
100.0000 mg | ORAL_CAPSULE | Freq: Two times a day (BID) | ORAL | 0 refills | Status: AC
Start: 2022-11-16 — End: 2022-11-23

## 2022-11-16 NOTE — ED Triage Notes (Signed)
Pt removed 3 ticks from her back appx 3 weeks ago. She now has a rash and its itchy on her back.

## 2022-11-16 NOTE — Discharge Instructions (Signed)
Follow-up here or with your primary care provider if your symptoms do not resolve, worsen, or new symptoms develop.

## 2022-11-16 NOTE — ED Provider Notes (Signed)
[facility] Alisha Kim    CSN: [id number] Arrival date & time: 11/16/22  1102      History   Chief Complaint Chief Complaint  Patient presents with   Insect Bite    HPI Alisha Kim is a 52 y.o. female.   HPI  Patient with 3 weeks history of tick bite itchy rash on her back.  She states she had a tick bite on her leg and reports that the wound has "dried up".  Also reports tick bites on her back and states that these continue to be very itchy though not painful.  Her husband informs her that they look red "bruised".    Patient states she obtained a tick bite cream from the pharmacy.  She is unaware of the active ingredient in this medication.  Past Medical History:  Diagnosis Date   Adenomyosis 09/2015   Allergy    Anemia    history of   Anxiety    controlled;    Asthma    Depression with anxiety    Diabetes mellitus without complication [facility])    was told she was pre-diabetic   Fibroids 08/14/2016   [place] March 2018   History of Helicobacter pylori infection 08/08/2016   2004   History of kidney stones    when in her 20's   Hydrosalpinx 08/14/2016   right   Hypertension    controlled with medication;    Mixed hyperlipidemia 07/09/2020   Pre-diabetes     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 12/05/2021   Vitamin D deficiency 05/22/2021   Mixed hyperlipidemia 07/09/2020   Current moderate episode of major depressive disorder (HCC) 02/17/2020   Grief reaction 02/17/2020   Morbid obesity (HCC) 05/31/2018   Allergic rhinitis 03/01/2018   Cervical stenosis of spinal canal 08/28/2017   S/P vaginal hysterectomy 09/29/2016   History of Helicobacter pylori infection 08/08/2016   Microcytosis 08/01/2016   Heel spur, left 04/07/2016   Bilateral foot pain 04/04/2016   Anxiety 04/04/2016    Hypertension, goal below 140/90 07/24/2015   Bilateral hip pain 11/17/2014   Atopic dermatitis 11/17/2014   Mild persistent asthma with allergic rhinitis without complication 11/17/2014    Past Surgical History:  Procedure Laterality Date   ABDOMINAL HYSTERECTOMY     BREAST BIOPSY Right 12/02/2021   Stereo Bx, Coil Clip, path pending   BREAST BIOPSY Right 12/02/2021   Stereo Bx, X clip, path pending   BREAST BIOPSY Right 07/01/2022   stereo bx, calcs, "X" clip-path pending   BREAST BIOPSY Right 07/01/2022   MM RT BREAST BX W LOC DEV 1ST LESION IMAGE BX SPEC STEREO GUIDE 07/01/2022 [facility]   BREAST LUMPECTOMY WITH RADIO FREQUENCY LOCALIZER Right 12/25/2021   Procedure: BREAST LUMPECTOMY WITH RADIO FREQUENCY LOCALIZER;  Surgeon: Campbell Lerner, MD;  Location: [facility];  Service: General;  Laterality: Right;   BREAST LUMPECTOMY WITH RADIOFREQUENCY TAG IDENTIFICATION Right 12/19/2021   x 2 areas, bracketing   COLONOSCOPY WITH PROPOFOL N/A 05/17/2018   Procedure: COLONOSCOPY WITH PROPOFOL;  Surgeon: Toney Reil, MD;  Location: [facility];  Service: Gastroenterology;  Laterality: N/A;   DENTAL SURGERY     NO PAST SURGERIES     POLYPECTOMY     TONSILLECTOMY     TUBAL LIGATION     VAGINAL HYSTERECTOMY Bilateral 09/29/2016   Procedure:  HYSTERECTOMY VAGINAL WITH BILATERAL SALPINGECTOMY;  Surgeon: Hildred Laser, MD;  Location: [facility];  Service: Gynecology;  Laterality: Bilateral;    OB History     Gravida  1   Para  1   Term  1   Preterm      AB      Living  1      SAB      IAB      Ectopic      Multiple      Live Births  1            Home Medications    Prior to Admission medications   Medication Sig Start Date End Date Taking? Authorizing Provider  albuterol (PROAIR HFA) 108 (90 Base) MCG/ACT inhaler Inhale 1-2 puffs into the lungs every 4 (four) hours as needed for wheezing or shortness of breath. 02/20/21   Caro Laroche, DO   amLODipine (NORVASC) 5 MG tablet TAKE 1 TABLET BY MOUTH EVERY DAY IN THE EVENING 05/06/22   Danelle Berry, PA-C  hydrOXYzine (ATARAX/VISTARIL) 10 MG tablet Take 1-2 tablets (10-20 mg total) by mouth every 6 (six) hours as needed for itching. 07/09/20   Danelle Berry, PA-C  metFORMIN (GLUCOPHAGE-XR) 750 MG 24 hr tablet TAKE 1 TABLET (750) BY MOUTH ONCE DAILY WITH FOOD/MEAL 10/30/22   Danelle Berry, PA-C  rosuvastatin (CRESTOR) 5 MG tablet Take 1 tablet (5 mg total) by mouth daily. 11/11/22   Danelle Berry, PA-C  umeclidinium-vilanterol (ANORO ELLIPTA) 62.5-25 MCG/ACT AEPB Inhale 1 puff into the lungs daily at 6 (six) AM. Patient taking differently: Inhale 1 puff into the lungs as needed. 08/12/21   Danelle Berry, PA-C    Family History Family History  Problem Relation Age of Onset   Breast cancer Mother 58   Alcohol abuse Mother    Arthritis Mother    Asthma Mother    Depression Mother    Drug abuse Mother    Hypertension Mother    Mental illness Mother    Cancer Mother        breast and lung   Diabetes Mother    Alcohol abuse Father    Drug abuse Father    Cancer Father        lung   Hypertension Brother    Cancer Maternal Grandmother        cervical   Heart disease Maternal Grandmother    Hypertension Maternal Grandmother    Hearing loss Maternal Aunt    Hypertension Maternal Aunt    Stroke Maternal Aunt    Alzheimer's disease Maternal Aunt    Breast cancer Maternal Aunt        mat great aunt    Social History Social History   Tobacco Use   Smoking status: Never    Passive exposure: Never   Smokeless tobacco: Never  Vaping Use   Vaping Use: Never used  Substance Use Topics   Alcohol use: Not Currently    Alcohol/week: 0.0 standard drinks of alcohol   Drug use: No     Allergies   Meloxicam, Zithromax [azithromycin], Pseudoephedrine-guaifenesin, Shellfish allergy, and Shellfish-derived products   Review of Systems Review of Systems   Physical Exam Triage Vital  Signs ED Triage Vitals  Enc Vitals Group     BP 11/16/22 1133 (!) 138/96     Pulse Rate 11/16/22 1133 78     Resp 11/16/22 1133 18     Temp 11/16/22 1133 97.9 F (36.6 C)  Temp Source 11/16/22 1133 Oral     SpO2 11/16/22 1133 98 %     Weight --      Height --      Head Circumference --      Peak Flow --      Pain Score 11/16/22 1134 0     Pain Loc --      Pain Edu? --      Excl. in GC? --    No data found.  Updated Vital Signs BP (!) 138/96 (BP Location: Left Arm)   Pulse 78   Temp 97.9 F (36.6 C) (Oral)   Resp 18   LMP 09/29/2016 Comment: partial  SpO2 98%   Visual Acuity Right Eye Distance:   Left Eye Distance:   Bilateral Distance:    Right Eye Near:   Left Eye Near:    Bilateral Near:     Physical Exam Vitals reviewed.  Constitutional:      Appearance: Normal appearance.  Musculoskeletal:       Legs:  Skin:    General: Skin is warm.     Findings: Erythema and rash present.       Neurological:     General: No focal deficit present.     Mental Status: She is alert and oriented to person, place, and time.  Psychiatric:        Mood and Affect: Mood normal.        Behavior: Behavior normal.      UC Treatments / Results  Labs (all labs ordered are listed, but only abnormal results are displayed) Labs Reviewed - No data to display  EKG   Radiology No results found.  Procedures Procedures (including critical care time)  Medications Ordered in UC Medications - No data to display  Initial Impression / Assessment and Plan / UC Course  I have reviewed the triage vital signs and the nursing notes.  Pertinent labs & imaging results that were available during my care of the patient were reviewed by me and considered in my medical decision making (see chart for details).   Alisha Kim is a 52 y.o. female presenting with rash. Patient is afebrile without recent antipyretics, satting well on room air. Overall is well appearing, well  hydrated, without respiratory distress.  Multiple wounds from past insect (tick) bites.  Right lower extremity wound is in state of healing, no erythema or edema, no discharge.  Back wounds include 1 erythematous, 2 hyper pigmented, all nontender though firm to the touch.  Wounds are pruritic.  Reviewed relevant chart history.   Tickborne rash versus local skin infection secondary to tick bite.  Given tick etiology, will treat with doxycycline x 7 days.  Patient advised to follow-up with provider if his symptoms do not resolve, worsen, or new symptoms develop.  Encouraged to use Benadryl at nighttime to relieve pruritus, as well as topical HC cream.  Counseled patient on potential for adverse effects with medications prescribed/recommended today, ER and return-to-clinic precautions discussed, patient verbalized understanding and agreement with care plan.  Final Clinical Impressions(s) / UC Diagnoses   Final diagnoses:  None   Discharge Instructions   None    ED Prescriptions   None    PDMP not reviewed this encounter.   Charma Igo, [place] 11/16/22 1152

## 2022-11-18 ENCOUNTER — Other Ambulatory Visit: Payer: Self-pay | Admitting: *Deleted

## 2022-11-21 ENCOUNTER — Telehealth (HOSPITAL_COMMUNITY): Payer: Self-pay | Admitting: Emergency Medicine

## 2022-11-23 ENCOUNTER — Other Ambulatory Visit: Payer: Self-pay | Admitting: Family Medicine

## 2022-11-23 DIAGNOSIS — E119 Type 2 diabetes mellitus without complications: Secondary | ICD-10-CM

## 2022-11-25 ENCOUNTER — Other Ambulatory Visit: Payer: Self-pay | Admitting: Family Medicine

## 2022-11-25 DIAGNOSIS — I10 Essential (primary) hypertension: Secondary | ICD-10-CM

## 2022-11-28 ENCOUNTER — Ambulatory Visit
Admission: RE | Admit: 2022-11-28 | Discharge: 2022-11-28 | Disposition: A | Discharge status: Home / Self Care | Payer: BC Managed Care – PPO | Source: Ambulatory Visit | Attending: Surgery | Admitting: Surgery

## 2022-11-28 DIAGNOSIS — N6081 Other benign mammary dysplasias of right breast: Secondary | ICD-10-CM | POA: Diagnosis not present

## 2022-11-28 DIAGNOSIS — R921 Mammographic calcification found on diagnostic imaging of breast: Secondary | ICD-10-CM | POA: Diagnosis not present

## 2022-12-16 ENCOUNTER — Ambulatory Visit: Payer: BC Managed Care – PPO | Admitting: Surgery

## 2023-02-17 ENCOUNTER — Encounter: Payer: Self-pay | Admitting: Family Medicine

## 2023-02-17 ENCOUNTER — Ambulatory Visit (INDEPENDENT_AMBULATORY_CARE_PROVIDER_SITE_OTHER): Payer: BC Managed Care – PPO | Admitting: Family Medicine

## 2023-02-17 VITALS — BP 132/94 | HR 99 | Temp 97.5°F | Resp 16 | Ht 66.0 in | Wt 261.2 lb

## 2023-02-17 DIAGNOSIS — M791 Myalgia, unspecified site: Secondary | ICD-10-CM

## 2023-02-17 DIAGNOSIS — R7989 Other specified abnormal findings of blood chemistry: Secondary | ICD-10-CM | POA: Diagnosis not present

## 2023-02-17 DIAGNOSIS — B349 Viral infection, unspecified: Secondary | ICD-10-CM

## 2023-02-17 DIAGNOSIS — Z5181 Encounter for therapeutic drug level monitoring: Secondary | ICD-10-CM | POA: Diagnosis not present

## 2023-02-17 DIAGNOSIS — J329 Chronic sinusitis, unspecified: Secondary | ICD-10-CM | POA: Diagnosis not present

## 2023-02-17 DIAGNOSIS — I10 Essential (primary) hypertension: Secondary | ICD-10-CM

## 2023-02-17 NOTE — Progress Notes (Signed)
Patient ID: Alisha Kim, female    DOB: 10-05-1970, 52 y.o.   MRN: [id number]  PCP: Danelle Berry, PA-C  Chief Complaint  Patient presents with   Muscle Pain    Arms and legs    Subjective:   Alisha Kim is a 52 y.o. female, presents to clinic with CC of the following:  HPI  Pt presents with diffuse and generalized body aches and myalgias onset about a week ago She additionally notes her BP is higher than normal Associated recent sweats, chills, fatigue, some mild nasal congestion and discharge  recent physical exercise/or strenuous activity, no exposure to hot environment with decreased fluid intake She is unsure if she has any recent sick contacts  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 12/05/2021   Vitamin D deficiency 05/22/2021   Mixed hyperlipidemia 07/09/2020   Current moderate episode of major depressive disorder (HCC) 02/17/2020   Grief reaction 02/17/2020   Morbid obesity (HCC) 05/31/2018   Allergic rhinitis 03/01/2018   Cervical stenosis of spinal canal 08/28/2017   S/P vaginal hysterectomy 09/29/2016   History of Helicobacter pylori infection 08/08/2016   Microcytosis 08/01/2016   Heel spur, left 04/07/2016   Bilateral foot pain 04/04/2016   Anxiety 04/04/2016   Hypertension, goal below 140/90 07/24/2015   Bilateral hip pain 11/17/2014   Atopic dermatitis 11/17/2014   Mild persistent asthma with allergic rhinitis without complication 11/17/2014      Current Outpatient Medications:    albuterol (PROAIR HFA) 108 (90 Base) MCG/ACT inhaler, Inhale 1-2 puffs into the lungs every 4 (four) hours as needed for wheezing or shortness of breath., Disp: 1 each, Rfl: 5   amLODipine (NORVASC) 5 MG tablet, TAKE 1 TABLET BY MOUTH EVERY DAY IN THE EVENING, Disp: 90 tablet, Rfl: 1   hydrOXYzine  (ATARAX/VISTARIL) 10 MG tablet, Take 1-2 tablets (10-20 mg total) by mouth every 6 (six) hours as needed for itching., Disp: 120 tablet, Rfl: 2   metFORMIN (GLUCOPHAGE-XR) 750 MG 24 hr tablet, TAKE 1 TABLET (750) BY MOUTH ONCE DAILY WITH FOOD/MEAL, Disp: 90 tablet, Rfl: 1   rosuvastatin (CRESTOR) 5 MG tablet, Take 1 tablet (5 mg total) by mouth daily., Disp: 90 tablet, Rfl: 3   umeclidinium-vilanterol (ANORO ELLIPTA) 62.5-25 MCG/ACT AEPB, Inhale 1 puff into the lungs daily at 6 (six) AM. (Patient taking differently: Inhale 1 puff into the lungs as needed.), Disp: 60 each, Rfl: 5   Allergies  Allergen Reactions   Meloxicam Other (See Comments)    Felt horrible, headache, bloating, back pain, SHOB; no rash   Zithromax [Azithromycin] Swelling    Tongue    Pseudoephedrine-Guaifenesin Swelling    Of tongue   Shellfish Allergy Rash   Shellfish-Derived Products Rash     Social History   Tobacco Use   Smoking status: Never    Passive exposure: Never   Smokeless tobacco: Never  Vaping Use   Vaping status: Never Used  Substance Use Topics   Alcohol use: Not Currently    Alcohol/week: 0.0 standard drinks of alcohol   Drug use: No      Chart Review Today: I personally reviewed active problem list, medication list, allergies, family history, social history, health maintenance, notes from last encounter, lab results, imaging with the patient/caregiver today.   Review of Systems  Constitutional: Negative.  HENT: Negative.    Eyes: Negative.   Respiratory: Negative.    Cardiovascular: Negative.   Gastrointestinal: Negative.   Endocrine: Negative.   Genitourinary: Negative.   Musculoskeletal: Negative.   Skin: Negative.   Allergic/Immunologic: Negative.   Neurological: Negative.   Hematological: Negative.   Psychiatric/Behavioral: Negative.    All other systems reviewed and are negative.      Objective:   Vitals:   02/17/23 1401  Resp: 16  Height: 5\' 6"  (1.676 m)     Body mass index is 41.55 kg/m.  Physical Exam Vitals and nursing note reviewed.  Constitutional:      General: She is not in acute distress.    Appearance: Normal appearance. She is well-developed. She is obese. She is not ill-appearing, toxic-appearing or diaphoretic.  HENT:     Head: Normocephalic and atraumatic. No right periorbital erythema or left periorbital erythema.     Jaw: There is normal jaw occlusion.     Right Ear: External ear normal.     Left Ear: External ear normal.     Nose: Congestion and rhinorrhea present. No nasal tenderness or mucosal edema. Rhinorrhea is clear.     Right Sinus: No maxillary sinus tenderness or frontal sinus tenderness.     Left Sinus: No maxillary sinus tenderness or frontal sinus tenderness.     Mouth/Throat:     Mouth: Mucous membranes are moist.     Pharynx: Oropharynx is clear. No posterior oropharyngeal erythema.  Eyes:     General: No scleral icterus.       Right eye: No discharge.        Left eye: No discharge.     Conjunctiva/sclera: Conjunctivae normal.  Neck:     Trachea: No tracheal deviation.  Cardiovascular:     Rate and Rhythm: Normal rate and regular rhythm.     Pulses: Normal pulses.     Heart sounds: Normal heart sounds. No murmur heard.    No friction rub. No gallop.  Pulmonary:     Effort: Pulmonary effort is normal. No respiratory distress.     Breath sounds: Normal breath sounds. No stridor.  Musculoskeletal:        General: No swelling or tenderness. Normal range of motion.     Right lower leg: No edema.     Left lower leg: No edema.     Comments: All extremities all muscle compartments soft No spasms   Skin:    General: Skin is warm and dry.     Coloration: Skin is not jaundiced or pale.     Findings: No bruising, lesion or rash.  Neurological:     Mental Status: She is alert. Mental status is at baseline.     Motor: No weakness or abnormal muscle tone.     Coordination: Coordination normal.     Gait:  Gait normal.  Psychiatric:        Mood and Affect: Mood normal.        Behavior: Behavior normal.      Results for orders placed or performed in visit on 11/10/22  Hemoglobin A1c  Result Value Ref Range   Hgb A1c MFr Bld 6.4 (H) <5.7 % of total Hgb   Mean Plasma Glucose 137 mg/dL   eAG (mmol/L) 7.6 mmol/L  COMPLETE METABOLIC PANEL WITH GFR  Result Value Ref Range   Glucose, Bld 120 (H) 65 - 99 mg/dL   BUN 9 7 - 25 mg/dL   Creat 0.27 2.53 - 6.64  mg/dL   eGFR 161 > OR = 60 WR/UEA/5.40J8   BUN/Creatinine Ratio SEE NOTE: 6 - 22 (calc)   Sodium 140 135 - 146 mmol/L   Potassium 3.7 3.5 - 5.3 mmol/L   Chloride 105 98 - 110 mmol/L   CO2 25 20 - 32 mmol/L   Calcium 8.9 8.6 - 10.4 mg/dL   Total Protein 7.1 6.1 - 8.1 g/dL   Albumin 3.8 3.6 - 5.1 g/dL   Globulin 3.3 1.9 - 3.7 g/dL (calc)   AG Ratio 1.2 1.0 - 2.5 (calc)   Total Bilirubin 0.4 0.2 - 1.2 mg/dL   Alkaline phosphatase (APISO) 136 37 - 153 U/L   AST 29 10 - 35 U/L   ALT 42 (H) 6 - 29 U/L  VITAMIN D 25 Hydroxy (Vit-D Deficiency, Fractures)  Result Value Ref Range   Vit D, 25-Hydroxy 26 (L) 30 - 100 ng/mL       Assessment & Plan:     ICD-10-CM   1. Myalgia  M79.10 CK (Creatine Kinase)   body aches, chills, sweats x 1.5 weeks - suspect possible viral illness - no concern for rhabdo or acute msk injury/compartment syndrome- check labs    2. Viral illness  B34.9 CBC with Differential/Platelet    COMPLETE METABOLIC PANEL WITH GFR    CK (Creatine Kinase)   suspect a viral illness with constitutional sx    3. Rhinosinusitis  J32.9    erythematous and swollen nasal mucosa w/o sinus ttp, abx not currently indicated, supportive and symptomatic care    4. Elevated LFTs  R79.89 COMPLETE METABOLIC PANEL WITH GFR   recheck labs- encouraged healthy diet, avoid processed foods    5. Encounter for medication monitoring  Z51.81 CBC with Differential/Platelet    COMPLETE METABOLIC PANEL WITH GFR    CK (Creatine Kinase)    6.  Primary hypertension  I10 COMPLETE METABOLIC PANEL WITH GFR   Bp elevated above goal today but pt is in pain and has acute illness, would prefer to recheck BP again when pt is feeling well, no med changes today       Pt encouraged to rest, push fluids, can use OTC cold/cough/congestion/allergy meds, antipyretics/analgesics  Will be checking her electrolytes, renal function, LFTs, CK  and blood counts   Danelle Berry, PA-C 02/17/23 2:05 PM

## 2023-02-18 LAB — COMPLETE METABOLIC PANEL WITH GFR
AG Ratio: 1.1 (calc) (ref 1.0–2.5)
ALT: 37 U/L — ABNORMAL HIGH (ref 6–29)
AST: 27 U/L (ref 10–35)
Albumin: 3.9 g/dL (ref 3.6–5.1)
Alkaline phosphatase (APISO): 154 U/L — ABNORMAL HIGH (ref 37–153)
BUN: 13 mg/dL (ref 7–25)
CO2: 30 mmol/L (ref 20–32)
Calcium: 9.3 mg/dL (ref 8.6–10.4)
Chloride: 102 mmol/L (ref 98–110)
Creat: 0.72 mg/dL (ref 0.50–1.03)
Globulin: 3.6 g/dL (ref 1.9–3.7)
Glucose, Bld: 90 mg/dL (ref 65–99)
Potassium: 4.3 mmol/L (ref 3.5–5.3)
Sodium: 139 mmol/L (ref 135–146)
Total Bilirubin: 0.3 mg/dL (ref 0.2–1.2)
Total Protein: 7.5 g/dL (ref 6.1–8.1)
eGFR: 101 mL/min/{1.73_m2} (ref >=60)

## 2023-02-18 LAB — CBC WITH DIFFERENTIAL/PLATELET
Absolute Monocytes: 439 {cells}/uL (ref 200–950)
Basophils Absolute: 17 {cells}/uL (ref 0–200)
Basophils Relative: 0.3 %
Eosinophils Absolute: 120 {cells}/uL (ref 15–500)
Eosinophils Relative: 2.1 %
HCT: 42.6 % (ref 35.0–45.0)
Hemoglobin: 13.5 g/dL (ref 11.7–15.5)
Lymphs Abs: 2542 {cells}/uL (ref 850–3900)
MCH: 25.8 pg — ABNORMAL LOW (ref 27.0–33.0)
MCHC: 31.7 g/dL — ABNORMAL LOW (ref 32.0–36.0)
MCV: 81.3 fL (ref 80.0–100.0)
MPV: 11.2 fL (ref 7.5–12.5)
Monocytes Relative: 7.7 %
Neutro Abs: 2582 {cells}/uL (ref 1500–7800)
Neutrophils Relative %: 45.3 %
Platelets: 271 10*3/uL (ref 140–400)
RBC: 5.24 10*6/uL — ABNORMAL HIGH (ref 3.80–5.10)
RDW: 13.7 % (ref 11.0–15.0)
Total Lymphocyte: 44.6 %
WBC: 5.7 10*3/uL (ref 3.8–10.8)

## 2023-02-18 LAB — CK: Total CK: 113 U/L (ref 29–143)

## 2023-02-19 ENCOUNTER — Ambulatory Visit
Admission: EM | Admit: 2023-02-19 | Discharge: 2023-02-19 | Disposition: A | Discharge status: Home / Self Care | Payer: BC Managed Care – PPO | Attending: Emergency Medicine | Admitting: Emergency Medicine

## 2023-02-19 DIAGNOSIS — H9201 Otalgia, right ear: Secondary | ICD-10-CM | POA: Diagnosis not present

## 2023-02-19 DIAGNOSIS — J069 Acute upper respiratory infection, unspecified: Secondary | ICD-10-CM | POA: Diagnosis not present

## 2023-02-19 MED ORDER — FLUTICASONE PROPIONATE 50 MCG/ACT NA SUSP: 1.0000 | Freq: Every day | NASAL | 0 refills | Status: AC

## 2023-02-19 NOTE — ED Provider Notes (Signed)
[facility]    CSN: [id number] Arrival date & time: 02/19/23  1008      History   Chief Complaint Chief Complaint  Patient presents with   Generalized Body Aches   Facial Pain    HPI Alisha Kim is a 52 y.o. female.  Patient presents with 1 week history of bodyaches, myalgias, sinus pressure, right ear pain.  Treating her symptoms with ibuprofen; last taken at 0700.  She denies fever, sore throat, cough, shortness of breath, chest pain, or other symptoms.  Negative COVID test at home yesterday.  Patient was seen by her PCP on 02/17/2023; diagnosed with myalgia, viral illness, rhinosinusitis, elevated LFTs.  Her medical history includes hypertension, asthma, allergies, diabetes, morbid obesity.   The history is provided by the patient and medical records.    Past Medical History:  Diagnosis Date   Adenomyosis 09/2015   Allergy    Anemia    history of   Anxiety    controlled;    Asthma    Depression with anxiety    Diabetes mellitus without complication [facility])    was told she was pre-diabetic   Fibroids 08/14/2016   [place] March 2018   History of Helicobacter pylori infection 08/08/2016   2004   History of kidney stones    when in her 20's   Hydrosalpinx 08/14/2016   right   Hypertension    controlled with medication;    Mixed hyperlipidemia 07/09/2020   Pre-diabetes     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 12/05/2021   Vitamin D deficiency 05/22/2021   Mixed hyperlipidemia 07/09/2020   Current moderate episode of major depressive disorder (HCC) 02/17/2020   Grief reaction 02/17/2020   Morbid obesity (HCC) 05/31/2018   Allergic rhinitis 03/01/2018   Cervical stenosis of spinal canal 08/28/2017   S/P vaginal hysterectomy 09/29/2016   History of Helicobacter pylori infection 08/08/2016    Microcytosis 08/01/2016   Heel spur, left 04/07/2016   Bilateral foot pain 04/04/2016   Anxiety 04/04/2016   Hypertension, goal below 140/90 07/24/2015   Bilateral hip pain 11/17/2014   Atopic dermatitis 11/17/2014   Mild persistent asthma with allergic rhinitis without complication 11/17/2014    Past Surgical History:  Procedure Laterality Date   ABDOMINAL HYSTERECTOMY     BREAST BIOPSY Right 12/02/2021   Stereo Bx, Coil Clip,  FLAT EPITHELIAL ATYPIA   BREAST BIOPSY Right 12/02/2021   Stereo Bx, X clip,  FLAT EPITHELIAL ATYPIA   BREAST BIOPSY Right 07/01/2022   stereo bx, calcs, "X" clip- FLAT EPITHELIAL ATYPIA   BREAST BIOPSY Right 07/01/2022   MM RT BREAST BX W LOC DEV 1ST LESION IMAGE BX SPEC STEREO GUIDE 07/01/2022 [facility]   BREAST LUMPECTOMY WITH RADIO FREQUENCY LOCALIZER Right 12/25/2021   Procedure: BREAST LUMPECTOMY WITH RADIO FREQUENCY LOCALIZER;  Surgeon: Campbell Lerner, MD;  Location: [facility];  Service: General;  Laterality: Right;   BREAST LUMPECTOMY WITH RADIOFREQUENCY TAG IDENTIFICATION Right 12/19/2021   x 2 areas, bracketing   COLONOSCOPY WITH PROPOFOL N/A 05/17/2018   Procedure: COLONOSCOPY WITH PROPOFOL;  Surgeon: Toney Reil, MD;  Location: [facility] ENDOSCOPY;  Service: Gastroenterology;  Laterality: N/A;   DENTAL SURGERY     NO PAST SURGERIES     POLYPECTOMY     TONSILLECTOMY     TUBAL LIGATION  VAGINAL HYSTERECTOMY Bilateral 09/29/2016   Procedure: HYSTERECTOMY VAGINAL WITH BILATERAL SALPINGECTOMY;  Surgeon: Hildred Laser, MD;  Location: [facility] ORS;  Service: Gynecology;  Laterality: Bilateral;    OB History     Gravida  1   Para  1   Term  1   Preterm      AB      Living  1      SAB      IAB      Ectopic      Multiple      Live Births  1            Home Medications    Prior to Admission medications   Medication Sig Start Date End Date Taking? Authorizing Provider  fluticasone (FLONASE) 50 MCG/ACT nasal  spray Place 1 spray into both nostrils daily. 02/19/23  Yes Mickie Bail, NP  albuterol (PROAIR HFA) 108 (90 Base) MCG/ACT inhaler Inhale 1-2 puffs into the lungs every 4 (four) hours as needed for wheezing or shortness of breath. 02/20/21   Caro Laroche, DO  amLODipine (NORVASC) 5 MG tablet TAKE 1 TABLET BY MOUTH EVERY DAY IN THE EVENING 11/25/22   Danelle Berry, PA-C  hydrOXYzine (ATARAX/VISTARIL) 10 MG tablet Take 1-2 tablets (10-20 mg total) by mouth every 6 (six) hours as needed for itching. 07/09/20   Danelle Berry, PA-C  metFORMIN (GLUCOPHAGE-XR) 750 MG 24 hr tablet TAKE 1 TABLET (750) BY MOUTH ONCE DAILY WITH FOOD/MEAL 11/24/22   Danelle Berry, PA-C  umeclidinium-vilanterol (ANORO ELLIPTA) 62.5-25 MCG/ACT AEPB Inhale 1 puff into the lungs daily at 6 (six) AM. Patient taking differently: Inhale 1 puff into the lungs as needed. 08/12/21   Danelle Berry, PA-C    Family History Family History  Problem Relation Age of Onset   Breast cancer Mother 30   Alcohol abuse Mother    Arthritis Mother    Asthma Mother    Depression Mother    Drug abuse Mother    Hypertension Mother    Mental illness Mother    Cancer Mother        breast and lung   Diabetes Mother    Alcohol abuse Father    Drug abuse Father    Cancer Father        lung   Hypertension Brother    Cancer Maternal Grandmother        cervical   Heart disease Maternal Grandmother    Hypertension Maternal Grandmother    Hearing loss Maternal Aunt    Hypertension Maternal Aunt    Stroke Maternal Aunt    Alzheimer's disease Maternal Aunt    Breast cancer Maternal Aunt        mat great aunt    Social History Social History   Tobacco Use   Smoking status: Never    Passive exposure: Never   Smokeless tobacco: Never  Vaping Use   Vaping status: Never Used  Substance Use Topics   Alcohol use: Not Currently    Alcohol/week: 0.0 standard drinks of alcohol   Drug use: No     Allergies   Meloxicam, Zithromax  [azithromycin], Pseudoephedrine-guaifenesin, Shellfish allergy, and Shellfish-derived products   Review of Systems Review of Systems  Constitutional:  Negative for chills and fever.  HENT:  Positive for ear pain and sinus pressure. Negative for ear discharge and sore throat.   Respiratory:  Negative for cough and shortness of breath.   Cardiovascular:  Negative for chest pain and palpitations.  Gastrointestinal:  Negative for diarrhea and vomiting.  Musculoskeletal:  Positive for myalgias. Negative for gait problem and joint swelling.  Skin:  Negative for color change and rash.     Physical Exam Triage Vital Signs ED Triage Vitals  Encounter Vitals Group     BP 02/19/23 1036 136/84     Systolic BP Percentile --      Diastolic BP Percentile --      Pulse --      Resp 02/19/23 1036 17     Temp --      Temp src --      SpO2 --      Weight --      Height --      Head Circumference --      Peak Flow --      Pain Score 02/19/23 1032 7     Pain Loc --      Pain Education --      Exclude from Growth Chart --    No data found.  Updated Vital Signs BP 136/84   Pulse 94   Temp 98 F (36.7 C)   Resp 18   LMP 09/29/2016 Comment: partial  SpO2 97%   Visual Acuity Right Eye Distance:   Left Eye Distance:   Bilateral Distance:    Right Eye Near:   Left Eye Near:    Bilateral Near:     Physical Exam Vitals and nursing note reviewed.  Constitutional:      General: She is not in acute distress.    Appearance: She is well-developed.  HENT:     Right Ear: Tympanic membrane and ear canal normal.     Left Ear: Tympanic membrane and ear canal normal.     Nose: Nose normal.     Mouth/Throat:     Mouth: Mucous membranes are moist.     Pharynx: Oropharynx is clear.  Cardiovascular:     Rate and Rhythm: Normal rate and regular rhythm.     Heart sounds: Normal heart sounds.  Pulmonary:     Effort: Pulmonary effort is normal. No respiratory distress.     Breath sounds:  Normal breath sounds.  Musculoskeletal:     Cervical back: Neck supple.  Lymphadenopathy:     Cervical: No cervical adenopathy.  Skin:    General: Skin is warm and dry.  Neurological:     Mental Status: She is alert.  Psychiatric:        Mood and Affect: Mood normal.        Behavior: Behavior normal.      UC Treatments / Results  Labs (all labs ordered are listed, but only abnormal results are displayed) Labs Reviewed - No data to display  EKG   Radiology No results found.  Procedures Procedures (including critical care time)  Medications Ordered in UC Medications - No data to display  Initial Impression / Assessment and Plan / UC Course  I have reviewed the triage vital signs and the nursing notes.  Pertinent labs & imaging results that were available during my care of the patient were reviewed by me and considered in my medical decision making (see chart for details).    Viral URI, right otalgia.  No indication of bacterial infection at this time.  Afebrile and vital signs are stable.  Treating with Flonase nasal spray, Tylenol or ibuprofen as needed, warm compresses to right ear as needed.  Instructed her to follow-up with her PCP.  Education provided on  viral respiratory illness and earache.  ED precautions given.  Patient agrees to plan of care.  Final Clinical Impressions(s) / UC Diagnoses   Final diagnoses:  Viral URI  Acute otalgia, right     Discharge Instructions      Use the Flonase nasal spray as directed.    Follow up with your primary care provider tomorrow.  Go to the emergency department if you have worsening symptoms.        ED Prescriptions     Medication Sig Dispense Auth. Provider   fluticasone (FLONASE) 50 MCG/ACT nasal spray Place 1 spray into both nostrils daily. 16 g Mickie Bail, NP      PDMP not reviewed this encounter.   Mickie Bail, NP 02/19/23 1103

## 2023-02-19 NOTE — Discharge Instructions (Addendum)
Use the Flonase nasal spray as directed.    Follow up with your primary care provider tomorrow.  Go to the emergency department if you have worsening symptoms.

## 2023-02-19 NOTE — ED Triage Notes (Signed)
Patient to Urgent Care with complaints of facial pressure and generalized body aches. Right sided ear pain and reports feeling tender behind her ear.   Symptoms started one week ago. Negative home Covid test yesterday.  Has been taking ibuprofen.

## 2023-03-02 ENCOUNTER — Other Ambulatory Visit: Payer: Self-pay | Admitting: Family Medicine

## 2023-03-02 DIAGNOSIS — E119 Type 2 diabetes mellitus without complications: Secondary | ICD-10-CM

## 2023-03-02 DIAGNOSIS — I10 Essential (primary) hypertension: Secondary | ICD-10-CM

## 2023-03-16 LAB — HM DIABETES EYE EXAM

## 2023-03-19 ENCOUNTER — Ambulatory Visit (INDEPENDENT_AMBULATORY_CARE_PROVIDER_SITE_OTHER): Payer: BC Managed Care – PPO

## 2023-03-19 ENCOUNTER — Ambulatory Visit
Admission: EM | Admit: 2023-03-19 | Discharge: 2023-03-19 | Disposition: A | Discharge status: Home / Self Care | Payer: BC Managed Care – PPO | Attending: Family Medicine | Admitting: Family Medicine

## 2023-03-19 DIAGNOSIS — J4521 Mild intermittent asthma with (acute) exacerbation: Secondary | ICD-10-CM

## 2023-03-19 DIAGNOSIS — R051 Acute cough: Secondary | ICD-10-CM

## 2023-03-19 DIAGNOSIS — J40 Bronchitis, not specified as acute or chronic: Secondary | ICD-10-CM | POA: Diagnosis not present

## 2023-03-19 DIAGNOSIS — R059 Cough, unspecified: Secondary | ICD-10-CM | POA: Diagnosis not present

## 2023-03-19 MED ORDER — AMOXICILLIN-POT CLAVULANATE 875-125 MG PO TABS
1.0000 | ORAL_TABLET | Freq: Two times a day (BID) | ORAL | 0 refills | Status: DC
Start: 2023-03-21 — End: 2023-07-31

## 2023-03-19 MED ORDER — PREDNISONE 20 MG PO TABS
40.0000 mg | ORAL_TABLET | Freq: Every day | ORAL | 0 refills | Status: AC
Start: 2023-03-19 — End: 2023-03-24

## 2023-03-19 MED ORDER — PROMETHAZINE-DM 6.25-15 MG/5ML PO SYRP
5.0000 mL | ORAL_SOLUTION | Freq: Four times a day (QID) | ORAL | 0 refills | Status: DC | PRN
Start: 2023-03-19 — End: 2023-07-31

## 2023-03-19 NOTE — ED Provider Notes (Signed)
[facility]    CSN: [id number] Arrival date & time: 03/19/23  1118      History   Chief Complaint Chief Complaint  Patient presents with   Cough    HPI Alisha Kim is a 52 y.o. female  presents for evaluation of URI symptoms for 1.5 weeks. Patient reports associated symptoms of productive cough with yellow sputum.  States she tested positive for COVID at the beginning of her symptoms and has recently tested negative.  Did have fevers and sore throat initially but these have resolved.  Denies N/V/D, ear pain, sore throat, body aches, shortness of breath. Patient does have a hx of asthma. Patient does not have a history of smoking.  Reports no sick contacts.  Patient states she has had COVID x 2 in the past without complication or hospitalization.  Pt has taken allergy medicine OTC for symptoms. Pt has no other concerns at this time.    Cough   Past Medical History:  Diagnosis Date   Adenomyosis 09/2015   Allergy    Anemia    history of   Anxiety    controlled;    Asthma    Depression with anxiety    Diabetes mellitus without complication [facility])    was told she was pre-diabetic   Fibroids 08/14/2016   [place] March 2018   History of Helicobacter pylori infection 08/08/2016   2004   History of kidney stones    when in her 20's   Hydrosalpinx 08/14/2016   right   Hypertension    controlled with medication;    Mixed hyperlipidemia 07/09/2020   Pre-diabetes     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 12/05/2021   Vitamin D deficiency 05/22/2021   Mixed hyperlipidemia 07/09/2020   Current moderate episode of major depressive disorder (HCC) 02/17/2020   Grief reaction 02/17/2020   Morbid obesity (HCC) 05/31/2018   Allergic rhinitis 03/01/2018   Cervical stenosis of spinal canal 08/28/2017    S/P vaginal hysterectomy 09/29/2016   History of Helicobacter pylori infection 08/08/2016   Microcytosis 08/01/2016   Heel spur, left 04/07/2016   Bilateral foot pain 04/04/2016   Anxiety 04/04/2016   Hypertension, goal below 140/90 07/24/2015   Bilateral hip pain 11/17/2014   Atopic dermatitis 11/17/2014   Mild persistent asthma with allergic rhinitis without complication 11/17/2014    Past Surgical History:  Procedure Laterality Date   ABDOMINAL HYSTERECTOMY     BREAST BIOPSY Right 12/02/2021   Stereo Bx, Coil Clip,  FLAT EPITHELIAL ATYPIA   BREAST BIOPSY Right 12/02/2021   Stereo Bx, X clip,  FLAT EPITHELIAL ATYPIA   BREAST BIOPSY Right 07/01/2022   stereo bx, calcs, "X" clip- FLAT EPITHELIAL ATYPIA   BREAST BIOPSY Right 07/01/2022   MM RT BREAST BX W LOC DEV 1ST LESION IMAGE BX SPEC STEREO GUIDE 07/01/2022 [facility]   BREAST LUMPECTOMY WITH RADIO FREQUENCY LOCALIZER Right 12/25/2021   Procedure: BREAST LUMPECTOMY WITH RADIO FREQUENCY LOCALIZER;  Surgeon: Campbell Lerner, MD;  Location: [facility] OR[facility];  Service: General;  Laterality: Right;   BREAST LUMPECTOMY WITH RADIOFREQUENCY TAG IDENTIFICATION Right 12/19/2021   x 2 areas, bracketing   COLONOSCOPY WITH PROPOFOL N/A 05/17/2018   Procedure: COLONOSCOPY WITH PROPOFOL;  Surgeon: Toney Reil, MD;  Location: [facility]COPY;  Service: Gastroenterology;  Laterality: N/A;   DENTAL SURGERY  NO PAST SURGERIES     POLYPECTOMY     TONSILLECTOMY     TUBAL LIGATION     VAGINAL HYSTERECTOMY Bilateral 09/29/2016   Procedure: HYSTERECTOMY VAGINAL WITH BILATERAL SALPINGECTOMY;  Surgeon: Hildred Laser, MD;  Location: [facility] ORS;  Service: Gynecology;  Laterality: Bilateral;    OB History     Gravida  1   Para  1   Term  1   Preterm      AB      Living  1      SAB      IAB      Ectopic      Multiple      Live Births  1            Home Medications    Prior to Admission medications   Medication Sig  Start Date End Date Taking? Authorizing Provider  albuterol (PROAIR HFA) 108 (90 Base) MCG/ACT inhaler Inhale 1-2 puffs into the lungs every 4 (four) hours as needed for wheezing or shortness of breath. 02/20/21  Yes Caro Laroche, DO  amLODipine (NORVASC) 5 MG tablet TAKE 1 TABLET BY MOUTH EVERY DAY IN THE EVENING 11/25/22  Yes Danelle Berry, PA-C  amoxicillin-clavulanate (AUGMENTIN) 875-125 MG tablet Take 1 tablet by mouth every 12 (twelve) hours. 03/21/23  Yes Radford Pax, NP  fluticasone (FLONASE) 50 MCG/ACT nasal spray Place 1 spray into both nostrils daily. 02/19/23  Yes Mickie Bail, NP  hydrOXYzine (ATARAX/VISTARIL) 10 MG tablet Take 1-2 tablets (10-20 mg total) by mouth every 6 (six) hours as needed for itching. 07/09/20  Yes Danelle Berry, PA-C  metFORMIN (GLUCOPHAGE-XR) 750 MG 24 hr tablet TAKE 1 TABLET (750) BY MOUTH ONCE DAILY WITH FOOD/MEAL 11/24/22  Yes Danelle Berry, PA-C  predniSONE (DELTASONE) 20 MG tablet Take 2 tablets (40 mg total) by mouth daily with breakfast for 5 days. 03/19/23 03/24/23 Yes Radford Pax, NP  promethazine-dextromethorphan (PROMETHAZINE-DM) 6.25-15 MG/5ML syrup Take 5 mLs by mouth 4 (four) times daily as needed for cough. 03/19/23  Yes Radford Pax, NP  umeclidinium-vilanterol (ANORO ELLIPTA) 62.5-25 MCG/ACT AEPB Inhale 1 puff into the lungs daily at 6 (six) AM. Patient taking differently: Inhale 1 puff into the lungs as needed. 08/12/21  Yes Danelle Berry, PA-C    Family History Family History  Problem Relation Age of Onset   Breast cancer Mother 17   Alcohol abuse Mother    Arthritis Mother    Asthma Mother    Depression Mother    Drug abuse Mother    Hypertension Mother    Mental illness Mother    Cancer Mother        breast and lung   Diabetes Mother    Alcohol abuse Father    Drug abuse Father    Cancer Father        lung   Hypertension Brother    Cancer Maternal Grandmother        cervical   Heart disease Maternal Grandmother     Hypertension Maternal Grandmother    Hearing loss Maternal Aunt    Hypertension Maternal Aunt    Stroke Maternal Aunt    Alzheimer's disease Maternal Aunt    Breast cancer Maternal Aunt        mat great aunt    Social History Social History   Tobacco Use   Smoking status: Never    Passive exposure: Never   Smokeless tobacco: Never  Vaping Use   Vaping  status: Never Used  Substance Use Topics   Alcohol use: Not Currently    Alcohol/week: 0.0 standard drinks of alcohol   Drug use: No     Allergies   Meloxicam, Zithromax [azithromycin], Pseudoephedrine-guaifenesin, Shellfish allergy, and Shellfish-derived products   Review of Systems Review of Systems  Respiratory:  Positive for cough.      Physical Exam Triage Vital Signs ED Triage Vitals  Encounter Vitals Group     BP 03/19/23 1217 (!) 139/94     Systolic BP Percentile --      Diastolic BP Percentile --      Pulse Rate 03/19/23 1217 78     Resp --      Temp 03/19/23 1217 98.2 F (36.8 C)     Temp Source 03/19/23 1217 Oral     SpO2 03/19/23 1217 100 %     Weight 03/19/23 1216 250 lb (113.4 kg)     Height 03/19/23 1216 5\' 5"  (1.651 m)     Head Circumference --      Peak Flow --      Pain Score 03/19/23 1215 0     Pain Loc --      Pain Education --      Exclude from Growth Chart --    No data found.  Updated Vital Signs BP (!) 139/94 (BP Location: Left Arm)   Pulse 78   Temp 98.2 F (36.8 C) (Oral)   Ht 5\' 5"  (1.651 m)   Wt 250 lb (113.4 kg)   LMP 09/29/2016 Comment: partial  SpO2 100%   BMI 41.60 kg/m   Visual Acuity Right Eye Distance:   Left Eye Distance:   Bilateral Distance:    Right Eye Near:   Left Eye Near:    Bilateral Near:     Physical Exam Vitals and nursing note reviewed.  Constitutional:      General: She is not in acute distress.    Appearance: Normal appearance. She is well-developed. She is not ill-appearing.  HENT:     Head: Normocephalic and atraumatic.     Right  Ear: Tympanic membrane and ear canal normal.     Left Ear: Tympanic membrane and ear canal normal.     Nose: Congestion present.     Mouth/Throat:     Mouth: Mucous membranes are moist.     Pharynx: Oropharynx is clear. Uvula midline. No posterior oropharyngeal erythema.     Tonsils: No tonsillar exudate or tonsillar abscesses.  Eyes:     Conjunctiva/sclera: Conjunctivae normal.     Pupils: Pupils are equal, round, and reactive to light.  Cardiovascular:     Rate and Rhythm: Normal rate and regular rhythm.     Heart sounds: Normal heart sounds.  Pulmonary:     Effort: Pulmonary effort is normal.     Breath sounds: Normal breath sounds.  Musculoskeletal:     Cervical back: Normal range of motion and neck supple.  Lymphadenopathy:     Cervical: No cervical adenopathy.  Skin:    General: Skin is warm and dry.  Neurological:     General: No focal deficit present.     Mental Status: She is alert and oriented to person, place, and time.  Psychiatric:        Mood and Affect: Mood normal.        Behavior: Behavior normal.      UC Treatments / Results  Labs (all labs ordered are listed, but only abnormal results are displayed)  Labs Reviewed - No data to display  EKG   Radiology No results found.  Procedures Procedures (including critical care time)  Medications Ordered in UC Medications - No data to display  Initial Impression / Assessment and Plan / UC Course  I have reviewed the triage vital signs and the nursing notes.  Pertinent labs & imaging results that were available during my care of the patient were reviewed by me and considered in my medical decision making (see chart for details).     Reviewed exam and symptoms with patient.  No red flags.  Wet read of x-ray without pneumonia.  Will contact patient if any abnormalities with radiology overread.  Discussed bronchitis/asthma flare.  Promethazine DM as needed for cough.  Side effect profile reviewed.  Prednisone  daily for 5 days.  Provisional prescription for Augmentin provided with patient instructed not to take unless symptoms do not improve or worsen over the next 2 to 3 days and she verbalized understanding.  PCP follow-up 2 to 3 days for recheck.  ER precautions reviewed and patient verbalized understanding. Final Clinical Impressions(s) / UC Diagnoses   Final diagnoses:  Acute cough  Bronchitis  Mild intermittent asthma with exacerbation     Discharge Instructions      Start promethazine DM as needed for cough.  Please add this medication to make you drowsy.  Do not drink alcohol or drive on this medication.  Start prednisone daily for 5 days.  A provisional prescription for antibiotic has been provided.  Please do not take in lesser symptoms do not improve or worsen over the next 2 to 3 days.  Lots of rest and fluids.  Continue your inhalers as prescribed.  Follow-up with your PCP in 2 to 3 days for recheck.  Please go to the ER if you develop any worsening symptoms.  I hope you feel better soon!     ED Prescriptions     Medication Sig Dispense Auth. Provider   promethazine-dextromethorphan (PROMETHAZINE-DM) 6.25-15 MG/5ML syrup Take 5 mLs by mouth 4 (four) times daily as needed for cough. 118 mL Radford Pax, NP   amoxicillin-clavulanate (AUGMENTIN) 875-125 MG tablet Take 1 tablet by mouth every 12 (twelve) hours. 14 tablet Radford Pax, NP   predniSONE (DELTASONE) 20 MG tablet Take 2 tablets (40 mg total) by mouth daily with breakfast for 5 days. 10 tablet Radford Pax, NP      PDMP not reviewed this encounter.   Radford Pax, NP 03/19/23 1315

## 2023-03-19 NOTE — ED Triage Notes (Signed)
Pt c/o continued productive cough with yellow phlegm x2weeks  Pt states that she tested positive for covid around 1 1/2 weeks ago

## 2023-03-19 NOTE — Discharge Instructions (Addendum)
Start promethazine DM as needed for cough.  Please add this medication to make you drowsy.  Do not drink alcohol or drive on this medication.  Start prednisone daily for 5 days.  A provisional prescription for antibiotic has been provided.  Please do not take in lesser symptoms do not improve or worsen over the next 2 to 3 days.  Lots of rest and fluids.  Continue your inhalers as prescribed.  Follow-up with your PCP in 2 to 3 days for recheck.  Please go to the ER if you develop any worsening symptoms.  I hope you feel better soon!

## 2023-03-23 ENCOUNTER — Encounter: Payer: Self-pay | Admitting: Family Medicine

## 2023-07-31 ENCOUNTER — Ambulatory Visit
Admission: EM | Admit: 2023-07-31 | Discharge: 2023-07-31 | Disposition: A | Discharge status: Home / Self Care | Payer: BC Managed Care – PPO | Attending: Emergency Medicine | Admitting: Emergency Medicine

## 2023-07-31 ENCOUNTER — Encounter: Payer: Self-pay | Admitting: Emergency Medicine

## 2023-07-31 DIAGNOSIS — M778 Other enthesopathies, not elsewhere classified: Secondary | ICD-10-CM | POA: Diagnosis not present

## 2023-07-31 MED ORDER — IBUPROFEN 600 MG PO TABS: 600.0000 mg | ORAL_TABLET | Freq: Four times a day (QID) | ORAL | 0 refills | Status: DC | PRN

## 2023-07-31 NOTE — ED Triage Notes (Signed)
 Patient reports muscle pain in her left bicep and left forearm since Monday.  Patient denies any injury.  Patient has tried OTC pain creams.

## 2023-07-31 NOTE — Discharge Instructions (Addendum)
 As we discussed, I suspect that you have tendinitis in your forearm and bicep muscle group.  Take the 600 milligrams ibuprofen every 6 hours with food help with pain and inflammation.  Continue to wear your Ace wrap to support the muscles and aid in pain relief.  You may apply ice to your bicep and left forearm for 20 minutes at a time, 2-3 times a day, as needed for pain and inflammation.  Make sure you have a cloth between the ice and your skin so as to not cause skin damage.  Follow the physical therapy exercises given in your discharge paperwork.  If you have not had any improvement of your symptoms in the next week I recommend that you follow-up with orthopedics such as [facility] here in [place] or in [place].

## 2023-07-31 NOTE — ED Provider Notes (Addendum)
 [facility]    CSN: [id number] Arrival date & time: 07/31/23  1443      History   Chief Complaint Chief Complaint  Patient presents with   Muscle Pain    Left arm    HPI Alisha Kim is a 53 y.o. female.   HPI  53 year old female with past medical history significant for hypertension, mixed hyperlipidemia, diabetes, asthma, and anxiety presents for evaluation of 5 days worth of pain in her left bicep and proximal left forearm.  This is not associate with any injury, heavy lifting, or changes in physical activity.  She has been using an Ace bandage to help with pain.  She does endorse that she will occasionally get some numbness in the fingers of her left hand but that is not new.  Past Medical History:  Diagnosis Date   Adenomyosis 09/2015   Allergy    Anemia    history of   Anxiety    controlled;    Asthma    Depression with anxiety    Diabetes mellitus without complication [facility])    was told she was pre-diabetic   Fibroids 08/14/2016   [place] March 2018   History of Helicobacter pylori infection 08/08/2016   2004   History of kidney stones    when in her 20's   Hydrosalpinx 08/14/2016   right   Hypertension    controlled with medication;    Mixed hyperlipidemia 07/09/2020   Pre-diabetes     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 12/05/2021   Vitamin D deficiency 05/22/2021   Mixed hyperlipidemia 07/09/2020   Current moderate episode of major depressive disorder (HCC) 02/17/2020   Grief reaction 02/17/2020   Morbid obesity (HCC) 05/31/2018   Allergic rhinitis 03/01/2018   Cervical stenosis of spinal canal 08/28/2017   S/P vaginal hysterectomy 09/29/2016   History of Helicobacter pylori infection 08/08/2016   Microcytosis 08/01/2016   Heel spur, left 04/07/2016   Bilateral foot  pain 04/04/2016   Anxiety 04/04/2016   Hypertension, goal below 140/90 07/24/2015   Bilateral hip pain 11/17/2014   Atopic dermatitis 11/17/2014   Mild persistent asthma with allergic rhinitis without complication 11/17/2014    Past Surgical History:  Procedure Laterality Date   ABDOMINAL HYSTERECTOMY     BREAST BIOPSY Right 12/02/2021   Stereo Bx, Coil Clip,  FLAT EPITHELIAL ATYPIA   BREAST BIOPSY Right 12/02/2021   Stereo Bx, X clip,  FLAT EPITHELIAL ATYPIA   BREAST BIOPSY Right 07/01/2022   stereo bx, calcs, "X" clip- FLAT EPITHELIAL ATYPIA   BREAST BIOPSY Right 07/01/2022   MM RT BREAST BX W LOC DEV 1ST LESION IMAGE BX SPEC STEREO GUIDE 07/01/2022 [facility]   BREAST LUMPECTOMY WITH RADIO FREQUENCY LOCALIZER Right 12/25/2021   Procedure: BREAST LUMPECTOMY WITH RADIO FREQUENCY LOCALIZER;  Surgeon: Campbell Lerner, MD;  Location: [facility];  Service: General;  Laterality: Right;   BREAST LUMPECTOMY WITH RADIOFREQUENCY TAG IDENTIFICATION Right 12/19/2021   x 2 areas, bracketing   COLONOSCOPY WITH PROPOFOL N/A 05/17/2018   Procedure: COLONOSCOPY WITH PROPOFOL;  Surgeon: Toney Reil, MD;  Location: [facility];  Service: Gastroenterology;  Laterality: N/A;   DENTAL SURGERY     NO PAST SURGERIES     POLYPECTOMY     TONSILLECTOMY     TUBAL LIGATION     VAGINAL  HYSTERECTOMY Bilateral 09/29/2016   Procedure: HYSTERECTOMY VAGINAL WITH BILATERAL SALPINGECTOMY;  Surgeon: Hildred Laser, MD;  Location: [facility];  Service: Gynecology;  Laterality: Bilateral;    OB History     Gravida  1   Para  1   Term  1   Preterm      AB      Living  1      SAB      IAB      Ectopic      Multiple      Live Births  1            Home Medications    Prior to Admission medications   Medication Sig Start Date End Date Taking? Authorizing Provider  ibuprofen (ADVIL) 600 MG tablet Take 1 tablet (600 mg total) by mouth every 6 (six) hours as needed. 07/31/23  Yes  Becky Augusta, NP  albuterol [facility] HFA) 108 (90 Base) MCG/ACT inhaler Inhale 1-2 puffs into the lungs every 4 (four) hours as needed for wheezing or shortness of breath. 02/20/21   Caro Laroche, DO  amLODipine (NORVASC) 5 MG tablet TAKE 1 TABLET BY MOUTH EVERY DAY IN THE EVENING 11/25/22   Danelle Berry, PA-C  fluticasone (FLONASE) 50 MCG/ACT nasal spray Place 1 spray into both nostrils daily. 02/19/23   Mickie Bail, NP  hydrOXYzine (ATARAX/VISTARIL) 10 MG tablet Take 1-2 tablets (10-20 mg total) by mouth every 6 (six) hours as needed for itching. 07/09/20   Danelle Berry, PA-C  metFORMIN (GLUCOPHAGE-XR) 750 MG 24 hr tablet TAKE 1 TABLET (750) BY MOUTH ONCE DAILY WITH FOOD/MEAL 11/24/22   Danelle Berry, PA-C  umeclidinium-vilanterol (ANORO ELLIPTA) 62.5-25 MCG/ACT AEPB Inhale 1 puff into the lungs daily at 6 (six) AM. Patient taking differently: Inhale 1 puff into the lungs as needed. 08/12/21   Danelle Berry, PA-C    Family History Family History  Problem Relation Age of Onset   Breast cancer Mother 52   Alcohol abuse Mother    Arthritis Mother    Asthma Mother    Depression Mother    Drug abuse Mother    Hypertension Mother    Mental illness Mother    Cancer Mother        breast and lung   Diabetes Mother    Alcohol abuse Father    Drug abuse Father    Cancer Father        lung   Hypertension Brother    Cancer Maternal Grandmother        cervical   Heart disease Maternal Grandmother    Hypertension Maternal Grandmother    Hearing loss Maternal Aunt    Hypertension Maternal Aunt    Stroke Maternal Aunt    Alzheimer's disease Maternal Aunt    Breast cancer Maternal Aunt        mat great aunt    Social History Social History   Tobacco Use   Smoking status: Never    Passive exposure: Never   Smokeless tobacco: Never  Vaping Use   Vaping status: Never Used  Substance Use Topics   Alcohol use: Not Currently    Alcohol/week: 0.0 standard drinks of alcohol   Drug use: No      Allergies   Meloxicam, Zithromax [azithromycin], Pseudoephedrine-guaifenesin, Shellfish allergy, and Shellfish-derived products   Review of Systems Review of Systems  Musculoskeletal:  Positive for arthralgias and myalgias. Negative for joint swelling.  Neurological:  Positive for numbness. Negative for weakness.  Physical Exam Triage Vital Signs ED Triage Vitals  Encounter Vitals Group     BP 07/31/23 1509 136/89     Systolic BP Percentile --      Diastolic BP Percentile --      Pulse Rate 07/31/23 1509 75     Resp 07/31/23 1509 14     Temp 07/31/23 1509 97.7 F (36.5 C)     Temp Source 07/31/23 1509 Oral     SpO2 07/31/23 1509 98 %     Weight 07/31/23 1508 250 lb (113.4 kg)     Height 07/31/23 1508 5\' 5"  (1.651 m)     Head Circumference --      Peak Flow --      Pain Score 07/31/23 1508 8     Pain Loc --      Pain Education --      Exclude from Growth Chart --    No data found.  Updated Vital Signs BP 136/89 (BP Location: Right Arm)   Pulse 75   Temp 97.7 F (36.5 C) (Oral)   Resp 14   Ht 5\' 5"  (1.651 m)   Wt 250 lb (113.4 kg)   LMP 09/29/2016 Comment: partial  SpO2 98%   BMI 41.60 kg/m   Visual Acuity Right Eye Distance:   Left Eye Distance:   Bilateral Distance:    Right Eye Near:   Left Eye Near:    Bilateral Near:     Physical Exam Vitals and nursing note reviewed.  Constitutional:      Appearance: Normal appearance. She is not ill-appearing.  HENT:     Head: Normocephalic and atraumatic.  Musculoskeletal:        General: Tenderness present. No swelling, deformity or signs of injury. Normal range of motion.  Skin:    General: Skin is warm and dry.     Capillary Refill: Capillary refill takes less than 2 seconds.     Findings: No bruising or erythema.  Neurological:     General: No focal deficit present.     Mental Status: She is alert and oriented to person, place, and time.      UC Treatments / Results  Labs (all labs  ordered are listed, but only abnormal results are displayed) Labs Reviewed - No data to display  EKG   Radiology No results found.  Procedures Procedures (including critical care time)  Medications Ordered in UC Medications - No data to display  Initial Impression / Assessment and Plan / UC Course  I have reviewed the triage vital signs and the nursing notes.  Pertinent labs & imaging results that were available during my care of the patient were reviewed by me and considered in my medical decision making (see chart for details).   Patient is a pleasant, nontoxic-appearing 53 year old female presenting for evaluation of pain in the left bicep and forearm as outlined in the HPI above.  On exam patient's left bicep and forearm are normal anatomical alignment and free of edema, ecchymosis, or erythema.  Patient does have some mild pain with palpation of the origin of the bicep as well as with palpation of the extensor carpi radialis muscle group.  No pain with palpation of the lateral epicondyle or medial epicondyle of the elbow.  No pain with supination and pronation of the forearm but she does have pain with resisted flexion and extension of her left forearm.  I suspect that the patient is experiencing tendinitis in her left elbow that is  causing her pain.  I will discharge her home on 6 and milligrams ibuprofen every 6 hours to help with pain and inflammation.  We also discussed applying ice to help with pain and inflammation.  She should continue to wear her Ace wrap and I will give her home physical therapy exercises to perform.  If her symptoms do not improve in the next week I recommend that she follow-up with orthopedics as she may need dedicated physical therapy.   Final Clinical Impressions(s) / UC Diagnoses   Final diagnoses:  Tendinitis of elbow or forearm     Discharge Instructions      As we discussed, I suspect that you have tendinitis in your forearm and bicep muscle  group.  Take the 600 milligrams ibuprofen every 6 hours with food help with pain and inflammation.  Continue to wear your Ace wrap to support the muscles and aid in pain relief.  You may apply ice to your bicep and left forearm for 20 minutes at a time, 2-3 times a day, as needed for pain and inflammation.  Make sure you have a cloth between the ice and your skin so as to not cause skin damage.  Follow the physical therapy exercises given in your discharge paperwork.  If you have not had any improvement of your symptoms in the next week I recommend that you follow-up with orthopedics such as [facility] here in [place] or in [place].     ED Prescriptions     Medication Sig Dispense Auth. Provider   ibuprofen (ADVIL) 600 MG tablet Take 1 tablet (600 mg total) by mouth every 6 (six) hours as needed. 30 tablet Becky Augusta, NP      PDMP not reviewed this encounter.   Becky Augusta, NP 07/31/23 1523    Becky Augusta, NP 07/31/23 1524

## 2023-08-17 ENCOUNTER — Ambulatory Visit: Payer: Self-pay | Admitting: Family Medicine

## 2023-08-17 NOTE — Telephone Encounter (Signed)
 Alisha Kim calling because when she got out of bed the room was spinning. She had vertigo last week but it self resolved. Happened years ago, unsure why this happened. She has not been evaluated for Vertigo.  Also experiencing sinus pressure, no other symptoms. Visit scheduled with PA Tapia.    Copied from CRM [id number]. Topic: Clinical - Red Word Triage >> Aug 17, 2023 12:02 PM [name] wrote: Red Word that prompted transfer to Nurse Triage: vertigo- woke this am with it Reason for Disposition  [1] MODERATE dizziness (e.g., vertigo; feels very unsteady, interferes with normal activities) AND [2] has NOT been evaluated by doctor (or NP/PA) for this  Answer Assessment - Initial Assessment Questions 1. DESCRIPTION: "Describe your dizziness."     Room spinning  2. VERTIGO: "Do you feel like either you or the room is spinning or tilting?"      Room spinning  3. LIGHTHEADED: "Do you feel lightheaded?" (e.g., somewhat faint, woozy, weak upon standing)     No 4. SEVERITY: "How bad is it?"  "Can you walk?"   - MILD: Feels slightly dizzy and unsteady, but is walking normally.   - MODERATE: Feels unsteady when walking, but not falling; interferes with normal activities (e.g., school, work).   - SEVERE: Unable to walk without falling, or requires assistance to walk without falling.     Moderate 5. ONSET:  "When did the dizziness begin?"     This morning 6. AGGRAVATING FACTORS: "Does anything make it worse?" (e.g., standing, change in head position)     Standing up from laying. Turning head to fast 7. CAUSE: "What do you think is causing the dizziness?"     unsure 8. RECURRENT SYMPTOM: "Have you had dizziness before?" If Yes, ask: "When was the last time?" "What happened that time?"     Last week similar, that resolved.  9. OTHER SYMPTOMS: "Do you have any other symptoms?" (e.g., headache, weakness, numbness, vomiting, earache)     Sinus pressure  Protocols used: Dizziness - Vertigo-A-AH

## 2023-08-18 ENCOUNTER — Encounter: Payer: Self-pay | Admitting: Family Medicine

## 2023-08-18 ENCOUNTER — Ambulatory Visit (INDEPENDENT_AMBULATORY_CARE_PROVIDER_SITE_OTHER): Admitting: Family Medicine

## 2023-08-18 VITALS — Resp 16 | Ht 65.0 in | Wt 265.0 lb

## 2023-08-18 DIAGNOSIS — M79632 Pain in left forearm: Secondary | ICD-10-CM

## 2023-08-18 DIAGNOSIS — H8111 Benign paroxysmal vertigo, right ear: Secondary | ICD-10-CM | POA: Diagnosis not present

## 2023-08-18 MED ORDER — MECLIZINE HCL 25 MG PO TABS
12.5000 mg | ORAL_TABLET | Freq: Three times a day (TID) | ORAL | 1 refills | Status: DC | PRN
Start: 2023-08-18 — End: 2023-12-28

## 2023-08-18 NOTE — Progress Notes (Signed)
 Patient ID: Alisha Kim, female    DOB: [birth year], 53 y.o.   MRN: [id number]  PCP: Danelle Berry, PA-C  Chief Complaint  Patient presents with   Dizziness    x1 week- thought is passed, but worsened Monday AM   Arm Pain    L, x2 weeks. Went to UC and told tendinitis- worsening    Diarrhea    Started last night    Subjective:   Alisha Kim is a 53 y.o. female, presents to clinic with CC of the following:  Dizziness This is a new problem. The current episode started in the past 7 days. The problem occurs 2 to 4 times per day. The problem has been gradually worsening. Associated symptoms include congestion and vertigo. Pertinent negatives include no abdominal pain, anorexia, arthralgias, chest pain, chills, coughing (pressure), diaphoresis, fever, headaches, joint swelling, myalgias, nausea, neck pain, numbness, rash, sore throat, swollen glands, urinary symptoms, visual change, vomiting or weakness. Exacerbated by: head movements, rolling over in bed and first getting up. Treatments tried: OTC anti-vert oil. The treatment provided no relief.       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 12/05/2021   Vitamin D deficiency 05/22/2021   Mixed hyperlipidemia 07/09/2020   Current moderate episode of major depressive disorder (HCC) 02/17/2020   Grief reaction 02/17/2020   Morbid obesity (HCC) 05/31/2018   Allergic rhinitis 03/01/2018   Cervical stenosis of spinal canal 08/28/2017   S/P vaginal hysterectomy 09/29/2016   History of Helicobacter pylori infection 08/08/2016   Microcytosis 08/01/2016   Heel spur, left 04/07/2016   Bilateral foot pain 04/04/2016   Anxiety 04/04/2016   Hypertension, goal below 140/90 07/24/2015   Bilateral hip pain 11/17/2014   Atopic dermatitis 11/17/2014   Mild persistent asthma  with allergic rhinitis without complication 11/17/2014      Current Outpatient Medications:    albuterol (PROAIR HFA) 108 (90 Base) MCG/ACT inhaler, Inhale 1-2 puffs into the lungs every 4 (four) hours as needed for wheezing or shortness of breath., Disp: 1 each, Rfl: 5   amLODipine (NORVASC) 5 MG tablet, TAKE 1 TABLET BY MOUTH EVERY DAY IN THE EVENING, Disp: 90 tablet, Rfl: 1   fluticasone (FLONASE) 50 MCG/ACT nasal spray, Place 1 spray into both nostrils daily., Disp: 16 g, Rfl: 0   hydrOXYzine (ATARAX/VISTARIL) 10 MG tablet, Take 1-2 tablets (10-20 mg total) by mouth every 6 (six) hours as needed for itching., Disp: 120 tablet, Rfl: 2   ibuprofen (ADVIL) 600 MG tablet, Take 1 tablet (600 mg total) by mouth every 6 (six) hours as needed., Disp: 30 tablet, Rfl: 0   metFORMIN (GLUCOPHAGE-XR) 750 MG 24 hr tablet, TAKE 1 TABLET (750) BY MOUTH ONCE DAILY WITH FOOD/MEAL, Disp: 90 tablet, Rfl: 1   umeclidinium-vilanterol (ANORO ELLIPTA) 62.5-25 MCG/ACT AEPB, Inhale 1 puff into the lungs daily at 6 (six) AM. (Patient taking differently: Inhale 1 puff into the lungs as needed.), Disp: 60 each, Rfl: 5   Allergies  Allergen Reactions   Meloxicam Other (See Comments)    Felt horrible, headache, bloating, back pain, SHOB; no rash   Zithromax [Azithromycin] Swelling    Tongue    Pseudoephedrine-Guaifenesin Swelling    Of tongue   Shellfish Allergy Rash   Shellfish-Derived Products Rash     Social History   Tobacco Use  Smoking status: Never    Passive exposure: Never   Smokeless tobacco: Never  Vaping Use   Vaping status: Never Used  Substance Use Topics   Alcohol use: Not Currently    Alcohol/week: 0.0 standard drinks of alcohol   Drug use: No      Chart Review Today: I personally reviewed active problem list, medication list, allergies, family history, social history, health maintenance, notes from last encounter, lab results, imaging with the patient/caregiver today.   Review  of Systems  Constitutional: Negative.  Negative for chills, diaphoresis and fever.  HENT:  Positive for congestion. Negative for sore throat.   Eyes: Negative.   Respiratory: Negative.  Negative for cough (pressure).   Cardiovascular: Negative.  Negative for chest pain.  Gastrointestinal: Negative.  Negative for abdominal pain, anorexia, nausea and vomiting.  Endocrine: Negative.   Genitourinary: Negative.   Musculoskeletal: Negative.  Negative for arthralgias, joint swelling, myalgias and neck pain.  Skin: Negative.  Negative for rash.  Allergic/Immunologic: Negative.   Neurological:  Positive for dizziness and vertigo. Negative for weakness, numbness and headaches.  Hematological: Negative.   Psychiatric/Behavioral: Negative.    All other systems reviewed and are negative.      Objective:   Vitals:   08/18/23 1053  Resp: 16  SpO2: 99%  Weight: 265 lb (120.2 kg)  Height: 5\' 5"  (1.651 m)     Orthostatic VS for the past 72 hrs (Last 3 readings):  Orthostatic BP Patient Position BP Location Cuff Size Orthostatic Pulse  08/18/23 1059 128/76 Standing Right Arm Large 73  08/18/23 1058 130/78 Sitting Right Arm Large 72  08/18/23 1053 138/80 Supine Right Arm Large 73    Orthostatics reviewed and NEG   Body mass index is 44.1 kg/m.  Physical Exam Vitals and nursing note reviewed.  Constitutional:      General: She is not in acute distress.    Appearance: Normal appearance. She is well-developed. She is obese. She is not ill-appearing, toxic-appearing or diaphoretic.  HENT:     Head: Normocephalic and atraumatic.     Nose: Nose normal.  Eyes:     General:        Right eye: No discharge.        Left eye: No discharge.     Conjunctiva/sclera: Conjunctivae normal.  Neck:     Trachea: No tracheal deviation.  Cardiovascular:     Rate and Rhythm: Normal rate and regular rhythm.     Pulses: Normal pulses.     Heart sounds: Normal heart sounds.  Pulmonary:     Effort:  Pulmonary effort is normal. No respiratory distress.     Breath sounds: No stridor.  Skin:    General: Skin is warm and dry.     Findings: No rash.  Neurological:     Mental Status: She is alert.     Motor: No abnormal muscle tone.     Coordination: Coordination normal.     Comments: MENTAL STATUS: AAOx3, memory intact, fund of knowledge appropriate  LANG/SPEECH: Naming and repetition intact, fluent, no dysarthria, follows 3-step commands, answers questions appropriately  CRANIAL NERVES:   II: Pupils equal and reactive, no RAPD   III, IV, VI: EOM intact, no gaze preference or deviation, no nystagmus.   V: normal sensation in V1, V2, and V3 segments bilaterally   VII: no asymmetry, no nasolabial fold flattening   VIII: normal hearing to speech   IX, X: normal palatal elevation, no uvular deviation   XI:  5/5 head turn and 5/5 shoulder shrug bilaterally   XII: midline tongue protrusion  MOTOR:  5/5 bilateral grip strength 5/5 strength dorsiflexion/plantarflexion b/l  SENSORY:  Normal to light touch Romberg absent  COORD: Normal finger to nose and heel to shin, no tremor, no dysmetria  STATION: normal stance, no truncal ataxia  GAIT: Normal; patient able to tip-toe, heel-walk.    Psychiatric:        Mood and Affect: Affect is tearful.        Behavior: Behavior normal.          Results for orders placed or performed in visit on 03/16/23  HM DIABETES EYE EXAM   Collection Time: 03/16/23 12:00 AM  Result Value Ref Range   HM Diabetic Eye Exam No Retinopathy No Retinopathy       Assessment & Plan:     ICD-10-CM   1. Benign paroxysmal positional vertigo of right ear  H81.11 meclizine (ANTIVERT) 25 MG tablet    Ambulatory referral to Physical Therapy   reproducible with head movements, nonfocal neuro exam, meclizine, epley maneuver at home and PT consult for vestibular PT if no improvement    2. Left forearm pain  M79.632    eval by UC told her she had tendonitis  she has rested and done NSAIDs for weeks, no improvement, ortho consult          Danelle Berry, PA-C 08/18/23 11:21 AM

## 2023-09-03 DIAGNOSIS — M7712 Lateral epicondylitis, left elbow: Secondary | ICD-10-CM | POA: Diagnosis not present

## 2023-10-20 ENCOUNTER — Other Ambulatory Visit: Payer: Self-pay | Admitting: Family Medicine

## 2023-10-20 DIAGNOSIS — R928 Other abnormal and inconclusive findings on diagnostic imaging of breast: Secondary | ICD-10-CM

## 2023-10-24 ENCOUNTER — Other Ambulatory Visit: Payer: Self-pay | Admitting: Family Medicine

## 2023-10-24 DIAGNOSIS — E119 Type 2 diabetes mellitus without complications: Secondary | ICD-10-CM

## 2023-10-27 NOTE — Telephone Encounter (Signed)
 OV 08/18/23, 02/17/23 Requested Prescriptions  Pending Prescriptions Disp Refills   metFORMIN  (GLUCOPHAGE -XR) 750 MG 24 hr tablet [Pharmacy Med Name: METFORMIN  HCL ER 750 MG TABLET] 90 tablet 1    Sig: TAKE 1 TABLET (750) BY MOUTH ONCE DAILY WITH FOOD/MEAL     Endocrinology:  Diabetes - Biguanides Failed - 10/27/2023 12:15 PM      Failed - HBA1C is between 0 and 7.9 and within 180 days    Hgb A1c MFr Bld  Date Value Ref Range Status  11/10/2022 6.4 (H) <5.7 % of total Hgb Final    Comment:    For someone without known diabetes, a hemoglobin  A1c value between 5.7% and 6.4% is consistent with prediabetes and should be confirmed with a  follow-up test. . For someone with known diabetes, a value <7% indicates that their diabetes is well controlled. A1c targets should be individualized based on duration of diabetes, age, comorbid conditions, and other considerations. . This assay result is consistent with an increased risk of diabetes. . Currently, no consensus exists regarding use of hemoglobin A1c for diagnosis of diabetes for children. .          Failed - B12 Level in normal range and within 720 days    No results found for: "VITAMINB12"       Failed - Valid encounter within last 6 months    Recent Outpatient Visits           2 months ago Benign paroxysmal positional vertigo of right ear   [facility] Adeline Hone, PA-C              Passed - Cr in normal range and within 360 days    Creat  Date Value Ref Range Status  02/17/2023 0.72 0.50 - 1.03 mg/dL Final   Creatinine, Urine  Date Value Ref Range Status  05/05/2022 150 20 - 275 mg/dL Final         Passed - eGFR in normal range and within 360 days    GFR, Est African American  Date Value Ref Range Status  07/09/2020 119 > OR = 60 mL/min/1.25m2 Final   GFR, Est Non African American  Date Value Ref Range Status  07/09/2020 103 > OR = 60 mL/min/1.79m2 Final   GFR, Estimated   Date Value Ref Range Status  12/19/2021 >60 >60 mL/min Final    Comment:    (NOTE) Calculated using the CKD-EPI Creatinine Equation (2021)    eGFR  Date Value Ref Range Status  02/17/2023 101 > OR = 60 mL/min/1.62m2 Final         Passed - CBC within normal limits and completed in the last 12 months    WBC  Date Value Ref Range Status  02/17/2023 5.7 3.8 - 10.8 Thousand/uL Final   RBC  Date Value Ref Range Status  02/17/2023 5.24 (H) 3.80 - 5.10 Million/uL Final   Hemoglobin  Date Value Ref Range Status  02/17/2023 13.5 11.7 - 15.5 g/dL Final   HGB  Date Value Ref Range Status  11/30/2012 12.2 12.0 - 16.0 g/dL Final   HCT  Date Value Ref Range Status  02/17/2023 42.6 35.0 - 45.0 % Final  11/30/2012 36.6 35.0 - 47.0 % Final   MCHC  Date Value Ref Range Status  02/17/2023 31.7 (L) 32.0 - 36.0 g/dL Final   [facility]  Date Value Ref Range Status  02/17/2023 25.8 (L) 27.0 - 33.0 pg Final   MCV  Date Value Ref Range Status  02/17/2023 81.3 80.0 - 100.0 fL Final  11/30/2012 82 80 - 100 fL Final   No results found for: "PLTCOUNTKUC", "LABPLAT", "POCPLA" RDW  Date Value Ref Range Status  02/17/2023 13.7 11.0 - 15.0 % Final  11/30/2012 14.5 11.5 - 14.5 % Final

## 2023-10-29 ENCOUNTER — Other Ambulatory Visit: Payer: Self-pay | Admitting: Family Medicine

## 2023-10-29 DIAGNOSIS — E119 Type 2 diabetes mellitus without complications: Secondary | ICD-10-CM

## 2023-10-29 NOTE — Telephone Encounter (Signed)
 Copied from CRM [id number]. Topic: Clinical - Medication Refill >> Oct 29, 2023 12:00 PM Lesta Rater S wrote: Medication: metFORMIN  (GLUCOPHAGE -XR) 750 MG 24 hr tablet   Has the patient contacted their pharmacy? Yes (Agent: If no, request that the patient contact the pharmacy for the refill. If patient does not wish to contact the pharmacy document the reason why and proceed with request.) (Agent: If yes, when and what did the pharmacy advise?)  This is the patient's preferred pharmacy:   [facility] [phone] [place], [place] - [address] [place] [address] Phone: [phone] Fax: [phone]  Is this the correct pharmacy for this prescription? Yes If no, delete pharmacy and type the correct one.   Has the prescription been filled recently? Yes  Is the patient out of the medication? Yes  Has the patient been seen for an appointment in the last year OR does the patient have an upcoming appointment? Yes  Can we respond through MyChart? Yes  Agent: Please be advised that Rx refills may take up to 3 business days. We ask that you follow-up with your pharmacy.

## 2023-10-30 MED ORDER — METFORMIN HCL ER 750 MG PO TB24
ORAL_TABLET | ORAL | 0 refills | Status: DC
Start: 2023-10-30 — End: 2023-12-28

## 2023-10-30 NOTE — Telephone Encounter (Signed)
 Change of pharmacy  Requested Prescriptions  Pending Prescriptions Disp Refills   metFORMIN  (GLUCOPHAGE -XR) 750 MG 24 hr tablet 90 tablet 0    Sig: Take 1 tablet (750)  by mouth once daily with food/meal     Endocrinology:  Diabetes - Biguanides Failed - 10/30/2023  2:50 PM      Failed - HBA1C is between 0 and 7.9 and within 180 days    Hgb A1c MFr Bld  Date Value Ref Range Status  11/10/2022 6.4 (H) <5.7 % of total Hgb Final    Comment:    For someone without known diabetes, a hemoglobin  A1c value between 5.7% and 6.4% is consistent with prediabetes and should be confirmed with a  follow-up test. . For someone with known diabetes, a value <7% indicates that their diabetes is well controlled. A1c targets should be individualized based on duration of diabetes, age, comorbid conditions, and other considerations. . This assay result is consistent with an increased risk of diabetes. . Currently, no consensus exists regarding use of hemoglobin A1c for diagnosis of diabetes for children. .          Failed - B12 Level in normal range and within 720 days    No results found for: "VITAMINB12"       Failed - Valid encounter within last 6 months    Recent Outpatient Visits           2 months ago Benign paroxysmal positional vertigo of right ear   [facility] Adeline Hone, PA-C              Passed - Cr in normal range and within 360 days    Creat  Date Value Ref Range Status  02/17/2023 0.72 0.50 - 1.03 mg/dL Final   Creatinine, Urine  Date Value Ref Range Status  05/05/2022 150 20 - 275 mg/dL Final         Passed - eGFR in normal range and within 360 days    GFR, Est African American  Date Value Ref Range Status  07/09/2020 119 > OR = 60 mL/min/1.25m2 Final   GFR, Est Non African American  Date Value Ref Range Status  07/09/2020 103 > OR = 60 mL/min/1.78m2 Final   GFR, Estimated  Date Value Ref Range Status  12/19/2021 >60 >60  mL/min Final    Comment:    (NOTE) Calculated using the CKD-EPI Creatinine Equation (2021)    eGFR  Date Value Ref Range Status  02/17/2023 101 > OR = 60 mL/min/1.25m2 Final         Passed - CBC within normal limits and completed in the last 12 months    WBC  Date Value Ref Range Status  02/17/2023 5.7 3.8 - 10.8 Thousand/uL Final   RBC  Date Value Ref Range Status  02/17/2023 5.24 (H) 3.80 - 5.10 Million/uL Final   Hemoglobin  Date Value Ref Range Status  02/17/2023 13.5 11.7 - 15.5 g/dL Final   HGB  Date Value Ref Range Status  11/30/2012 12.2 12.0 - 16.0 g/dL Final   HCT  Date Value Ref Range Status  02/17/2023 42.6 35.0 - 45.0 % Final  11/30/2012 36.6 35.0 - 47.0 % Final   MCHC  Date Value Ref Range Status  02/17/2023 31.7 (L) 32.0 - 36.0 g/dL Final   [facility]  Date Value Ref Range Status  02/17/2023 25.8 (L) 27.0 - 33.0 pg Final   MCV  Date Value Ref Range Status  02/17/2023 81.3 80.0 - 100.0 fL Final  11/30/2012 82 80 - 100 fL Final   No results found for: "PLTCOUNTKUC", "LABPLAT", "POCPLA" RDW  Date Value Ref Range Status  02/17/2023 13.7 11.0 - 15.0 % Final  11/30/2012 14.5 11.5 - 14.5 % Final

## 2023-12-04 ENCOUNTER — Ambulatory Visit
Admission: RE | Admit: 2023-12-04 | Discharge: 2023-12-04 | Disposition: A | Discharge status: Home / Self Care | Source: Ambulatory Visit | Attending: Family Medicine

## 2023-12-04 ENCOUNTER — Ambulatory Visit: Payer: Self-pay | Admitting: Family Medicine

## 2023-12-04 DIAGNOSIS — R92323 Mammographic fibroglandular density, bilateral breasts: Secondary | ICD-10-CM | POA: Diagnosis not present

## 2023-12-04 DIAGNOSIS — R928 Other abnormal and inconclusive findings on diagnostic imaging of breast: Secondary | ICD-10-CM

## 2023-12-04 DIAGNOSIS — R921 Mammographic calcification found on diagnostic imaging of breast: Secondary | ICD-10-CM | POA: Diagnosis not present

## 2023-12-24 ENCOUNTER — Other Ambulatory Visit: Payer: Self-pay | Admitting: Family Medicine

## 2023-12-24 DIAGNOSIS — I10 Essential (primary) hypertension: Secondary | ICD-10-CM

## 2023-12-25 NOTE — Telephone Encounter (Signed)
 Requested medication (s) are due for refill today: yes  Requested medication (s) are on the active medication list: yes  Last refill:  11/25/22 #90 1 RF  Future visit scheduled: no   Notes to clinic:  sent pt message via MyChart to call office to make appt   Requested Prescriptions  Pending Prescriptions Disp Refills   amLODipine  (NORVASC ) 5 MG tablet [Pharmacy Med Name: AMLODIPINE  BESYLATE 5 MG TAB] 90 tablet 1    Sig: TAKE 1 TABLET BY MOUTH EVERY DAY IN THE EVENING     Cardiovascular: Calcium  Channel Blockers 2 Failed - 12/25/2023  3:11 PM      Failed - Valid encounter within last 6 months    Recent Outpatient Visits           4 months ago Benign paroxysmal positional vertigo of right ear   [facility] Leavy Mole, PA-C              Passed - Last BP in normal range    BP Readings from Last 1 Encounters:  07/31/23 136/89         Passed - Last Heart Rate in normal range    Pulse Readings from Last 1 Encounters:  07/31/23 75

## 2023-12-28 ENCOUNTER — Encounter: Payer: Self-pay | Admitting: Internal Medicine

## 2023-12-28 ENCOUNTER — Other Ambulatory Visit: Payer: Self-pay

## 2023-12-28 ENCOUNTER — Ambulatory Visit (INDEPENDENT_AMBULATORY_CARE_PROVIDER_SITE_OTHER): Admitting: Internal Medicine

## 2023-12-28 VITALS — BP 138/76 | Temp 98.0°F | Resp 16 | Ht 65.0 in | Wt 269.8 lb

## 2023-12-28 DIAGNOSIS — I10 Essential (primary) hypertension: Secondary | ICD-10-CM

## 2023-12-28 DIAGNOSIS — E782 Mixed hyperlipidemia: Secondary | ICD-10-CM

## 2023-12-28 DIAGNOSIS — E119 Type 2 diabetes mellitus without complications: Secondary | ICD-10-CM | POA: Diagnosis not present

## 2023-12-28 DIAGNOSIS — E559 Vitamin D deficiency, unspecified: Secondary | ICD-10-CM

## 2023-12-28 DIAGNOSIS — L299 Pruritus, unspecified: Secondary | ICD-10-CM

## 2023-12-28 DIAGNOSIS — J4530 Mild persistent asthma, uncomplicated: Secondary | ICD-10-CM

## 2023-12-28 MED ORDER — UMECLIDINIUM-VILANTEROL 62.5-25 MCG/ACT IN AEPB: 1.0000 | INHALATION_SPRAY | Freq: Every day | RESPIRATORY_TRACT | 5 refills | Status: AC

## 2023-12-28 MED ORDER — AMLODIPINE BESYLATE 5 MG PO TABS: ORAL_TABLET | ORAL | 1 refills | Status: AC

## 2023-12-28 MED ORDER — HYDROXYZINE HCL 10 MG PO TABS
10.0000 mg | ORAL_TABLET | Freq: Four times a day (QID) | ORAL | 2 refills | Status: AC | PRN
Start: 2023-12-28 — End: ?

## 2023-12-28 MED ORDER — ALBUTEROL SULFATE HFA 108 (90 BASE) MCG/ACT IN AERS: 1.0000 | INHALATION_SPRAY | RESPIRATORY_TRACT | 5 refills | Status: AC | PRN

## 2023-12-28 NOTE — Progress Notes (Signed)
 Established Patient Office Visit  Subjective   Patient ID: Alisha Kim, female    DOB: [birth year]  Age: 53 y.o. MRN: [id number]  Chief Complaint  Patient presents with   Medication Refill   Medical Management of Chronic Issues    HPI  Patient is here for follow up on chronic medical conditions.   Discussed the use of AI scribe software for clinical note transcription with the patient, who gave verbal consent to proceed.  History of Present Illness Alisha Kim is a 53 year old female with type 2 diabetes and hypertension who presents for medication management and follow-up.  She has been unable to obtain her metformin  prescription for several months, resulting in fasting blood sugar levels around 120 mg/dL, with a peak of 869 mg/dL. Her last A1c was 6.4% a year ago. She expresses concern about the long-term effects of metformin  due to a family history of kidney issues.  She takes amlodipine  5 mg at night for hypertension and is working on stress management to control her blood pressure, which was slightly elevated last month due to work stress.  She has asthma and uses a rescue inhaler as needed, though not recently. Her maintenance inhaler and hydroxyzine  have not been refilled since 2022. She is sensitive and prone to allergic reactions.  Her vitamin D  levels were low last year, and she has been taking prescription-strength vitamin D . She works from home and is considering exercise classes to improve her health.    Diabetes, Type 2: -Last A1c 6.4%  -Medications: Nothing currently - had been on Metformin  but cannot tolerate  -Checking BG at home: yes -Fasting home BG: 120-150 -Eye exam: UTD -Foot exam: Due at follow up -Microalbumin: Due -Statin: no -Denies symptoms of hypoglycemia, polyuria, polydipsia, numbness extremities, foot ulcers/trauma.   Hypertension: -Medications: Amlodipine  5 mg -Patient is compliant with above medications and reports no side  effects. 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 12/05/2021   Vitamin D  deficiency 05/22/2021   Mixed hyperlipidemia 07/09/2020   Current moderate episode of major depressive disorder (HCC) 02/17/2020   Grief reaction 02/17/2020   Morbid obesity (HCC) 05/31/2018   Allergic rhinitis 03/01/2018   Cervical stenosis of spinal canal 08/28/2017   S/P vaginal hysterectomy 09/29/2016   History of Helicobacter pylori infection 08/08/2016   Microcytosis 08/01/2016   Heel spur, left 04/07/2016   Bilateral foot pain 04/04/2016   Anxiety 04/04/2016   Hypertension, goal below 140/90 07/24/2015   Bilateral hip pain 11/17/2014   Atopic dermatitis 11/17/2014   Mild persistent asthma with allergic rhinitis without complication 11/17/2014   Past Medical History:  Diagnosis Date   Adenomyosis 09/2015   Allergy    Anemia    history of   Anxiety    controlled;    Asthma    Depression with anxiety    Diabetes mellitus without complication [facility])    was told she was pre-diabetic   Fibroids 08/14/2016   US  March 2018   History of Helicobacter pylori infection 08/08/2016   2004   History of kidney stones    when  in her 20's   Hydrosalpinx 08/14/2016   right   Hypertension    controlled with medication;    Mixed hyperlipidemia 07/09/2020   Pre-diabetes    Past Surgical History:  Procedure Laterality Date   ABDOMINAL HYSTERECTOMY     BREAST BIOPSY Right 12/02/2021   Stereo Bx, Coil Clip,  FLAT EPITHELIAL ATYPIA   BREAST BIOPSY Right 12/02/2021   Stereo Bx, X  clip,  FLAT EPITHELIAL ATYPIA   BREAST BIOPSY Right 07/01/2022   stereo bx, calcs, X clip- FLAT EPITHELIAL ATYPIA   BREAST BIOPSY Right 07/01/2022   MM RT BREAST BX W LOC DEV 1ST LESION IMAGE BX SPEC STEREO GUIDE 07/01/2022 [facility]   BREAST LUMPECTOMY WITH RADIO FREQUENCY LOCALIZER Right 12/25/2021   Procedure: BREAST LUMPECTOMY WITH RADIO FREQUENCY LOCALIZER;  Surgeon: Lane Shope, MD;  Location: [facility];  Service: General;  Laterality: Right;   BREAST LUMPECTOMY WITH RADIOFREQUENCY TAG IDENTIFICATION Right 12/19/2021   x 2 areas, bracketing   COLONOSCOPY WITH PROPOFOL  N/A 05/17/2018   Procedure: COLONOSCOPY WITH PROPOFOL ;  Surgeon: Unk Corinn Skiff, MD;  Location: [facility];  Service: Gastroenterology;  Laterality: N/A;   DENTAL SURGERY     NO PAST SURGERIES     POLYPECTOMY     TONSILLECTOMY     TUBAL LIGATION     VAGINAL HYSTERECTOMY Bilateral 09/29/2016   Procedure: HYSTERECTOMY VAGINAL WITH BILATERAL SALPINGECTOMY;  Surgeon: Archie Savers, MD;  Location: [facility] ORS;  Service: Gynecology;  Laterality: Bilateral;   Social History   Tobacco Use   Smoking status: Never    Passive exposure: Never   Smokeless tobacco: Never  Vaping Use   Vaping status: Never Used  Substance Use Topics   Alcohol use: Not Currently    Alcohol/week: 0.0 standard drinks of alcohol   Drug use: No   Social History   Socioeconomic History   Marital status: Married    Spouse name: Alisha Kim   Number of children: 1   Years of education: Not on file   Highest education level: Bachelor's degree (e.g., BA, AB, BS)  Occupational History   Not on file  Tobacco Use   Smoking status: Never    Passive exposure: Never   Smokeless tobacco: Never  Vaping Use   Vaping status: Never Used  Substance and Sexual Activity   Alcohol use: Not Currently    Alcohol/week: 0.0 standard drinks of alcohol   Drug use: No   Sexual activity: Yes    Partners: Male    Birth control/protection: None,  Surgical  Other Topics Concern   Not on file  Social History Narrative   [place] in [place]; 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 Never smoked; no alcohol. Alisha Kim- 51 [2023]   Social Drivers of Corporate investment banker Strain: Low Risk  (11/10/2022)   Overall Financial Resource Strain (CARDIA)    Difficulty of Paying Living Expenses: Not hard at all  Food Insecurity: No Food Insecurity (11/10/2022)   Hunger Vital Sign    Worried About Running Out of Food in the Last Year: Never true    Ran Out of Food in the Last Year: Never true  Transportation Needs: No Transportation Needs (11/10/2022)   PRAPARE - Administrator, Civil Service (Medical): No    Lack of Transportation (Non-Medical): No  Physical Activity: Insufficiently Active (11/10/2022)   Exercise Vital Sign    Days of Exercise per Week: 2 days    Minutes of Exercise per Session: 20 min  Stress: Stress  Concern Present (11/10/2022)   [employer] of Occupational Health - Occupational Stress Questionnaire    Feeling of Stress : Very much  Social Connections: Socially Isolated (11/10/2022)   Social Connection and Isolation Panel    Frequency of Communication with Friends and Family: Once a week    Frequency of Social Gatherings with Friends and Family: Once a week    Attends Religious Services: Never    Database administrator or Organizations: No    Attends Engineer, structural: Not on file    Marital Status: Married  Catering manager Violence: Not At Risk (02/24/2020)   Humiliation, Afraid, Rape, and Kick questionnaire    Fear of Current or Ex-Partner: No    Emotionally Abused: No    Physically Abused: No    Sexually Abused: No   Family Status  Relation Name Status   Mother  Deceased   Father  Deceased       lung cancer   Brother oldest Alive   Brother  Alive   MGM  Deceased       cervical   MGF  Deceased       unknown   PGM  Deceased       unknown   PGF  Deceased       unknown   Mat Aunt   Deceased       dementia  No partnership data on file   Family History  Problem Relation Age of Onset   Breast cancer Mother 101   Alcohol abuse Mother    Arthritis Mother    Asthma Mother    Depression Mother    Drug abuse Mother    Hypertension Mother    Mental illness Mother    Cancer Mother        breast and lung   Diabetes Mother    Alcohol abuse Father    Drug abuse Father    Cancer Father        lung   Hypertension Brother    Cancer Maternal Grandmother        cervical   Heart disease Maternal Grandmother    Hypertension Maternal Grandmother    Hearing loss Maternal Aunt    Hypertension Maternal Aunt    Stroke Maternal Aunt    Alzheimer's disease Maternal Aunt    Breast cancer Maternal Aunt        mat great aunt   Allergies  Allergen Reactions   Meloxicam  Other (See Comments)    Felt horrible, headache, bloating, back pain, SHOB; no rash   Zithromax [Azithromycin] Swelling    Tongue    Pseudoephedrine-Guaifenesin Swelling    Of tongue   Shellfish Allergy Rash   Shellfish-Derived Products Rash      Review of Systems  All other systems reviewed and are negative.     Objective:     BP 138/76 (Cuff Size: Large)   Temp 98 F (36.7 C) (Oral)   Resp 16   Ht 5' 5 (1.651 m)   Wt 269 lb 12.8 oz (122.4 kg)   LMP 09/29/2016 Comment: partial  SpO2 98%   BMI 44.90 kg/m  BP Readings from Last 3 Encounters:  12/28/23 138/76  07/31/23 136/89  03/19/23 (!) 139/94   Wt Readings from Last 3 Encounters:  12/28/23 269 lb 12.8 oz (122.4 kg)  08/18/23 265 lb (120.2 kg)  07/31/23 250 lb (113.4 kg)      Physical Exam Constitutional:      Appearance: Normal appearance.  HENT:     Head: Normocephalic and atraumatic.  Eyes:     Conjunctiva/sclera: Conjunctivae normal.  Cardiovascular:     Rate and Rhythm: Normal rate and regular rhythm.  Pulmonary:     Effort: Pulmonary effort is normal.     Breath sounds: Normal breath sounds.  Skin:    General: Skin  is warm and dry.  Neurological:     General: No focal deficit present.     Mental Status: She is alert. Mental status is at baseline.  Psychiatric:        Mood and Affect: Mood normal.        Behavior: Behavior normal.      No results found for any visits on 12/28/23.  Last CBC Lab Results  Component Value Date   WBC 5.7 02/17/2023   HGB 13.5 02/17/2023   HCT 42.6 02/17/2023   MCV 81.3 02/17/2023   MCH 25.8 (L) 02/17/2023   RDW 13.7 02/17/2023   PLT 271 02/17/2023   Last metabolic panel Lab Results  Component Value Date   GLUCOSE 90 02/17/2023   NA 139 02/17/2023   K 4.3 02/17/2023   CL 102 02/17/2023   CO2 30 02/17/2023   BUN 13 02/17/2023   CREATININE 0.72 02/17/2023   EGFR 101 02/17/2023   CALCIUM  9.3 02/17/2023   PROT 7.5 02/17/2023   ALBUMIN 3.7 12/19/2021   BILITOT 0.3 02/17/2023   ALKPHOS 131 (H) 12/19/2021   AST 27 02/17/2023   ALT 37 (H) 02/17/2023   ANIONGAP 9 12/19/2021   Last lipids Lab Results  Component Value Date   CHOL 223 (H) 05/05/2022   HDL 46 (L) 05/05/2022   LDLCALC 160 (H) 05/05/2022   TRIG 71 05/05/2022   CHOLHDL 4.8 05/05/2022   Last hemoglobin A1c Lab Results  Component Value Date   HGBA1C 6.4 (H) 11/10/2022   Last thyroid  functions Lab Results  Component Value Date   TSH 1.07 04/19/2018   Last vitamin D  Lab Results  Component Value Date   VD25OH 26 (L) 11/10/2022   Last vitamin B12 and Folate No results found for: VITAMINB12, FOLATE    The 10-year ASCVD risk score (Arnett DK, et al., 2019) is: 16.5%    Assessment & Plan:   Assessment & Plan Type 2 Diabetes Mellitus Off metformin  due to pharmacy issues. Fasting blood sugars around 120 mg/dL. Last A1c 6.4%, highest 6.8% in 2023. Prefers lifestyle management. - Order A1c, complete blood panel, cholesterol test, and diabetic urine test. - Discuss medication options if A1c indicates need for pharmacotherapy. - Schedule follow-up based on A1c  results.  Hypertension Blood pressure controlled at 138/76 mmHg on amlodipine  5 mg nightly. Goal <140/90 mmHg. - Continue amlodipine  5 mg at night.  Asthma Well-controlled. No recent use of maintenance inhaler. Keeps rescue inhaler available. - Refill Anoro Ellipta  maintenance inhaler and rescue inhaler.  Shellfish Allergy Severe reactions to shellfish. Hydroxyzine  effective for allergic reactions, anxiety, and sleep. - Refill hydroxyzine  prescription.  Vitamin D  Deficiency Low vitamin D  levels last year. On prescription-strength vitamin D . - Order vitamin D  level test.  General Health Maintenance Interested in exercise classes and [employer] app to reduce medication reliance. - Encourage lifestyle modifications including exercise and diet changes.  - HgB A1c - Urine Microalbumin w/creat. ratio - CBC w/Diff/Platelet - Comprehensive Metabolic Panel (CMET) - amLODipine  (NORVASC ) 5 MG tablet; TAKE 1 TABLET BY MOUTH EVERY DAY IN THE EVENING  Dispense: 90 tablet; Refill: 1 - Lipid Profile - umeclidinium-vilanterol (ANORO  ELLIPTA) 62.5-25 MCG/ACT AEPB; Inhale 1 puff into the lungs daily at 6 (six) AM.  Dispense: 60 each; Refill: 5 - albuterol  (PROAIR  HFA) 108 (90 Base) MCG/ACT inhaler; Inhale 1-2 puffs into the lungs every 4 (four) hours as needed for wheezing or shortness of breath.  Dispense: 1 each; Refill: 5 - Vitamin D  (25 hydroxy) - hydrOXYzine  (ATARAX ) 10 MG tablet; Take 1-2 tablets (10-20 mg total) by mouth every 6 (six) hours as needed for itching.  Dispense: 120 tablet; Refill: 2     Return in about 6 months (around 06/29/2024) for me.    Sharyle Fischer, DO

## 2023-12-29 DIAGNOSIS — E559 Vitamin D deficiency, unspecified: Secondary | ICD-10-CM | POA: Diagnosis not present

## 2023-12-29 DIAGNOSIS — E119 Type 2 diabetes mellitus without complications: Secondary | ICD-10-CM | POA: Diagnosis not present

## 2023-12-29 DIAGNOSIS — E782 Mixed hyperlipidemia: Secondary | ICD-10-CM | POA: Diagnosis not present

## 2023-12-29 DIAGNOSIS — I10 Essential (primary) hypertension: Secondary | ICD-10-CM | POA: Diagnosis not present

## 2023-12-30 LAB — CBC WITH DIFFERENTIAL/PLATELET
Absolute Lymphocytes: 1836 {cells}/uL (ref 850–3900)
Absolute Monocytes: 464 {cells}/uL (ref 200–950)
Basophils Absolute: 32 {cells}/uL (ref 0–200)
Basophils Relative: 0.6 %
Eosinophils Absolute: 140 {cells}/uL (ref 15–500)
Eosinophils Relative: 2.6 %
HCT: 42.3 % (ref 35.0–45.0)
Hemoglobin: 13 g/dL (ref 11.7–15.5)
MCH: 25.9 pg — ABNORMAL LOW (ref 27.0–33.0)
MCHC: 30.7 g/dL — ABNORMAL LOW (ref 32.0–36.0)
MCV: 84.4 fL (ref 80.0–100.0)
MPV: 10.8 fL (ref 7.5–12.5)
Monocytes Relative: 8.6 %
Neutro Abs: 2927 {cells}/uL (ref 1500–7800)
Neutrophils Relative %: 54.2 %
Platelets: 241 Thousand/uL (ref 140–400)
RBC: 5.01 Million/uL (ref 3.80–5.10)
RDW: 14 % (ref 11.0–15.0)
Total Lymphocyte: 34 %
WBC: 5.4 Thousand/uL (ref 3.8–10.8)

## 2023-12-30 LAB — COMPREHENSIVE METABOLIC PANEL WITH GFR
AG Ratio: 1.1 (calc) (ref 1.0–2.5)
ALT: 24 U/L (ref 6–29)
AST: 16 U/L (ref 10–35)
Albumin: 3.8 g/dL (ref 3.6–5.1)
Alkaline phosphatase (APISO): 143 U/L (ref 37–153)
BUN: 14 mg/dL (ref 7–25)
CO2: 29 mmol/L (ref 20–32)
Calcium: 8.8 mg/dL (ref 8.6–10.4)
Chloride: 103 mmol/L (ref 98–110)
Creat: 0.69 mg/dL (ref 0.50–1.03)
Globulin: 3.5 g/dL (ref 1.9–3.7)
Glucose, Bld: 113 mg/dL — ABNORMAL HIGH (ref 65–99)
Potassium: 4.6 mmol/L (ref 3.5–5.3)
Sodium: 139 mmol/L (ref 135–146)
Total Bilirubin: 0.3 mg/dL (ref 0.2–1.2)
Total Protein: 7.3 g/dL (ref 6.1–8.1)
eGFR: 104 mL/min/1.73m2 (ref >=60)

## 2023-12-30 LAB — MICROALBUMIN / CREATININE URINE RATIO
Creatinine, Urine: 109 mg/dL (ref 20–275)
Microalb Creat Ratio: 2 mg/g{creat} (ref <30)
Microalb, Ur: 0.2 mg/dL

## 2023-12-30 LAB — HEMOGLOBIN A1C
Hgb A1c MFr Bld: 6.6 % — ABNORMAL HIGH (ref <5.7)
Mean Plasma Glucose: 143 mg/dL
eAG (mmol/L): 7.9 mmol/L

## 2023-12-30 LAB — LIPID PANEL
Cholesterol: 205 mg/dL — ABNORMAL HIGH (ref <200)
HDL: 46 mg/dL — ABNORMAL LOW (ref >=50)
LDL Cholesterol (Calc): 143 mg/dL — ABNORMAL HIGH
Non-HDL Cholesterol (Calc): 159 mg/dL — ABNORMAL HIGH (ref <130)
Total CHOL/HDL Ratio: 4.5 (calc) (ref <5.0)
Triglycerides: 64 mg/dL (ref <150)

## 2023-12-30 LAB — VITAMIN D 25 HYDROXY (VIT D DEFICIENCY, FRACTURES): Vit D, 25-Hydroxy: 23 ng/mL — ABNORMAL LOW (ref 30–100)

## 2023-12-31 ENCOUNTER — Ambulatory Visit: Payer: Self-pay | Admitting: Internal Medicine

## 2023-12-31 ENCOUNTER — Other Ambulatory Visit: Payer: Self-pay | Admitting: Internal Medicine

## 2023-12-31 DIAGNOSIS — E782 Mixed hyperlipidemia: Secondary | ICD-10-CM

## 2023-12-31 DIAGNOSIS — E559 Vitamin D deficiency, unspecified: Secondary | ICD-10-CM

## 2023-12-31 MED ORDER — ROSUVASTATIN CALCIUM 10 MG PO TABS
10.0000 mg | ORAL_TABLET | Freq: Every day | ORAL | 3 refills | Status: AC
Start: 2023-12-31 — End: ?

## 2023-12-31 MED ORDER — VITAMIN D (ERGOCALCIFEROL) 1.25 MG (50000 UNIT) PO CAPS
50000.0000 [IU] | ORAL_CAPSULE | ORAL | 0 refills | Status: AC
Start: 2023-12-31 — End: ?

## 2024-01-02 ENCOUNTER — Encounter: Payer: Self-pay | Admitting: Internal Medicine

## 2024-01-04 ENCOUNTER — Other Ambulatory Visit: Payer: Self-pay | Admitting: Internal Medicine

## 2024-01-04 DIAGNOSIS — L299 Pruritus, unspecified: Secondary | ICD-10-CM

## 2024-01-05 NOTE — Telephone Encounter (Signed)
 Too soon for refill.  Requested Prescriptions  Pending Prescriptions Disp Refills   hydrOXYzine  (ATARAX ) 10 MG tablet [Pharmacy Med Name: HYDROXYZINE  HCL 10 MG TABLET] 720 tablet 1    Sig: TAKE 1-2 TABLETS (10-20 MG TOTAL) BY MOUTH EVERY 6 (SIX) HOURS AS NEEDED FOR ITCHING.     Ear, Nose, and Throat:  Antihistamines 2 Passed - 01/05/2024  4:03 PM      Passed - Cr in normal range and within 360 days    Creat  Date Value Ref Range Status  12/29/2023 0.69 0.50 - 1.03 mg/dL Final   Creatinine, Urine  Date Value Ref Range Status  12/29/2023 109 20 - 275 mg/dL Final         Passed - Valid encounter within last 12 months    Recent Outpatient Visits           1 week ago Type 2 diabetes mellitus without complication, without long-term current use of insulin [facility])   [place] [facility] Bernardo Fend, DO   4 months ago Benign paroxysmal positional vertigo of right ear   [facility] Leavy Mole, PA-C       Future Appointments             In 5 months Bernardo Fend, DO [facility]

## 2024-03-21 ENCOUNTER — Other Ambulatory Visit: Payer: Self-pay | Admitting: Internal Medicine

## 2024-03-21 DIAGNOSIS — E559 Vitamin D deficiency, unspecified: Secondary | ICD-10-CM

## 2024-03-22 NOTE — Telephone Encounter (Signed)
 Requested medications are due for refill today.  yes  Requested medications are on the active medications list.  yes  Last refill. 12/31/2023 #12 0 rf  Future visit scheduled.   yes  Notes to clinic.  Provider to review at this dosage.    Requested Prescriptions  Pending Prescriptions Disp Refills   Vitamin D , Ergocalciferol , (DRISDOL ) 1.25 MG (50000 UNIT) CAPS capsule [Pharmacy Med Name: VITAMIN D2 1.25MG (50,000 UNIT)] 12 capsule 0    Sig: Take 1 capsule (50,000 Units total) by mouth every 7 (seven) days.     Endocrinology:  Vitamins - Vitamin D  Supplementation 2 Failed - 03/22/2024  3:51 PM      Failed - Manual Review: Route requests for 50,000 IU strength to the provider      Failed - Vitamin D  in normal range and within 360 days    Vit D, 25-Hydroxy  Date Value Ref Range Status  12/29/2023 23 (L) 30 - 100 ng/mL Final    Comment:    Vitamin D  Status         25-OH Vitamin D : . Deficiency:                    <20 ng/mL Insufficiency:             20 - 29 ng/mL Optimal:                 > or = 30 ng/mL . For 25-OH Vitamin D  testing on patients on  D2-supplementation and patients for whom quantitation  of D2 and D3 fractions is required, the Quest[employer]ureD(TM) 25-OH VIT D, (D2,D3), LC/MS/MS is recommended: order  code 07111 (patients >62yrs). . See Note 1 . Note 1 . For additional information, please refer to  http://education.Quest[facility].com/faq/FAQ199  (This link is being provided for informational/ educational purposes only.)          Passed - Ca in normal range and within 360 days    Calcium   Date Value Ref Range Status  12/29/2023 8.8 8.6 - 10.4 mg/dL Final   Calcium , Total  Date Value Ref Range Status  11/30/2012 8.4 (L) 8.5 - 10.1 mg/dL Final         Passed - Valid encounter within last 12 months    Recent Outpatient Visits           2 months ago Type 2 diabetes mellitus without complication, without long-term current use of insulin [facility])   [facility] Bernardo Fend, DO   7 months ago Benign paroxysmal positional vertigo of right ear   [facility] Leavy Mole, PA-C       Future Appointments             In 3 months Bernardo Fend, DO [facility] [place]

## 2024-04-25 ENCOUNTER — Ambulatory Visit (INDEPENDENT_AMBULATORY_CARE_PROVIDER_SITE_OTHER): Admitting: Internal Medicine

## 2024-04-25 ENCOUNTER — Other Ambulatory Visit: Payer: Self-pay | Admitting: Internal Medicine

## 2024-04-25 ENCOUNTER — Other Ambulatory Visit: Payer: Self-pay

## 2024-04-25 VITALS — BP 128/80 | HR 97 | Temp 97.7°F | Resp 16 | Ht 65.0 in | Wt 276.8 lb

## 2024-04-25 DIAGNOSIS — R1030 Lower abdominal pain, unspecified: Secondary | ICD-10-CM | POA: Diagnosis not present

## 2024-04-25 DIAGNOSIS — R140 Abdominal distension (gaseous): Secondary | ICD-10-CM | POA: Diagnosis not present

## 2024-04-25 MED ORDER — SIMETHICONE 80 MG PO CHEW
80.0000 mg | CHEWABLE_TABLET | Freq: Four times a day (QID) | ORAL | 0 refills | Status: AC | PRN
Start: 2024-04-25 — End: ?

## 2024-04-25 NOTE — Progress Notes (Signed)
 Acute Office Visit  Subjective:     Patient ID: Alisha Kim, female    DOB: 27-Oct-1970, 53 y.o.   MRN: [id number]  Chief Complaint  Patient presents with   Gas    For 3 weeks    HPI Patient is in today for gas pain x 2-3 weeks.   Discussed the use of AI scribe software for clinical note transcription with the patient, who gave verbal consent to proceed.  History of Present Illness Alisha Kim is a 53 year old female who presents with gas pain and bloating for two to three weeks.  She experiences significant gas buildup and bloating with pain in the lower abdomen, radiating to the back and hips. The pain is sometimes severe enough to require her to lie down during work hours.  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 She is concerned about developing a dairy intolerance.  She uses Gas-X and herbal remedies, but Gas-X has not been effective. Bowel movements are irregular with episodes of constipation, attributed to decreased water intake during stress. She attempts to manage this by increasing water and herbal tea consumption and reducing coffee and soda intake.  She uses a heating pad for symptom management at home.   Review of Systems  Gastrointestinal:  Positive for abdominal pain and constipation. Negative for blood in stool, heartburn, melena, nausea and vomiting.        Objective:    BP 128/80 (Cuff Size: Large)   Pulse 97   Temp 97.7 F (36.5 C) (Oral)   Resp 16   Ht 5' 5 (1.651 m)   Wt 276 lb 12.8 oz (125.6 kg)   LMP 09/29/2016 Comment: partial  SpO2 98%   BMI 46.06 kg/m    Physical Exam Constitutional:      Appearance: Normal appearance.  HENT:     Head: Normocephalic and atraumatic.  Eyes:     Conjunctiva/sclera: Conjunctivae normal.  Cardiovascular:     Rate and Rhythm: Normal rate and regular rhythm.  Pulmonary:      Effort: Pulmonary effort is normal.     Breath sounds: Normal breath sounds.  Abdominal:     General: Bowel sounds are normal. There is no distension.     Palpations: Abdomen is soft.     Tenderness: There is no abdominal tenderness. There is no guarding or rebound.  Skin:    General: Skin is warm and dry.  Neurological:     General: No focal deficit present.     Mental Status: She is alert. Mental status is at baseline.  Psychiatric:        Mood and Affect: Mood normal.        Behavior: Behavior normal.     No results found for any visits on 04/25/24.      Assessment & Plan:   Assessment & Plan Lower abdominal pain with bloating and constipation Intermittent lower abdominal pain with bloating and constipation for 2-3 weeks, likely exacerbated by stress and dietary changes. Differential includes food intolerance or gluten sensitivity. - Increase hydration and dietary fiber intake gradually. - Consider ergonomic adjustments at work. - Prescribed simethicone  chewable tablets as needed. - Ordered food allergy panel and celiac panel. - Provided educational material on high-fiber foods. - Advised use of heating pad for gas pain relief.  - simethicone  (MYLICON) 80 MG chewable tablet; Chew 1 tablet (80 mg  total) by mouth every 6 (six) hours as needed for flatulence.  Dispense: 30 tablet; Refill: 0 - Food Allergy Profile - Celiac Disease Panel   Return for already scheduled.  Sharyle Fischer, DO

## 2024-04-25 NOTE — Patient Instructions (Addendum)
 Fiber Content in Foods Fiber is found in plant foods, such as fruits, vegetables, whole grains, nuts, seeds, and beans. If you have certain conditions, you may need to eat a high-fiber diet or a low-fiber diet. Your health care provider will tell you how much fiber you need. If you have problems or questions, contact your provider or dietitian. What foods are high in fiber?  Foods high in fiber have 4g of fiber or more per serving. Fruits Blackberries or raspberries (fresh) --  cup (75 g) has 4 g of fiber. Pear (fresh) -- 1 medium (180 g) has 5.5 g of fiber. Prunes (dried) -- 6 to 8 pieces (57-76 g) has 5 g of fiber. Apple with skin -- 1 medium (182 g) has 4.8 g of fiber. Guava -- 1 cup (128 g) has 8.9 g of fiber. Vegetables Peas (frozen) --  cup (80 g) has 4.4 g of fiber. Potato with skin (baked) -- 1 medium (173 g) has 4 g of fiber. Pumpkin (canned) --  cup (122 g) has 4 g of fiber. Sweet potato --  cup mashed (124 g) has 4 g of fiber. Winter squash -- 1 cup cooked (205 g) has 5.7 g of fiber. Grains Bran cereal --  cup (31 g) has 8.6 g of fiber. Bulgur (cooked) --  cup (70 g) has 4 g of fiber. Quinoa (cooked) -- 1 cup (185 g) has 5.2 g of fiber. Popcorn -- 3 cups (375 g) popped has 5.8 g of fiber. Spaghetti, whole wheat -- 1 cup (140 g) has 6 g of fiber. Oatmeal (cooked) -- 1 cup (234 g) has 4g of fiber. Meats and other proteins Pinto beans (cooked) --  cup (90 g) has 7.7 g of fiber. Lentils (cooked) --  cup (90 g) has 7.8 g of fiber. Kidney beans (canned) --  cup (92.5 g) has 5.7 g of fiber. Soybeans (canned, frozen, or fresh) --  cup (92.5 g) has 5.2 g of fiber. Baked beans, plain or vegetarian (canned) --  cup (130 g) has 5.2 g of fiber. Garbanzo beans or chickpeas (canned) --  cup (90 g) has 6.6 g of fiber. Black beans (cooked) --  cup (86 g) has 7.5 g of fiber. White beans or navy beans (cooked) --  cup (91 g) has 9.3 g of fiber. The items listed above may not  be a complete list of foods with high fiber. Actual amounts of fiber may be different depending on processing. Contact a dietitian for more information. What foods are moderate in fiber?  Moderate fiber foods have 1-3 g of fiber per serving. Fruits Banana -- 1 medium (126 g) has 3.2 g of fiber. Melon -- 1 cup (155 g) has 1.4 g of fiber. Orange -- 1 small (154 g) has 3.7 g of fiber. Raisins --  cup (40 g) has 1.8 g of fiber. Applesauce, sweetened --  cup (125 g) has 1.5 g of fiber. Blueberries (fresh) --  cup (75 g) has 1.8 g of fiber. Strawberries (fresh, sliced) -- 1 cup (150 g) has 3 g of fiber. Cherries -- 1 cup (140 g) has 2.9 g of fiber. Vegetables Broccoli (cooked) --  cup (77.5 g) has 2.1 g of fiber. Brussels sprouts (cooked) --  cup (78 g) has 3 g of fiber. Carrots (cooked) --  cup (77.5 g) has 2.2 g of fiber. Corn (canned or frozen) --  cup (82.5 g) has 2.1 g of fiber. Potatoes, mashed --  cup (105 g)  has 1.6 g of fiber. Tomato -- 1 medium (62 g) has 1.5 g of fiber. Green beans (canned) --  cup (83 g) has 2 g of fiber. Sweet potato, baked -- 1 medium (150 g) has 3 g of fiber. Cauliflower (cooked) -- 1/2 cup (90 g) has 2.3 g of fiber. Grains Long-grain brown rice (cooked) -- 1 cup (196 g) has 3.5 g of fiber. Bagel, plain -- one 4-inch (10 cm) bagel has 2 g of fiber. Instant oatmeal --  cup (120 g) has about 2 g of fiber. Macaroni noodles, enriched (cooked) -- 1 cup (140 g) has 2.5 g of fiber. Multigrain cereal --  cup (15 g) has about 2-4 g of fiber. Whole-wheat bread -- 1 slice (26 g) has 2 g of fiber. Whole-wheat spaghetti noodles --  cup (70 g) has 3.2 g of fiber. Corn tortilla -- one 6-inch (15 cm) tortilla has 1.5 g of fiber. Meats and other proteins Almonds --  cup or 1 oz (28 g) has 3.5 g of fiber. Sunflower seeds in shell --  cup or  oz (11.5 g) has 1.1 g of fiber. Vegetable or soy patty -- 1 patty (70 g) has 3.4 g of fiber. Walnuts --  cup or 1 oz  (30 g) has 2 g of fiber. Flax seed -- 1 Tbsp (7 g) has 2.8 g of fiber. The items listed above may not be a complete list of foods with moderate amounts of fiber. Actual amounts of fiber may be different depending on processing. Contact a dietitian for more information. What foods are low in fiber?  Low-fiber foods contain less than 1 g of fiber per serving. They include: Fruits Fruit juice --  cup or 4 fl oz (118 mL) has 0.5 g of fiber. Vegetables Lettuce -- 1 cup (35 g) has 0.5 g of fiber. Cucumber (slices) --  cup (60 g) has 0.3 g of fiber. Celery -- 1 stalk (40 g) has 0.1 g of fiber. Grains Flour tortilla -- one 6-inch (15 cm) tortilla has 0.5 g of fiber. White rice (cooked) --  cup (81.5 g) has 0.3 g of fiber. Meats and other proteins Egg -- 1 large (50 g) has 0 g of fiber. Meat, poultry, or fish -- 3 oz (85 g) has 0 g of fiber. Dairy Milk -- 1 cup or 8 fl oz (237 mL) has 0 g of fiber. Yogurt -- 1 cup (245 g) has 0 g of fiber. The items listed above may not be a complete list of foods that are low in fiber. Actual amounts of fiber may be different depending on processing. Contact a dietitian for more information. This information is not intended to replace advice given to you by your health care provider. Make sure you discuss any questions you have with your health care provider. Document Revised: 08/18/2022 Document Reviewed: 08/18/2022 [employer] Patient Education  2024 [employer].

## 2024-04-28 LAB — FOOD ALLERGY PROFILE
Allergen, Salmon, f41: 0.35 kU/L — ABNORMAL HIGH
Almonds: 0.22 kU/L — ABNORMAL HIGH
Brazil Nut: <0.1 kU/L
CLASS: 0
CLASS: 1
CLASS: 1
CLASS: 2
CLASS: 2
CLASS: 2
Cashew IgE: 0.11 kU/L — ABNORMAL HIGH
Class: 0
Class: 4
Egg White IgE: 0.25 kU/L — ABNORMAL HIGH
Fish Cod: 0.35 kU/L — ABNORMAL HIGH
Hazelnut: 0.1 kU/L — ABNORMAL HIGH
Macadamia Nut: 0.19 kU/L — ABNORMAL HIGH
Milk IgE: 0.11 kU/L — ABNORMAL HIGH
Peanut IgE: 0.28 kU/L — ABNORMAL HIGH
Scallop IgE: 1.33 kU/L — ABNORMAL HIGH
Sesame Seed f10: 0.83 kU/L — ABNORMAL HIGH
Shrimp IgE: 35.6 kU/L — ABNORMAL HIGH
Soybean IgE: 0.22 kU/L — ABNORMAL HIGH
Tuna IgE: 2.12 kU/L — ABNORMAL HIGH
Walnut: <0.1 kU/L
Wheat IgE: 0.27 kU/L — ABNORMAL HIGH

## 2024-04-28 LAB — PEANUT COMPONENT PANEL REFLEX
Ara h 1 (f422): 0.12 kU/L — ABNORMAL HIGH (ref <0.10)
Ara h 2 (f423): <0.1 kU/L (ref <0.10)
Ara h 3 (f424): 0.1 kU/L — ABNORMAL HIGH (ref <0.10)
Ara h 8 (f352): 0.12 kU/L — ABNORMAL HIGH (ref <0.10)
Ara h 9 (f427: <0.1 kU/L (ref <0.10)
F447-IgE Ara h 6: 0.1 kU/L — ABNORMAL HIGH (ref <0.10)

## 2024-04-28 LAB — MILK COMPONENT PANEL RFLX
Allergen, Alpha-lactalb,f76: <0.1 kU/L
Allergen, Beta-lactoglob,f77: <0.1 kU/L
Allergen, Casein, f78: 0.16 kU/L — ABNORMAL HIGH
CLASS: 0
Class: 0

## 2024-04-28 LAB — MISC CASHEW NUT: ANA O 3 IgE: <0.1 kU/L (ref <0.10)

## 2024-04-28 LAB — EGG COMPONENT PANEL REFLEX
Allergen, Ovalbumin, f232: <0.1 kU/L
Allergen, Ovomucoid, f233: <0.1 kU/L
CLASS: 0
CLASS: 0

## 2024-04-28 LAB — CELIAC DISEASE PANEL
(tTG) Ab, IgA: <1 U/mL
(tTG) Ab, IgG: 1.2 U/mL
Deamidated Gliadin Abs, IgG: <1 U/mL
Gliadin IgA: <1 U/mL
Immunoglobulin A: 443 mg/dL — ABNORMAL HIGH (ref 47–310)

## 2024-04-28 LAB — MISC HAZELNUT COMP PNL
Cor a1(f428): <0.1 kU/L (ref <0.10)
Cor a14(f439): <0.1 kU/L (ref <0.10)
Cor a8(f425): <0.1 kU/L (ref <0.10)
Cor a9(f440): <0.1 kU/L (ref <0.10)

## 2024-04-28 LAB — INTERPRETATION:

## 2024-04-30 ENCOUNTER — Ambulatory Visit: Payer: Self-pay | Admitting: Internal Medicine

## 2024-05-11 LAB — OPHTHALMOLOGY REPORT-SCANNED

## 2024-06-30 ENCOUNTER — Ambulatory Visit: Admitting: Internal Medicine

## 2024-07-19 ENCOUNTER — Ambulatory Visit: Admitting: Internal Medicine
